# Patient Record
Sex: Female | Born: 1970 | Race: White | Hispanic: No | Smoking: Former smoker
Health system: Southern US, Community
[De-identification: ages and names within clinical notes are randomized; demographics above are authoritative.]

## PROBLEM LIST (undated history)

## (undated) DIAGNOSIS — Z789 Other specified health status: Secondary | ICD-10-CM

## (undated) HISTORY — PX: NO PAST SURGERIES: SHX2092

## (undated) HISTORY — PX: BREAST EXCISIONAL BIOPSY: SUR124

---

## 1999-07-17 ENCOUNTER — Other Ambulatory Visit: Admission: RE | Admit: 1999-07-17 | Discharge: 1999-07-17 | Payer: Self-pay | Admitting: Obstetrics and Gynecology

## 2000-07-18 ENCOUNTER — Other Ambulatory Visit: Admission: RE | Admit: 2000-07-18 | Discharge: 2000-07-18 | Payer: Self-pay | Admitting: Obstetrics and Gynecology

## 2001-07-24 ENCOUNTER — Other Ambulatory Visit: Admission: RE | Admit: 2001-07-24 | Discharge: 2001-07-24 | Payer: Self-pay | Admitting: Obstetrics and Gynecology

## 2002-06-17 ENCOUNTER — Inpatient Hospital Stay (HOSPITAL_COMMUNITY): Admission: AD | Admit: 2002-06-17 | Discharge: 2002-06-19 | Payer: Self-pay | Admitting: Obstetrics and Gynecology

## 2002-07-15 ENCOUNTER — Other Ambulatory Visit: Admission: RE | Admit: 2002-07-15 | Discharge: 2002-07-15 | Payer: Self-pay | Admitting: Obstetrics and Gynecology

## 2004-01-31 ENCOUNTER — Other Ambulatory Visit: Admission: RE | Admit: 2004-01-31 | Discharge: 2004-01-31 | Payer: Self-pay | Admitting: Obstetrics and Gynecology

## 2005-04-15 ENCOUNTER — Other Ambulatory Visit: Admission: RE | Admit: 2005-04-15 | Discharge: 2005-04-15 | Payer: Self-pay | Admitting: Obstetrics and Gynecology

## 2011-09-25 ENCOUNTER — Encounter (HOSPITAL_COMMUNITY): Payer: Self-pay | Admitting: Pharmacist

## 2011-10-03 ENCOUNTER — Encounter (HOSPITAL_COMMUNITY): Payer: Self-pay

## 2011-10-03 ENCOUNTER — Encounter (HOSPITAL_COMMUNITY)
Admission: RE | Admit: 2011-10-03 | Discharge: 2011-10-03 | Disposition: A | Discharge status: Home / Self Care | Payer: Managed Care, Other (non HMO) | Source: Ambulatory Visit | Attending: Obstetrics and Gynecology | Admitting: Obstetrics and Gynecology

## 2011-10-03 HISTORY — DX: Other specified health status: Z78.9

## 2011-10-03 LAB — CBC
HCT: 41.5 % (ref 36.0–46.0)
Hemoglobin: 13.8 g/dL (ref 12.0–15.0)
MCH: 29.4 pg (ref 26.0–34.0)
MCHC: 33.3 g/dL (ref 30.0–36.0)
MCV: 88.3 fL (ref 78.0–100.0)

## 2011-10-03 NOTE — Patient Instructions (Addendum)
20 Angelica Dunn  10/03/2011   Your procedure is scheduled on:  10/09/11  Enter through the Main Entrance of [facility] at 1000 AM.  Pick up the phone at the desk and dial [phone].   Call this number if you have problems the morning of surgery: [phone]   Remember:   Do not eat food:After Midnight.  Do not drink clear liquids: After Midnight.  Take these medicines the morning of surgery with A SIP OF WATER: NA   Do not wear jewelry, make-up or nail polish.  Do not wear lotions, powders, or perfumes. You may wear deodorant.  Do not shave 48 hours prior to surgery.  Do not bring valuables to the hospital.  Contacts, dentures or bridgework may not be worn into surgery.  Leave suitcase in the car. After surgery it may be brought to your room.  For patients admitted to the hospital, checkout time is 11:00 AM the day of discharge.   Patients discharged the day of surgery will not be allowed to drive home.  Name and phone number of your driver: NA  Special Instructions: CHG Shower Use Special Wash: 1/2 bottle night before surgery and 1/2 bottle morning of surgery.   Please read over the following fact sheets that you were given: MRSA Information

## 2011-10-07 ENCOUNTER — Other Ambulatory Visit: Payer: Self-pay | Admitting: Obstetrics and Gynecology

## 2011-10-07 NOTE — H&P (Signed)
41 y.o. yo complains of sui.  She has some signs of mixed incontinence and has been on vessicare but still leaks with cough, sneeze and exercise.  It affects her daily life.  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  She also has dysmenorrhea and cystocele.  Past Medical History  Diagnosis Date  . No pertinent past medical history    Past Surgical History  Procedure Date  . No past surgeries     History   Social History  . Marital Status: Married    Spouse Name: N/A    Number of Children: N/A  . Years of Education: N/A   Occupational History  . Not on file.   Social History Main Topics  . Smoking status: Former Games developer  . Smokeless tobacco: Not on file  . Alcohol Use: Yes     occasional wine  . Drug Use: No  . Sexually Active:    Other Topics Concern  . Not on file   Social History Narrative  . No narrative on file    No current facility-administered medications on file prior to encounter.   No current outpatient prescriptions on file prior to encounter.    No Known Allergies  @VITALS2 @  Lungs: clear to ascultation Cor:  RRR Abdomen:  soft, nontender, nondistended. Ex:  no cords, erythema Pelvic:  nml uterus, mobile and decensus, cystocele -1 and 46 degree urethra.  U/S 7x4x4, normal ovaries  Cystometrics:  LPP 106, normal compliance, detrusor stable.  A:  SUI, cystocele, dysmenorrhea.   P:  For TVH, TVT, cysto and A repair.   All risks, benefits and alternatives d/w patient and she desires to proceed.  Patient will receive preop antibiotics and SCDs during the operation.     Buzz Axel A  Jahara Dail A

## 2011-10-08 MED ORDER — CEFAZOLIN SODIUM-DEXTROSE 2-3 GM-% IV SOLR: 2.0000 g | INTRAVENOUS | Status: DC

## 2011-10-09 ENCOUNTER — Encounter (HOSPITAL_COMMUNITY): Payer: Self-pay | Admitting: *Deleted

## 2011-10-09 ENCOUNTER — Ambulatory Visit (HOSPITAL_COMMUNITY): Payer: Managed Care, Other (non HMO)

## 2011-10-09 ENCOUNTER — Encounter (HOSPITAL_COMMUNITY): Payer: Self-pay

## 2011-10-09 ENCOUNTER — Encounter (HOSPITAL_COMMUNITY)
Admission: RE | Disposition: A | Discharge status: Home / Self Care | Payer: Self-pay | Source: Ambulatory Visit | Attending: Obstetrics and Gynecology

## 2011-10-09 ENCOUNTER — Ambulatory Visit (HOSPITAL_COMMUNITY)
Admission: RE | Admit: 2011-10-09 | Discharge: 2011-10-10 | Disposition: A | Discharge status: Home / Self Care | Payer: Managed Care, Other (non HMO) | Source: Ambulatory Visit | Attending: Obstetrics and Gynecology | Admitting: Obstetrics and Gynecology

## 2011-10-09 DIAGNOSIS — N946 Dysmenorrhea, unspecified: Secondary | ICD-10-CM | POA: Insufficient documentation

## 2011-10-09 DIAGNOSIS — Z9889 Other specified postprocedural states: Secondary | ICD-10-CM

## 2011-10-09 DIAGNOSIS — N920 Excessive and frequent menstruation with regular cycle: Secondary | ICD-10-CM | POA: Insufficient documentation

## 2011-10-09 DIAGNOSIS — N8111 Cystocele, midline: Secondary | ICD-10-CM | POA: Insufficient documentation

## 2011-10-09 DIAGNOSIS — N393 Stress incontinence (female) (male): Secondary | ICD-10-CM | POA: Insufficient documentation

## 2011-10-09 HISTORY — PX: CYSTOCELE REPAIR: SHX163

## 2011-10-09 HISTORY — PX: BLADDER SUSPENSION: SHX72

## 2011-10-09 HISTORY — PX: CYSTOSCOPY: SHX5120

## 2011-10-09 HISTORY — PX: VAGINAL HYSTERECTOMY: SHX2639

## 2011-10-09 SURGERY — TRANSVAGINAL TAPE (TVT) PROCEDURE
Anesthesia: General | Site: Vagina | Wound class: Clean Contaminated

## 2011-10-09 MED ORDER — MEPERIDINE HCL 25 MG/ML IJ SOLN: 6.2500 mg | INTRAMUSCULAR | Status: DC | PRN

## 2011-10-09 MED ORDER — ZOLPIDEM TARTRATE 5 MG PO TABS: 5.0000 mg | ORAL_TABLET | Freq: Every evening | ORAL | Status: DC | PRN

## 2011-10-09 MED ORDER — KETOROLAC TROMETHAMINE 30 MG/ML IJ SOLN: 30.0000 mg | Freq: Four times a day (QID) | INTRAMUSCULAR | Status: DC

## 2011-10-09 MED ORDER — DEXAMETHASONE SODIUM PHOSPHATE 4 MG/ML IJ SOLN
INTRAMUSCULAR | Status: DC | PRN
  Administered 2011-10-09: 10 mg via INTRAVENOUS

## 2011-10-09 MED ORDER — HYDROMORPHONE HCL PF 1 MG/ML IJ SOLN
INTRAMUSCULAR | Status: AC
  Administered 2011-10-09: 0.5 mg via INTRAVENOUS
  Filled 2011-10-09: qty 1

## 2011-10-09 MED ORDER — FENTANYL CITRATE 0.05 MG/ML IJ SOLN
INTRAMUSCULAR | Status: DC | PRN
  Administered 2011-10-09: 50 ug via INTRAVENOUS
  Administered 2011-10-09: 25 ug via INTRAVENOUS
  Administered 2011-10-09: 75 ug via INTRAVENOUS
  Administered 2011-10-09 (×2): 50 ug via INTRAVENOUS

## 2011-10-09 MED ORDER — NEOSTIGMINE METHYLSULFATE 1 MG/ML IJ SOLN
INTRAMUSCULAR | Status: AC
  Filled 2011-10-09: qty 10

## 2011-10-09 MED ORDER — INDIGOTINDISULFONATE SODIUM 8 MG/ML IJ SOLN
INTRAMUSCULAR | Status: AC
  Filled 2011-10-09: qty 5

## 2011-10-09 MED ORDER — IBUPROFEN 800 MG PO TABS
800.0000 mg | ORAL_TABLET | Freq: Three times a day (TID) | ORAL | Status: DC | PRN
  Administered 2011-10-09 – 2011-10-10 (×2): 800 mg via ORAL
  Filled 2011-10-09 (×2): qty 1

## 2011-10-09 MED ORDER — PROPOFOL 10 MG/ML IV EMUL
INTRAVENOUS | Status: AC
  Filled 2011-10-09: qty 20

## 2011-10-09 MED ORDER — FENTANYL CITRATE 0.05 MG/ML IJ SOLN
INTRAMUSCULAR | Status: AC
  Filled 2011-10-09: qty 2

## 2011-10-09 MED ORDER — MENTHOL 3 MG MT LOZG
1.0000 | LOZENGE | OROMUCOSAL | Status: DC | PRN
  Administered 2011-10-10: 3 mg via ORAL
  Filled 2011-10-09: qty 9

## 2011-10-09 MED ORDER — ROCURONIUM BROMIDE 50 MG/5ML IV SOLN
INTRAVENOUS | Status: AC
  Filled 2011-10-09: qty 1

## 2011-10-09 MED ORDER — GLYCOPYRROLATE 0.2 MG/ML IJ SOLN
INTRAMUSCULAR | Status: DC | PRN
  Administered 2011-10-09: 1 mg via INTRAVENOUS

## 2011-10-09 MED ORDER — BUTORPHANOL TARTRATE 1 MG/ML IJ SOLN
2.0000 mg | INTRAMUSCULAR | Status: DC | PRN
  Administered 2011-10-09 (×2): 2 mg via INTRAVENOUS
  Filled 2011-10-09 (×2): qty 2

## 2011-10-09 MED ORDER — INDIGOTINDISULFONATE SODIUM 8 MG/ML IJ SOLN
INTRAMUSCULAR | Status: DC | PRN
  Administered 2011-10-09: 40 mg via INTRAVENOUS

## 2011-10-09 MED ORDER — SCOPOLAMINE 1 MG/3DAYS TD PT72
MEDICATED_PATCH | TRANSDERMAL | Status: AC
  Filled 2011-10-09: qty 1

## 2011-10-09 MED ORDER — HYDROMORPHONE HCL PF 1 MG/ML IJ SOLN
0.2500 mg | INTRAMUSCULAR | Status: DC | PRN
  Administered 2011-10-09 (×3): 0.5 mg via INTRAVENOUS

## 2011-10-09 MED ORDER — LIDOCAINE-EPINEPHRINE (PF) 1 %-1:200000 IJ SOLN
INTRAMUSCULAR | Status: AC
  Filled 2011-10-09: qty 10

## 2011-10-09 MED ORDER — DOCUSATE SODIUM 100 MG PO CAPS: 100.0000 mg | ORAL_CAPSULE | Freq: Every day | ORAL | Status: DC

## 2011-10-09 MED ORDER — GLYCOPYRROLATE 0.2 MG/ML IJ SOLN
INTRAMUSCULAR | Status: AC
  Filled 2011-10-09: qty 3

## 2011-10-09 MED ORDER — ONDANSETRON HCL 4 MG PO TABS: 4.0000 mg | ORAL_TABLET | Freq: Four times a day (QID) | ORAL | Status: DC | PRN

## 2011-10-09 MED ORDER — MIDAZOLAM HCL 2 MG/2ML IJ SOLN
INTRAMUSCULAR | Status: AC
  Filled 2011-10-09: qty 2

## 2011-10-09 MED ORDER — PROPOFOL 10 MG/ML IV EMUL
INTRAVENOUS | Status: DC | PRN
  Administered 2011-10-09: 200 mg via INTRAVENOUS

## 2011-10-09 MED ORDER — STERILE WATER FOR IRRIGATION IR SOLN
Status: DC | PRN
  Administered 2011-10-09: 1500 mL via INTRAVESICAL

## 2011-10-09 MED ORDER — METOCLOPRAMIDE HCL 5 MG/ML IJ SOLN
INTRAMUSCULAR | Status: AC
  Administered 2011-10-09: 10 mg via INTRAVENOUS
  Filled 2011-10-09: qty 2

## 2011-10-09 MED ORDER — ESTRADIOL 0.1 MG/GM VA CREA
TOPICAL_CREAM | VAGINAL | Status: DC | PRN
  Administered 2011-10-09: 1 via VAGINAL

## 2011-10-09 MED ORDER — METOCLOPRAMIDE HCL 5 MG/ML IJ SOLN
10.0000 mg | Freq: Once | INTRAMUSCULAR | Status: AC | PRN
  Administered 2011-10-09: 10 mg via INTRAVENOUS

## 2011-10-09 MED ORDER — OXYCODONE-ACETAMINOPHEN 5-325 MG PO TABS
1.0000 | ORAL_TABLET | ORAL | Status: DC | PRN
  Administered 2011-10-09: 1 via ORAL
  Administered 2011-10-10: 2 via ORAL
  Administered 2011-10-10: 1 via ORAL
  Administered 2011-10-10: 2 via ORAL
  Filled 2011-10-09 (×3): qty 1
  Filled 2011-10-09: qty 2
  Filled 2011-10-09: qty 1

## 2011-10-09 MED ORDER — LIDOCAINE-EPINEPHRINE 0.5 %-1:200000 IJ SOLN
INTRAMUSCULAR | Status: AC
  Filled 2011-10-09: qty 1

## 2011-10-09 MED ORDER — LIDOCAINE HCL (CARDIAC) 20 MG/ML IV SOLN
INTRAVENOUS | Status: AC
  Filled 2011-10-09: qty 5

## 2011-10-09 MED ORDER — NEOSTIGMINE METHYLSULFATE 1 MG/ML IJ SOLN
INTRAMUSCULAR | Status: DC | PRN
  Administered 2011-10-09: 5 mg via INTRAVENOUS

## 2011-10-09 MED ORDER — CEFAZOLIN SODIUM-DEXTROSE 2-3 GM-% IV SOLR
INTRAVENOUS | Status: AC
  Administered 2011-10-09: 2 g via INTRAVENOUS
  Filled 2011-10-09: qty 50

## 2011-10-09 MED ORDER — LIDOCAINE-EPINEPHRINE 0.5 %-1:200000 IJ SOLN
INTRAMUSCULAR | Status: DC | PRN
  Administered 2011-10-09: 6 mL
  Administered 2011-10-09: 4 mL

## 2011-10-09 MED ORDER — ESTRADIOL 0.1 MG/GM VA CREA
TOPICAL_CREAM | VAGINAL | Status: AC
  Filled 2011-10-09: qty 42.5

## 2011-10-09 MED ORDER — DEXAMETHASONE SODIUM PHOSPHATE 10 MG/ML IJ SOLN
INTRAMUSCULAR | Status: AC
  Filled 2011-10-09: qty 1

## 2011-10-09 MED ORDER — MIDAZOLAM HCL 5 MG/5ML IJ SOLN
INTRAMUSCULAR | Status: DC | PRN
  Administered 2011-10-09: 2 mg via INTRAVENOUS

## 2011-10-09 MED ORDER — LIDOCAINE HCL (CARDIAC) 20 MG/ML IV SOLN
INTRAVENOUS | Status: DC | PRN
  Administered 2011-10-09: 20 mg via INTRAVENOUS

## 2011-10-09 MED ORDER — SCOPOLAMINE 1 MG/3DAYS TD PT72
1.0000 | MEDICATED_PATCH | TRANSDERMAL | Status: DC
  Administered 2011-10-09: 1.5 mg via TRANSDERMAL

## 2011-10-09 MED ORDER — HYDROMORPHONE HCL PF 1 MG/ML IJ SOLN
INTRAMUSCULAR | Status: AC
  Administered 2011-10-09: 0.5 mg
  Filled 2011-10-09: qty 1

## 2011-10-09 MED ORDER — KETOROLAC TROMETHAMINE 30 MG/ML IJ SOLN
INTRAMUSCULAR | Status: AC
  Filled 2011-10-09: qty 1

## 2011-10-09 MED ORDER — ONDANSETRON HCL 4 MG/2ML IJ SOLN
INTRAMUSCULAR | Status: DC | PRN
  Administered 2011-10-09: 4 mg via INTRAVENOUS

## 2011-10-09 MED ORDER — LACTATED RINGERS IV SOLN
INTRAVENOUS | Status: DC
  Administered 2011-10-09 (×4): via INTRAVENOUS

## 2011-10-09 MED ORDER — ROCURONIUM BROMIDE 100 MG/10ML IV SOLN
INTRAVENOUS | Status: DC | PRN
  Administered 2011-10-09: 35 mg via INTRAVENOUS
  Administered 2011-10-09: 15 mg via INTRAVENOUS

## 2011-10-09 MED ORDER — ONDANSETRON HCL 4 MG/2ML IJ SOLN: 4.0000 mg | Freq: Four times a day (QID) | INTRAMUSCULAR | Status: DC | PRN

## 2011-10-09 MED ORDER — FENTANYL CITRATE 0.05 MG/ML IJ SOLN
INTRAMUSCULAR | Status: AC
  Filled 2011-10-09: qty 5

## 2011-10-09 MED ORDER — ONDANSETRON HCL 4 MG/2ML IJ SOLN
INTRAMUSCULAR | Status: AC
  Filled 2011-10-09: qty 2

## 2011-10-09 MED ORDER — KETOROLAC TROMETHAMINE 30 MG/ML IJ SOLN
INTRAMUSCULAR | Status: DC | PRN
  Administered 2011-10-09: 30 mg via INTRAVENOUS

## 2011-10-09 SURGICAL SUPPLY — 43 items
BLADE SURG 15 STRL LF C SS BP (BLADE) ×2 IMPLANT
BLADE SURG 15 STRL SS (BLADE) ×1
CANISTER SUCTION 2500CC (MISCELLANEOUS) ×3 IMPLANT
CATH ROBINSON RED A/P 16FR (CATHETERS) ×3 IMPLANT
CLOTH BEACON ORANGE TIMEOUT ST (SAFETY) ×3 IMPLANT
CONT PATH 16OZ SNAP LID 3702 (MISCELLANEOUS) ×3 IMPLANT
DECANTER SPIKE VIAL GLASS SM (MISCELLANEOUS) ×3 IMPLANT
DERMABOND ADVANCED (GAUZE/BANDAGES/DRESSINGS) ×1
DERMABOND ADVANCED .7 DNX12 (GAUZE/BANDAGES/DRESSINGS) ×2 IMPLANT
DRAPE HYSTEROSCOPY (DRAPE) ×3 IMPLANT
FORMULA 20CAL 3 OZ MEAD (FORMULA) ×6 IMPLANT
GAUZE PACKING 2X5 YD STERILE (GAUZE/BANDAGES/DRESSINGS) ×3 IMPLANT
GLOVE BIO SURGEON STRL SZ7 (GLOVE) ×6 IMPLANT
GLOVE BIO SURGEON STRL SZ7.5 (GLOVE) ×6 IMPLANT
GLOVE BIOGEL PI IND STRL 6.5 (GLOVE) ×2 IMPLANT
GLOVE BIOGEL PI IND STRL 7.5 (GLOVE) ×2 IMPLANT
GLOVE BIOGEL PI INDICATOR 6.5 (GLOVE) ×1
GLOVE BIOGEL PI INDICATOR 7.5 (GLOVE) ×1
GOWN STRL REIN 2XL LVL4 (GOWN DISPOSABLE) ×3 IMPLANT
GOWN STRL REIN XL XLG (GOWN DISPOSABLE) ×15 IMPLANT
NEEDLE HYPO 22GX1.5 SAFETY (NEEDLE) ×3 IMPLANT
NS IRRIG 1000ML POUR BTL (IV SOLUTION) ×3 IMPLANT
PACK VAGINAL WOMENS (CUSTOM PROCEDURE TRAY) ×3 IMPLANT
PENCIL BUTTON HOLSTER BLD 10FT (ELECTRODE) ×3 IMPLANT
PLUG CATH AND CAP STER (CATHETERS) ×3 IMPLANT
SET CYSTO W/LG BORE CLAMP LF (SET/KITS/TRAYS/PACK) ×3 IMPLANT
SLING TRANS VAGINAL TAPE (Sling) ×1 IMPLANT
SLING UTERINE/ABD GYNECARE TVT (Sling) ×2 IMPLANT
SURGIFLO W/THROMBIN 8M KIT (HEMOSTASIS) ×3 IMPLANT
SUT VIC AB 0 CT1 18XCR BRD8 (SUTURE) ×4 IMPLANT
SUT VIC AB 0 CT1 8-18 (SUTURE) ×2
SUT VIC AB 2-0 CT1 (SUTURE) ×6 IMPLANT
SUT VIC AB 2-0 CT1 27 (SUTURE) ×2
SUT VIC AB 2-0 CT1 TAPERPNT 27 (SUTURE) ×4 IMPLANT
SUT VIC AB 2-0 SH 27 (SUTURE) ×2
SUT VIC AB 2-0 SH 27XBRD (SUTURE) ×4 IMPLANT
SUT VIC AB 2-0 UR6 27 (SUTURE) IMPLANT
SUT VICRYL 0 TIES 12 18 (SUTURE) ×3 IMPLANT
SYR 50ML LL SCALE MARK (SYRINGE) ×3 IMPLANT
SYRINGE TOOMEY DISP (SYRINGE) ×3 IMPLANT
TOWEL OR 17X24 6PK STRL BLUE (TOWEL DISPOSABLE) ×6 IMPLANT
TRAY FOLEY CATH 14FR (SET/KITS/TRAYS/PACK) ×3 IMPLANT
WATER STERILE IRR 1000ML POUR (IV SOLUTION) ×3 IMPLANT

## 2011-10-09 NOTE — Anesthesia Procedure Notes (Signed)
Procedure Name: Intubation Date/Time: 10/09/2011 1:35 PM Performed by: Lincoln Brigham Pre-anesthesia Checklist: Patient identified, Timeout performed, Emergency Drugs available, Suction available and Patient being monitored Patient Re-evaluated:Patient Re-evaluated prior to inductionOxygen Delivery Method: Circle system utilized Preoxygenation: Pre-oxygenation with 100% oxygen Intubation Type: IV induction Ventilation: Mask ventilation without difficulty Grade View: Grade I Tube type: Oral Tube size: 7.0 mm Airway Equipment and Method: Stylet Placement Confirmation: ETT inserted through vocal cords under direct vision,  positive ETCO2 and breath sounds checked- equal and bilateral Secured at: 22 cm Tube secured with: Tape Dental Injury: Teeth and Oropharynx as per pre-operative assessment

## 2011-10-09 NOTE — Brief Op Note (Signed)
10/09/2011  2:59 PM  PATIENT:  Angelica Dunn  41 y.o. female  PRE-OPERATIVE DIAGNOSIS:  Stress Urinary Incontinence; Cystocele  POST-OPERATIVE DIAGNOSIS:  Stress Urinary Incontinence; Cystocele  PROCEDURE:  Procedure(s) (LRB): TRANSVAGINAL TAPE (TVT) PROCEDURE (N/A) ANTERIOR REPAIR (CYSTOCELE) (N/A) HYSTERECTOMY VAGINAL (N/A) CYSTOSCOPY (N/A)  SURGEON:  Surgeon(s) and Role:    * Miriam Kestler A Jah Alarid, MD - Primary    * Allan Ross, MD - Assisting  ANESTHESIA:   general  EBL:  Total I/O In: 2000 [I.V.:2000] Out: 250 [Blood:250]   SPECIMEN:  Source of Specimen:  uterus, cervix  DISPOSITION OF SPECIMEN:  PATHOLOGY  COUNTS:  YES  PLAN OF CARE: Admit for overnight observation  PATIENT DISPOSITION:  PACU - hemodynamically stable.   Delay start of Pharmacological VTE agent (>24hrs) due to surgical blood loss or risk of bleeding: na  Meds:  Similac in bladder, lidocaine with epi, indigo carmine. Surgiflo and estrace.  Findings: cystocele to -1, 7 week size uterus and normal ovaries.  Technique:  After general anesthesia was achieved, the patient was prepped and draped in a sterile fashion.  The bladder was emptied with a red rubber catheter and 60 cc of Similac was placed in the bladder. The cervix was grasped with a pair of Lahey clamps and injected circumferentially with 1% lidocaine with epi. A circumferential incision was made around the cervix with the scalpel at the level of the reflection of the vagina onto the cervix and the posterior cul-de-sac was entered into with Mayo scissors. The long billed duckbill retractor was then placed and the bladder was removed off the cervix carefully with sharp dissection with the Metzenbaums. The the uterosacrals were grasped with a pair heney clamps on either side and secured with a Heaney stitch of 0 Vicryl. Cardinal ligament was then divided with alternating successive bites of the Heaney clamp followed by incision with the Mayo scissors  and secured with stitches of 0 Vicryl at the level of the cornua Heaneys were placed bilaterally around the entire pedicle and the uterus was able to be amputated. The pedicles were secured with a free hand stitch of 0 Vicryl followed by a stitch of 0 Vicryl bilaterally. An additional stitch on the patient's left-hand side was placed to ensure hemostasis. Once hemostasis was achieved the peritoneum was closed in a pursestring fashion including the bilateral uterosacrals and posteriorly in and out of the vagina and a partial Halbans culdoplasty.  The stitch was pulled shot closing the peritoneum and pulling the uterosacrals together through the modified Hall bands and through the vagina. The Foley was placed at this point.  The anterior vaginal mucosa was then entered into in the midline with the help of Alice's after being injected with 1% lidocaine with epi with the scalpel. The vaginal mucosa was then reflected off of the vesicouterine fascia with careful sharp dissection with the Metzenbaums until the pelvic floor could feel be felt underneath the pubic bone. 2 small stab incisions are made on 2 cm either side of the midline just above the pubic bone. The abdominal needles were placed carefully through the pelvic floor and out the vagina, following close behind the pubic bone. Cystoscopy was performed and revealed bilateral spillage of indigo carmine and no needles in the bladder.  A Kelly was then used to ensure that the there was no tension placed on the tape and the abdominal needles pulled the tape in place. Once I was satisfied that the sling was in the correct place and at   no tension, the anterior repair was done with 2 mattress stitches of 0 Vicryl. Hemostasis was achieved with the Bovie cautery and some Surgiflo. The vaginal mucosa was trimmed and closed with a running locked stitch of 2-0 Vicryl. The cuff was then closed with interrupted figure-of-eight stitches of 0 Vicryl. A vaginal pack was placed  inside the vagina with Estrace cream and the Foley had been replaced patient tolerated the procedure was returned to recovery in stable condition.  Kellen Hover A       

## 2011-10-09 NOTE — Anesthesia Preprocedure Evaluation (Addendum)
Anesthesia Evaluation  Patient identified by MRN, date of birth, ID band Patient awake    Reviewed: Allergy & Precautions, H&P , NPO status , Patient's Chart, lab work & pertinent test results  Airway Mallampati: II TM Distance: >3 FB Neck ROM: full    Dental No notable dental hx. (+) Teeth Intact   Pulmonary neg pulmonary ROS,  breath sounds clear to auscultation  Pulmonary exam normal       Cardiovascular negative cardio ROS  Rhythm:regular Rate:Normal     Neuro/Psych negative neurological ROS  negative psych ROS   GI/Hepatic negative GI ROS, Neg liver ROS,   Endo/Other  negative endocrine ROS  Renal/GU   negative genitourinary   Musculoskeletal   Abdominal Normal abdominal exam  (+)   Peds  Hematology negative hematology ROS (+)   Anesthesia Other Findings   Reproductive/Obstetrics negative OB ROS                           Anesthesia Physical Anesthesia Plan  ASA: I  Anesthesia Plan: General ETT   Post-op Pain Management:    Induction: Intravenous  Airway Management Planned: Oral ETT  Additional Equipment:   Intra-op Plan:   Post-operative Plan: Extubation in OR  Informed Consent: I have reviewed the patients History and Physical, chart, labs and discussed the procedure including the risks, benefits and alternatives for the proposed anesthesia with the patient or authorized representative who has indicated his/her understanding and acceptance.   Dental advisory given  Plan Discussed with: Anesthesiologist, CRNA and Surgeon  Anesthesia Plan Comments:        Anesthesia Quick Evaluation

## 2011-10-09 NOTE — Transfer of Care (Signed)
Immediate Anesthesia Transfer of Care Note  Patient: Angelica Dunn  Procedure(s) Performed: Procedure(s) (LRB): TRANSVAGINAL TAPE (TVT) PROCEDURE (N/A) ANTERIOR REPAIR (CYSTOCELE) (N/A) HYSTERECTOMY VAGINAL (N/A) CYSTOSCOPY (N/A)  Patient Location: PACU  Anesthesia Type: General  Level of Consciousness: awake, alert  and oriented  Airway & Oxygen Therapy: Patient Spontanous Breathing and Patient connected to nasal cannula oxygen  Post-op Assessment: Report given to PACU RN and Post -op Vital signs reviewed and stable  Post vital signs: Reviewed and stable  Complications: No apparent anesthesia complications

## 2011-10-09 NOTE — Progress Notes (Signed)
There has been no change in the patients history, status or exam since the history and physical.  Filed Vitals:   10/09/11 1004  BP: 107/66  Pulse: 58  Temp: 99.1 F (37.3 C)  TempSrc: Oral  Resp: 16  SpO2: 100%    Lab Results  Component Value Date   WBC 7.7 10/03/2011   HGB 13.8 10/03/2011   HCT 41.5 10/03/2011   MCV 88.3 10/03/2011   PLT 274 10/03/2011    Ariane Ditullio A

## 2011-10-09 NOTE — Progress Notes (Signed)
Patient is eating, ambulating, not voiding-foley in.  Pain control is good.  BP 122/78  Pulse 75  Temp 98.2 F (36.8 C) (Oral)  Resp 20  SpO2 100%    Lab Results  Component Value Date   WBC 7.7 10/03/2011   HGB 13.8 10/03/2011   HCT 41.5 10/03/2011   MCV 88.3 10/03/2011   PLT 274 10/03/2011    A/P  Routine care.  Expect d/c per plan.

## 2011-10-09 NOTE — Op Note (Signed)
10/09/2011  2:59 PM  PATIENT:  Angelica Dunn  41 y.o. female  PRE-OPERATIVE DIAGNOSIS:  Stress Urinary Incontinence; Cystocele  POST-OPERATIVE DIAGNOSIS:  Stress Urinary Incontinence; Cystocele  PROCEDURE:  Procedure(s) (LRB): TRANSVAGINAL TAPE (TVT) PROCEDURE (N/A) ANTERIOR REPAIR (CYSTOCELE) (N/A) HYSTERECTOMY VAGINAL (N/A) CYSTOSCOPY (N/A)  SURGEON:  Surgeon(s) and Role:    * Loney Laurence, MD - Primary    * Miguel Aschoff, MD - Assisting  ANESTHESIA:   general  EBL:  Total I/O In: 2000 [I.V.:2000] Out: 250 [Blood:250]   SPECIMEN:  Source of Specimen:  uterus, cervix  DISPOSITION OF SPECIMEN:  PATHOLOGY  COUNTS:  YES  PLAN OF CARE: Admit for overnight observation  PATIENT DISPOSITION:  PACU - hemodynamically stable.   Delay start of Pharmacological VTE agent (>24hrs) due to surgical blood loss or risk of bleeding: na  Meds:  Similac in bladder, lidocaine with epi, indigo carmine. Surgiflo and estrace.  Findings: cystocele to -1, 7 week size uterus and normal ovaries.  Technique:  After general anesthesia was achieved, the patient was prepped and draped in a sterile fashion.  The bladder was emptied with a red rubber catheter and 60 cc of Similac was placed in the bladder. The cervix was grasped with a pair of Lahey clamps and injected circumferentially with 1% lidocaine with epi. A circumferential incision was made around the cervix with the scalpel at the level of the reflection of the vagina onto the cervix and the posterior cul-de-sac was entered into with Mayo scissors. The long billed duckbill retractor was then placed and the bladder was removed off the cervix carefully with sharp dissection with the Metzenbaums. The the uterosacrals were grasped with a pair heney clamps on either side and secured with a Heaney stitch of 0 Vicryl. Cardinal ligament was then divided with alternating successive bites of the Heaney clamp followed by incision with the Mayo scissors  and secured with stitches of 0 Vicryl at the level of the cornua Heaneys were placed bilaterally around the entire pedicle and the uterus was able to be amputated. The pedicles were secured with a free hand stitch of 0 Vicryl followed by a stitch of 0 Vicryl bilaterally. An additional stitch on the patient's left-hand side was placed to ensure hemostasis. Once hemostasis was achieved the peritoneum was closed in a pursestring fashion including the bilateral uterosacrals and posteriorly in and out of the vagina and a partial Halbans culdoplasty.  The stitch was pulled shot closing the peritoneum and pulling the uterosacrals together through the modified Hall bands and through the vagina. The Foley was placed at this point.  The anterior vaginal mucosa was then entered into in the midline with the help of Alice's after being injected with 1% lidocaine with epi with the scalpel. The vaginal mucosa was then reflected off of the vesicouterine fascia with careful sharp dissection with the Metzenbaums until the pelvic floor could feel be felt underneath the pubic bone. 2 small stab incisions are made on 2 cm either side of the midline just above the pubic bone. The abdominal needles were placed carefully through the pelvic floor and out the vagina, following close behind the pubic bone. Cystoscopy was performed and revealed bilateral spillage of indigo carmine and no needles in the bladder.  A Tresa Endo was then used to ensure that the there was no tension placed on the tape and the abdominal needles pulled the tape in place. Once I was satisfied that the sling was in the correct place and at  no tension, the anterior repair was done with 2 mattress stitches of 0 Vicryl. Hemostasis was achieved with the Bovie cautery and some Surgiflo. The vaginal mucosa was trimmed and closed with a running locked stitch of 2-0 Vicryl. The cuff was then closed with interrupted figure-of-eight stitches of 0 Vicryl. A vaginal pack was placed  inside the vagina with Estrace cream and the Foley had been replaced patient tolerated the procedure was returned to recovery in stable condition.  Koda Defrank A

## 2011-10-09 NOTE — Anesthesia Postprocedure Evaluation (Signed)
  Anesthesia Post-op Note  Patient: Angelica Dunn  Procedure(s) Performed: Procedure(s) (LRB): TRANSVAGINAL TAPE (TVT) PROCEDURE (N/A) ANTERIOR REPAIR (CYSTOCELE) (N/A) HYSTERECTOMY VAGINAL (N/A) CYSTOSCOPY (N/A)  Patient Location: PACU  Anesthesia Type: General  Level of Consciousness: awake, alert  and oriented  Airway and Oxygen Therapy: Patient Spontanous Breathing  Post-op Pain: mild  Post-op Assessment: Post-op Vital signs reviewed, Patient's Cardiovascular Status Stable, Respiratory Function Stable, Patent Airway, No signs of Nausea or vomiting and Pain level controlled  Post-op Vital Signs: Reviewed and stable  Complications: No apparent anesthesia complications

## 2011-10-10 ENCOUNTER — Encounter (HOSPITAL_COMMUNITY): Payer: Self-pay | Admitting: Obstetrics and Gynecology

## 2011-10-10 LAB — CBC
Hemoglobin: 11.5 g/dL — ABNORMAL LOW (ref 12.0–15.0)
MCH: 29.3 pg (ref 26.0–34.0)
MCV: 88.3 fL (ref 78.0–100.0)
Platelets: 225 10*3/uL (ref 150–400)
RBC: 3.92 MIL/uL (ref 3.87–5.11)
WBC: 18.5 10*3/uL — ABNORMAL HIGH (ref 4.0–10.5)

## 2011-10-10 MED ORDER — CEFUROXIME AXETIL 250 MG PO TABS: 250.0000 mg | ORAL_TABLET | Freq: Two times a day (BID) | ORAL | Status: AC

## 2011-10-10 MED ORDER — OXYCODONE-ACETAMINOPHEN 5-325 MG PO TABS: 1.0000 | ORAL_TABLET | ORAL | Status: AC | PRN

## 2011-10-10 NOTE — Progress Notes (Signed)
Patient is eating, ambulating, foley in place.  Pain control is good.  BP 114/68  Pulse 92  Temp 98.6 F (37 C) (Oral)  Resp 18  Ht 5\' 2"  (1.575 m)  Wt 55.792 kg (123 lb)  BMI 22.50 kg/m2  SpO2 100%  lungs:   clear to auscultation cor:    RRR Abdomen:  soft, appropriate tenderness, incisions intact and without erythema or exudate. ex:    no cords   Lab Results  Component Value Date   WBC 18.5* 10/10/2011   HGB 11.5* 10/10/2011   HCT 34.6* 10/10/2011   MCV 88.3 10/10/2011   PLT 225 10/10/2011    A/P  Routine care.  Expect d/c per plan.

## 2011-10-10 NOTE — Progress Notes (Signed)
Pt out in wheelchair  Husband   With pt     Teaching complete

## 2011-10-10 NOTE — Addendum Note (Signed)
Addendum  created 10/10/11 1048 by Algis Greenhouse, CRNA   Modules edited:Notes Section

## 2011-10-10 NOTE — Discharge Summary (Signed)
Physician Discharge Summary  Patient ID: Angelica Dunn MRN: [id number] DOB/AGE: 41-01-1971 41 y.o.  Admit date: 10/09/2011 Discharge date: 10/10/2011  Admission Diagnoses:SUI, cystocele, menorrhagia  Discharge Diagnoses: same Active Problems:  * No active hospital problems. *    Discharged Condition: good  Hospital Course: uncomplicated postoperative course after surgery.  Home with foley and antibiotics.    Consults: None  Significant Diagnostic Studies: labs:   Lab Results  Component Value Date   WBC 18.5* 10/10/2011   HGB 11.5* 10/10/2011   HCT 34.6* 10/10/2011   MCV 88.3 10/10/2011   PLT 225 10/10/2011     Treatments: surgery: TVH/TVT/A repair/cysto  Discharge Exam: Blood pressure 114/68, pulse 92, temperature 98.6 F (37 C), temperature source Oral, resp. rate 18, height 5\' 2"  (1.575 m), weight 55.792 kg (123 lb), SpO2 100.00%.   Disposition:   Discharge Orders    Future Orders Please Complete By Expires   Diet - low sodium heart healthy      Discharge instructions      Comments:   No driving on narcotics, no sexual activity for 2 weeks.   Increase activity slowly      May shower / Bathe      Comments:   Shower, no bath for 2 weeks.   Sexual Activity Restrictions      Comments:   No sexual activity for 2 weeks.   Remove dressing in 24 hours      Call MD for:  temperature >100.4        Medication List  As of 10/10/2011  7:56 AM   TAKE these medications         docusate sodium 100 MG capsule   Commonly known as: COLACE   Take 100 mg by mouth daily.      oxyCODONE-acetaminophen 5-325 MG per tablet   Commonly known as: PERCOCET/ROXICET   Take 1-2 tablets by mouth every 4 (four) hours as needed (moderate to severe pain (when tolerating fluids)).           Follow-up Information    Follow up with Curtez Brallier A, MD. Schedule an appointment as soon as possible for a visit in 3 days. (pt has appt mon for voiding trial)    Contact information:   [address] [place] [address] [phone]          Signed: Loney Laurence 10/10/2011, 7:56 AM

## 2011-10-10 NOTE — Progress Notes (Signed)
Vag pack removed.

## 2011-10-10 NOTE — Anesthesia Postprocedure Evaluation (Signed)
  Anesthesia Post-op Note  Patient: Angelica Dunn  Procedure(s) Performed: Procedure(s) (LRB): TRANSVAGINAL TAPE (TVT) PROCEDURE (N/A) ANTERIOR REPAIR (CYSTOCELE) (N/A) HYSTERECTOMY VAGINAL (N/A) CYSTOSCOPY (N/A)  Patient Location: [facility]  Anesthesia Type: General  Level of Consciousness: awake  Airway and Oxygen Therapy: Patient Spontanous Breathing  Post-op Pain: mild  Post-op Assessment: Post-op Vital signs reviewed  Post-op Vital Signs: stable  Complications: No apparent anesthesia complications

## 2012-12-15 ENCOUNTER — Other Ambulatory Visit: Payer: Self-pay | Admitting: Obstetrics and Gynecology

## 2014-04-19 ENCOUNTER — Other Ambulatory Visit: Payer: Self-pay | Admitting: Obstetrics and Gynecology

## 2014-04-19 DIAGNOSIS — N644 Mastodynia: Secondary | ICD-10-CM

## 2014-05-12 ENCOUNTER — Ambulatory Visit
Admission: RE | Admit: 2014-05-12 | Discharge: 2014-05-12 | Disposition: A | Discharge status: Home / Self Care | Payer: Managed Care, Other (non HMO) | Source: Ambulatory Visit | Attending: Obstetrics and Gynecology | Admitting: Obstetrics and Gynecology

## 2014-05-12 DIAGNOSIS — N644 Mastodynia: Secondary | ICD-10-CM

## 2014-12-30 ENCOUNTER — Other Ambulatory Visit: Payer: Self-pay | Admitting: Obstetrics and Gynecology

## 2014-12-30 DIAGNOSIS — R928 Other abnormal and inconclusive findings on diagnostic imaging of breast: Secondary | ICD-10-CM

## 2015-01-06 ENCOUNTER — Ambulatory Visit
Admission: RE | Admit: 2015-01-06 | Discharge: 2015-01-06 | Disposition: A | Discharge status: Home / Self Care | Payer: Managed Care, Other (non HMO) | Source: Ambulatory Visit | Attending: Obstetrics and Gynecology | Admitting: Obstetrics and Gynecology

## 2015-01-06 DIAGNOSIS — R928 Other abnormal and inconclusive findings on diagnostic imaging of breast: Secondary | ICD-10-CM

## 2016-01-05 ENCOUNTER — Other Ambulatory Visit: Payer: Self-pay | Admitting: Obstetrics and Gynecology

## 2016-01-08 LAB — CYTOLOGY - PAP

## 2016-01-10 ENCOUNTER — Other Ambulatory Visit: Payer: Self-pay | Admitting: Obstetrics and Gynecology

## 2016-01-10 DIAGNOSIS — R928 Other abnormal and inconclusive findings on diagnostic imaging of breast: Secondary | ICD-10-CM

## 2016-01-17 ENCOUNTER — Ambulatory Visit
Admission: RE | Admit: 2016-01-17 | Discharge: 2016-01-17 | Disposition: A | Discharge status: Home / Self Care | Payer: Managed Care, Other (non HMO) | Source: Ambulatory Visit | Attending: Obstetrics and Gynecology | Admitting: Obstetrics and Gynecology

## 2016-01-17 DIAGNOSIS — R928 Other abnormal and inconclusive findings on diagnostic imaging of breast: Secondary | ICD-10-CM

## 2016-12-11 ENCOUNTER — Other Ambulatory Visit: Payer: Self-pay | Admitting: Obstetrics and Gynecology

## 2016-12-11 DIAGNOSIS — Z1231 Encounter for screening mammogram for malignant neoplasm of breast: Secondary | ICD-10-CM

## 2017-01-06 ENCOUNTER — Ambulatory Visit
Admission: RE | Admit: 2017-01-06 | Discharge: 2017-01-06 | Disposition: A | Discharge status: Home / Self Care | Payer: Managed Care, Other (non HMO) | Source: Ambulatory Visit | Attending: Obstetrics and Gynecology | Admitting: Obstetrics and Gynecology

## 2017-01-06 DIAGNOSIS — Z1231 Encounter for screening mammogram for malignant neoplasm of breast: Secondary | ICD-10-CM

## 2017-11-28 ENCOUNTER — Other Ambulatory Visit: Payer: Self-pay | Admitting: Obstetrics and Gynecology

## 2017-11-28 DIAGNOSIS — Z1231 Encounter for screening mammogram for malignant neoplasm of breast: Secondary | ICD-10-CM

## 2018-01-19 ENCOUNTER — Ambulatory Visit
Admission: RE | Admit: 2018-01-19 | Discharge: 2018-01-19 | Disposition: A | Discharge status: Home / Self Care | Payer: Managed Care, Other (non HMO) | Source: Ambulatory Visit | Attending: Obstetrics and Gynecology | Admitting: Obstetrics and Gynecology

## 2018-01-19 DIAGNOSIS — Z1231 Encounter for screening mammogram for malignant neoplasm of breast: Secondary | ICD-10-CM

## 2018-11-25 ENCOUNTER — Other Ambulatory Visit: Payer: Self-pay | Admitting: Obstetrics and Gynecology

## 2018-11-25 DIAGNOSIS — Z1231 Encounter for screening mammogram for malignant neoplasm of breast: Secondary | ICD-10-CM

## 2019-02-03 ENCOUNTER — Ambulatory Visit
Admission: RE | Admit: 2019-02-03 | Discharge: 2019-02-03 | Disposition: A | Discharge status: Home / Self Care | Payer: Managed Care, Other (non HMO) | Source: Ambulatory Visit | Attending: Obstetrics and Gynecology | Admitting: Obstetrics and Gynecology

## 2019-02-03 ENCOUNTER — Other Ambulatory Visit: Payer: Self-pay

## 2019-02-03 DIAGNOSIS — Z1231 Encounter for screening mammogram for malignant neoplasm of breast: Secondary | ICD-10-CM

## 2019-02-04 ENCOUNTER — Other Ambulatory Visit: Payer: Self-pay | Admitting: Obstetrics and Gynecology

## 2019-02-04 DIAGNOSIS — R928 Other abnormal and inconclusive findings on diagnostic imaging of breast: Secondary | ICD-10-CM

## 2019-02-08 ENCOUNTER — Other Ambulatory Visit: Payer: Self-pay | Admitting: Obstetrics and Gynecology

## 2019-02-08 ENCOUNTER — Ambulatory Visit
Admission: RE | Admit: 2019-02-08 | Discharge: 2019-02-08 | Disposition: A | Discharge status: Home / Self Care | Payer: Managed Care, Other (non HMO) | Source: Ambulatory Visit | Attending: Obstetrics and Gynecology | Admitting: Obstetrics and Gynecology

## 2019-02-08 ENCOUNTER — Other Ambulatory Visit: Payer: Self-pay

## 2019-02-08 DIAGNOSIS — R928 Other abnormal and inconclusive findings on diagnostic imaging of breast: Secondary | ICD-10-CM

## 2019-02-08 DIAGNOSIS — N6489 Other specified disorders of breast: Secondary | ICD-10-CM

## 2019-02-08 DIAGNOSIS — R599 Enlarged lymph nodes, unspecified: Secondary | ICD-10-CM

## 2019-02-11 ENCOUNTER — Ambulatory Visit
Admission: RE | Admit: 2019-02-11 | Discharge: 2019-02-11 | Disposition: A | Discharge status: Home / Self Care | Payer: Managed Care, Other (non HMO) | Source: Ambulatory Visit | Attending: Obstetrics and Gynecology | Admitting: Obstetrics and Gynecology

## 2019-02-11 ENCOUNTER — Other Ambulatory Visit: Payer: Self-pay

## 2019-02-11 ENCOUNTER — Other Ambulatory Visit: Payer: Self-pay | Admitting: Diagnostic Radiology

## 2019-02-11 DIAGNOSIS — N6489 Other specified disorders of breast: Secondary | ICD-10-CM

## 2019-02-11 DIAGNOSIS — R599 Enlarged lymph nodes, unspecified: Secondary | ICD-10-CM

## 2019-03-03 DIAGNOSIS — C773 Secondary and unspecified malignant neoplasm of axilla and upper limb lymph nodes: Secondary | ICD-10-CM | POA: Diagnosis not present

## 2019-03-03 DIAGNOSIS — Z85820 Personal history of malignant melanoma of skin: Secondary | ICD-10-CM

## 2019-03-24 DIAGNOSIS — C439 Malignant melanoma of skin, unspecified: Secondary | ICD-10-CM

## 2019-04-28 DIAGNOSIS — C439 Malignant melanoma of skin, unspecified: Secondary | ICD-10-CM

## 2019-04-29 DIAGNOSIS — C439 Malignant melanoma of skin, unspecified: Secondary | ICD-10-CM

## 2019-05-06 ENCOUNTER — Encounter: Payer: Self-pay | Admitting: Physician Assistant

## 2019-05-06 ENCOUNTER — Emergency Department (HOSPITAL_COMMUNITY): Payer: Managed Care, Other (non HMO)

## 2019-05-06 ENCOUNTER — Inpatient Hospital Stay (HOSPITAL_COMMUNITY)
Admission: EM | Admit: 2019-05-06 | Discharge: 2019-05-14 | DRG: 815 | Disposition: A | Discharge status: Home Health | Payer: Managed Care, Other (non HMO) | Attending: Internal Medicine | Admitting: Internal Medicine

## 2019-05-06 ENCOUNTER — Other Ambulatory Visit (INDEPENDENT_AMBULATORY_CARE_PROVIDER_SITE_OTHER): Payer: Managed Care, Other (non HMO)

## 2019-05-06 ENCOUNTER — Telehealth: Payer: Self-pay

## 2019-05-06 ENCOUNTER — Encounter (HOSPITAL_COMMUNITY): Payer: Self-pay | Admitting: Emergency Medicine

## 2019-05-06 ENCOUNTER — Other Ambulatory Visit: Payer: Self-pay

## 2019-05-06 ENCOUNTER — Ambulatory Visit (INDEPENDENT_AMBULATORY_CARE_PROVIDER_SITE_OTHER): Payer: Managed Care, Other (non HMO) | Admitting: Physician Assistant

## 2019-05-06 VITALS — BP 118/60 | HR 109 | Temp 98.5°F | Ht 63.0 in | Wt 126.0 lb

## 2019-05-06 DIAGNOSIS — K754 Autoimmune hepatitis: Secondary | ICD-10-CM | POA: Diagnosis present

## 2019-05-06 DIAGNOSIS — R5381 Other malaise: Secondary | ICD-10-CM | POA: Diagnosis present

## 2019-05-06 DIAGNOSIS — R000 Tachycardia, unspecified: Secondary | ICD-10-CM | POA: Diagnosis present

## 2019-05-06 DIAGNOSIS — E876 Hypokalemia: Secondary | ICD-10-CM | POA: Diagnosis not present

## 2019-05-06 DIAGNOSIS — E785 Hyperlipidemia, unspecified: Secondary | ICD-10-CM | POA: Diagnosis present

## 2019-05-06 DIAGNOSIS — A419 Sepsis, unspecified organism: Secondary | ICD-10-CM

## 2019-05-06 DIAGNOSIS — Z20822 Contact with and (suspected) exposure to covid-19: Secondary | ICD-10-CM | POA: Diagnosis present

## 2019-05-06 DIAGNOSIS — K5900 Constipation, unspecified: Secondary | ICD-10-CM | POA: Diagnosis not present

## 2019-05-06 DIAGNOSIS — R0602 Shortness of breath: Secondary | ICD-10-CM | POA: Diagnosis not present

## 2019-05-06 DIAGNOSIS — E8809 Other disorders of plasma-protein metabolism, not elsewhere classified: Secondary | ICD-10-CM | POA: Diagnosis present

## 2019-05-06 DIAGNOSIS — M7989 Other specified soft tissue disorders: Secondary | ICD-10-CM | POA: Diagnosis present

## 2019-05-06 DIAGNOSIS — C4361 Malignant melanoma of right upper limb, including shoulder: Secondary | ICD-10-CM | POA: Diagnosis not present

## 2019-05-06 DIAGNOSIS — R630 Anorexia: Secondary | ICD-10-CM | POA: Diagnosis present

## 2019-05-06 DIAGNOSIS — I890 Lymphedema, not elsewhere classified: Secondary | ICD-10-CM | POA: Diagnosis present

## 2019-05-06 DIAGNOSIS — Z79899 Other long term (current) drug therapy: Secondary | ICD-10-CM | POA: Diagnosis not present

## 2019-05-06 DIAGNOSIS — E875 Hyperkalemia: Secondary | ICD-10-CM | POA: Diagnosis not present

## 2019-05-06 DIAGNOSIS — C439 Malignant melanoma of skin, unspecified: Secondary | ICD-10-CM | POA: Diagnosis present

## 2019-05-06 DIAGNOSIS — R1011 Right upper quadrant pain: Secondary | ICD-10-CM

## 2019-05-06 DIAGNOSIS — Z87891 Personal history of nicotine dependence: Secondary | ICD-10-CM

## 2019-05-06 DIAGNOSIS — K719 Toxic liver disease, unspecified: Secondary | ICD-10-CM | POA: Diagnosis not present

## 2019-05-06 DIAGNOSIS — R2231 Localized swelling, mass and lump, right upper limb: Secondary | ICD-10-CM | POA: Diagnosis not present

## 2019-05-06 DIAGNOSIS — I10 Essential (primary) hypertension: Secondary | ICD-10-CM | POA: Diagnosis present

## 2019-05-06 DIAGNOSIS — E872 Acidosis: Secondary | ICD-10-CM | POA: Diagnosis present

## 2019-05-06 DIAGNOSIS — J9811 Atelectasis: Secondary | ICD-10-CM | POA: Diagnosis not present

## 2019-05-06 DIAGNOSIS — C779 Secondary and unspecified malignant neoplasm of lymph node, unspecified: Secondary | ICD-10-CM | POA: Diagnosis present

## 2019-05-06 DIAGNOSIS — D72829 Elevated white blood cell count, unspecified: Secondary | ICD-10-CM | POA: Diagnosis present

## 2019-05-06 DIAGNOSIS — Z803 Family history of malignant neoplasm of breast: Secondary | ICD-10-CM

## 2019-05-06 DIAGNOSIS — R7401 Elevation of levels of liver transaminase levels: Secondary | ICD-10-CM | POA: Diagnosis not present

## 2019-05-06 DIAGNOSIS — D649 Anemia, unspecified: Secondary | ICD-10-CM

## 2019-05-06 DIAGNOSIS — Z95828 Presence of other vascular implants and grafts: Secondary | ICD-10-CM

## 2019-05-06 DIAGNOSIS — R7989 Other specified abnormal findings of blood chemistry: Secondary | ICD-10-CM | POA: Diagnosis not present

## 2019-05-06 DIAGNOSIS — M79621 Pain in right upper arm: Secondary | ICD-10-CM | POA: Diagnosis present

## 2019-05-06 DIAGNOSIS — K838 Other specified diseases of biliary tract: Secondary | ICD-10-CM

## 2019-05-06 DIAGNOSIS — I82A11 Acute embolism and thrombosis of right axillary vein: Secondary | ICD-10-CM | POA: Diagnosis present

## 2019-05-06 DIAGNOSIS — I82621 Acute embolism and thrombosis of deep veins of right upper extremity: Secondary | ICD-10-CM | POA: Diagnosis present

## 2019-05-06 DIAGNOSIS — R945 Abnormal results of liver function studies: Secondary | ICD-10-CM | POA: Diagnosis not present

## 2019-05-06 DIAGNOSIS — R6510 Systemic inflammatory response syndrome (SIRS) of non-infectious origin without acute organ dysfunction: Secondary | ICD-10-CM | POA: Diagnosis not present

## 2019-05-06 DIAGNOSIS — Z888 Allergy status to other drugs, medicaments and biological substances status: Secondary | ICD-10-CM

## 2019-05-06 DIAGNOSIS — Z90710 Acquired absence of both cervix and uterus: Secondary | ICD-10-CM

## 2019-05-06 DIAGNOSIS — G893 Neoplasm related pain (acute) (chronic): Secondary | ICD-10-CM | POA: Diagnosis present

## 2019-05-06 LAB — CBC WITH DIFFERENTIAL/PLATELET
Basophils Absolute: 0.1 10*3/uL (ref 0.0–0.1)
Basophils Relative: 0.3 % (ref 0.0–3.0)
Eosinophils Absolute: 0.1 10*3/uL (ref 0.0–0.7)
Eosinophils Relative: 0.2 % (ref 0.0–5.0)
HCT: 44.3 % (ref 36.0–46.0)
Hemoglobin: 14.7 g/dL (ref 12.0–15.0)
Lymphocytes Relative: 2.8 % — ABNORMAL LOW (ref 12.0–46.0)
Lymphs Abs: 1.1 10*3/uL (ref 0.7–4.0)
MCHC: 33.1 g/dL (ref 30.0–36.0)
MCV: 88.8 fl (ref 78.0–100.0)
Monocytes Absolute: 2.7 10*3/uL — ABNORMAL HIGH (ref 0.1–1.0)
Monocytes Relative: 7 % (ref 3.0–12.0)
Neutro Abs: 35 10*3/uL — ABNORMAL HIGH (ref 1.4–7.7)
Neutrophils Relative %: 89.7 % — ABNORMAL HIGH (ref 43.0–77.0)
Platelets: 508 10*3/uL — ABNORMAL HIGH (ref 150.0–400.0)
RBC: 4.99 Mil/uL (ref 3.87–5.11)
RDW: 13.4 % (ref 11.5–15.5)
WBC: 39 10*3/uL (ref 4.0–10.5)

## 2019-05-06 LAB — TROPONIN I (HIGH SENSITIVITY): Troponin I (High Sensitivity): 18 ng/L — ABNORMAL HIGH (ref <18)

## 2019-05-06 LAB — HEPATIC FUNCTION PANEL
ALT: 741 U/L — ABNORMAL HIGH (ref 0–35)
AST: 486 U/L — ABNORMAL HIGH (ref 0–37)
Albumin: 3.2 g/dL — ABNORMAL LOW (ref 3.5–5.2)
Alkaline Phosphatase: 225 U/L — ABNORMAL HIGH (ref 39–117)
Bilirubin, Direct: 1.5 mg/dL — ABNORMAL HIGH (ref 0.0–0.3)
Total Bilirubin: 2 mg/dL — ABNORMAL HIGH (ref 0.2–1.2)
Total Protein: 7.2 g/dL (ref 6.0–8.3)

## 2019-05-06 LAB — MONONUCLEOSIS SCREEN: Mono Screen: NEGATIVE

## 2019-05-06 LAB — PROTIME-INR
INR: 1.3 ratio — ABNORMAL HIGH (ref 0.8–1.0)
Prothrombin Time: 14.1 s — ABNORMAL HIGH (ref 9.6–13.1)

## 2019-05-06 LAB — TSH: TSH: 3.45 u[IU]/mL (ref 0.350–4.500)

## 2019-05-06 LAB — T4, FREE: Free T4: 1.79 ng/dL — ABNORMAL HIGH (ref 0.61–1.12)

## 2019-05-06 MED ORDER — SODIUM CHLORIDE 0.9 % IV BOLUS
1000.0000 mL | Freq: Once | INTRAVENOUS | Status: AC
  Administered 2019-05-06: 1000 mL via INTRAVENOUS

## 2019-05-06 MED ORDER — PIPERACILLIN-TAZOBACTAM 3.375 G IVPB 30 MIN
3.3750 g | Freq: Once | INTRAVENOUS | Status: AC
  Administered 2019-05-06: 3.375 g via INTRAVENOUS
  Filled 2019-05-06: qty 50

## 2019-05-06 MED ORDER — ONDANSETRON HCL 4 MG/2ML IJ SOLN
4.0000 mg | Freq: Once | INTRAMUSCULAR | Status: AC
  Administered 2019-05-06: 4 mg via INTRAVENOUS
  Filled 2019-05-06: qty 2

## 2019-05-06 MED ORDER — MORPHINE SULFATE (PF) 4 MG/ML IV SOLN
4.0000 mg | Freq: Once | INTRAVENOUS | Status: AC
  Administered 2019-05-06: 4 mg via INTRAVENOUS
  Filled 2019-05-06: qty 1

## 2019-05-06 MED ORDER — VANCOMYCIN HCL 1250 MG/250ML IV SOLN
1250.0000 mg | Freq: Once | INTRAVENOUS | Status: AC
  Administered 2019-05-06: 1250 mg via INTRAVENOUS
  Filled 2019-05-06: qty 250

## 2019-05-06 NOTE — ED Notes (Signed)
MD Tyrone Nine at bedside to attempt [place] IV and get cultures

## 2019-05-06 NOTE — Telephone Encounter (Signed)
Received call from our lab with a critical lab value on this patient. WBC = 39. Called and left message on Ellouise Newer PA phone and sent Epic message to her.

## 2019-05-06 NOTE — ED Notes (Signed)
Transported to MRI via wheelchair.

## 2019-05-06 NOTE — ED Notes (Signed)
Pt transported to [place] via stretcher. Tori RN to access port when pt returns

## 2019-05-06 NOTE — ED Provider Notes (Signed)
[facility] EMERGENCY DEPARTMENT Provider Note   CSN: [id number] Arrival date & time: 05/06/19  1817     History Chief Complaint  Patient presents with  . Abnormal Lab    Angelica Dunn is a 49 y.o. female.  49 yo F with a chief complaints of fatigue.  Has been going on for the past week or so.  Patient states that she has been having some soreness under the right arm after she had a biopsy done about 5 weeks ago.  Started to feel little bit worse over the past week.  Was unfortunately diagnosed with melanoma last year.  She is found some reactive lymph nodes and had a lymph node dissection.  Has had some imaging done recently that showed some concern for recurrence.  Has had multiple biopsies.  She has had a port placed and plans to start immunotherapy soon.  She had seen the gastroenterologist today in the office and they had sent off lab work.  Unfortunately her white count was significantly elevated at 40,000.  She was called back and suggested come to the ED.  The history is provided by the patient.  Abnormal Lab Illness Severity:  Moderate Onset quality:  Gradual Duration:  5 weeks Timing:  Constant Progression:  Worsening Chronicity:  New Associated symptoms: no chest pain, no congestion, no fever, no headaches, no myalgias, no nausea, no rhinorrhea, no shortness of breath, no vomiting and no wheezing        Past Medical History:  Diagnosis Date  . No pertinent past medical history     There are no problems to display for this patient.   Past Surgical History:  Procedure Laterality Date  . BLADDER SUSPENSION  10/09/2011   Procedure: TRANSVAGINAL TAPE (TVT) PROCEDURE;  Surgeon: Daria Pastures, MD;  Location: [facility];  Service: Gynecology;  Laterality: N/A;  . BREAST EXCISIONAL BIOPSY Right   . CYSTOCELE REPAIR  10/09/2011   Procedure: ANTERIOR REPAIR (CYSTOCELE);  Surgeon: Daria Pastures, MD;  Location: [facility];  Service:  Gynecology;  Laterality: N/A;  . CYSTOSCOPY  10/09/2011   Procedure: CYSTOSCOPY;  Surgeon: Daria Pastures, MD;  Location: [facility];  Service: Gynecology;  Laterality: N/A;  . NO PAST SURGERIES    . VAGINAL HYSTERECTOMY  10/09/2011   Procedure: HYSTERECTOMY VAGINAL;  Surgeon: Daria Pastures, MD;  Location: [facility];  Service: Gynecology;  Laterality: N/A;     OB History   No obstetric history on file.     Family History  Problem Relation Age of Onset  . Breast cancer Maternal Aunt   . Breast cancer Cousin     Social History   Tobacco Use  . Smoking status: Former Research scientist (life sciences)  . Smokeless tobacco: Never Used  Substance Use Topics  . Alcohol use: Not Currently  . Drug use: No    Home Medications Prior to Admission medications   Medication Sig Start Date End Date Taking? Authorizing Provider  acetaminophen (TYLENOL) 325 MG tablet Take 325-650 mg by mouth every 6 (six) hours as needed for mild pain or headache.   Yes [provider]  amitriptyline (ELAVIL) 10 MG tablet Take 10-20 mg by mouth at bedtime as needed for sleep.    Yes [provider]  fexofenadine (ALLEGRA) 180 MG tablet Take 180 mg by mouth daily as needed for rhinitis (or seasonal allergies).   Yes [provider]  lisinopril (ZESTRIL) 20 MG tablet Take 20 mg by mouth at bedtime.  Yes [provider]  magnesium hydroxide (MILK OF MAGNESIA) 400 MG/5ML suspension Take 15-30 mLs by mouth 2 (two) times daily as needed for mild constipation or moderate constipation.    Yes [provider]  ondansetron (ZOFRAN-ODT) 4 MG disintegrating tablet Take 4 mg by mouth every 8 (eight) hours as needed for nausea or vomiting (DISSOLVE ORALLY).   Yes [provider]  oxyCODONE (OXY IR/ROXICODONE) 5 MG immediate release tablet Take 5 mg by mouth 3 (three) times daily as needed for moderate pain or severe pain.   Yes [provider]  traMADol (ULTRAM) 50 MG tablet Take 50 mg by  mouth every 6 (six) hours as needed (for pain).    Yes [provider]  triamcinolone (NASACORT ALLERGY 24HR) 55 MCG/ACT AERO nasal inhaler Place 2 sprays into the nose daily as needed (for seasonal allergies).   Yes [provider]  ezetimibe (ZETIA) 10 MG tablet Take 10 mg by mouth daily.    [provider]    Allergies    Gabapentin  Review of Systems   Review of Systems  Constitutional: Negative for chills and fever.  HENT: Negative for congestion and rhinorrhea.   Eyes: Negative for redness and visual disturbance.  Respiratory: Negative for shortness of breath and wheezing.   Cardiovascular: Negative for chest pain and palpitations.  Gastrointestinal: Negative for nausea and vomiting.  Genitourinary: Negative for dysuria and urgency.  Musculoskeletal: Negative for arthralgias and myalgias.  Skin: Negative for pallor and wound.  Neurological: Negative for dizziness and headaches.    Physical Exam Updated Vital Signs BP 105/68   Pulse (!) 104   Temp 98 F (36.7 C) (Oral)   Resp 16   LMP 09/29/2011   SpO2 99%   Physical Exam Vitals and nursing note reviewed.  Constitutional:      General: She is not in acute distress.    Appearance: She is well-developed. She is not diaphoretic.  HENT:     Head: Normocephalic and atraumatic.  Eyes:     Pupils: Pupils are equal, round, and reactive to light.  Cardiovascular:     Rate and Rhythm: Regular rhythm. Tachycardia present.     Heart sounds: No murmur. No friction rub. No gallop.   Pulmonary:     Effort: Pulmonary effort is normal.     Breath sounds: No wheezing or rales.  Abdominal:     General: There is no distension.     Palpations: Abdomen is soft.     Tenderness: There is no abdominal tenderness.  Musculoskeletal:        General: No tenderness.     Cervical back: Normal range of motion and neck supple.  Skin:    General: Skin is warm and dry.  Neurological:     Mental Status: She is  alert and oriented to person, place, and time.  Psychiatric:        Behavior: Behavior normal.     ED Results / Procedures / Treatments   Labs (all labs ordered are listed, but only abnormal results are displayed) Labs Reviewed  T4, FREE - Abnormal; Notable for the following components:      Result Value   Free T4 1.79 (*)    All other components within normal limits  TROPONIN I (HIGH SENSITIVITY) - Abnormal; Notable for the following components:   Troponin I (High Sensitivity) 18 (*)    All other components within normal limits  CULTURE, BLOOD (ROUTINE X 2)  CULTURE, BLOOD (ROUTINE  X 2)  RESPIRATORY PANEL BY RT PCR (FLU A&B, COVID)  TSH  LIPASE, BLOOD    EKG None  Radiology US Abdomen Limited RUQ  Result Date: 05/06/2019 CLINICAL DATA:  Right upper quadrant abdominal pain. EXAM: ULTRASOUND ABDOMEN LIMITED RIGHT UPPER QUADRANT COMPARISON:  CT 04/28/2019 at [facility]: Gallbladder: Distended. No gallstones or wall thickening visualized. No sonographic Murphy sign noted by sonographer. Common bile duct: Diameter: Dilated measuring 10 mm at the porta hepatis, 8 mm distally. Liver: No focal lesion identified. Within normal limits in parenchymal echogenicity. Portal vein is patent on color Doppler imaging with normal direction of blood flow towards the liver. Other: Right pleural effusion, not seen on recent CT. IMPRESSION: 1. Biliary dilatation with common bile duct measuring 10 mm. This is new from CT 1 week ago. Recommend further evaluation with MRCP or ERCP. 2. Gallbladder is mildly distended, but otherwise normal. No gallstones. 3. Right pleural effusion incidentally noted, new from prior CT. Electronically Signed   By: Keith Rake M.D.   On: 05/06/2019 20:50    Procedures Procedures (including critical care time) Procedure note: Ultrasound Guided Peripheral IV Ultrasound guided peripheral 1.88 inch angiocath IV placement performed by me. Indications: Nursing  unable to place IV. Details: The antecubital fossa and upper arm were evaluated with a multifrequency linear probe. Patent brachial veins were noted. 1 attempt was made to cannulate a vein under realtime US guidance with successful cannulation of the vein and catheter placement. There is return of non-pulsatile dark red blood. The patient tolerated the procedure well without complications. Images archived electronically.  CPT codes: [id number] and [phone]  Medications Ordered in ED Medications  vancomycin (VANCOREADY) IVPB 1250 mg/250 mL (1,250 mg Intravenous New Bag/Given 05/06/19 2234)  sodium chloride 0.9 % bolus 1,000 mL (1,000 mLs Intravenous New Bag/Given 05/06/19 2105)  morphine 4 MG/ML injection 4 mg (4 mg Intravenous Given 05/06/19 2105)  ondansetron (ZOFRAN) injection 4 mg (4 mg Intravenous Given 05/06/19 2105)  piperacillin-tazobactam (ZOSYN) IVPB 3.375 g (0 g Intravenous Stopped 05/06/19 2232)    ED Course  I have reviewed the triage vital signs and the nursing notes.  Pertinent labs & imaging results that were available during my care of the patient were reviewed by me and considered in my medical decision making (see chart for details).    MDM Rules/Calculators/A&P                      49 yo F with a cc of fatigue.  She went to her GI doctor's office today and had an evaluation.  Lab work returned with a significant leukocytosis.  She was then encouraged to come to the ED.  Further notes they are requesting an ultrasound of the right upper quadrant.  She has no significant abdominal tenderness for me.  She is complaining mostly of pain to the area of biopsy site was under her right arm.  Will obtain CT chest.  Fluids, pain meds, reassess.   Ultrasound of the gallbladder shows significant dilatation.  On prior CT imaging from less than a week ago.  I discussed this with the gastroenterologist on-call, Dr. Lyndel Safe.  He recommended an MRCP and broad-spectrum antibiotics.  Will discuss with the  hospitalist for admission.  The patients results and plan were reviewed and discussed.   Any x-rays performed were independently reviewed by myself.   Differential diagnosis were considered with the presenting HPI.  Medications  vancomycin (VANCOREADY) IVPB 1250 mg/250 mL (1,250  mg Intravenous New Bag/Given 05/06/19 2234)  sodium chloride 0.9 % bolus 1,000 mL (1,000 mLs Intravenous New Bag/Given 05/06/19 2105)  morphine 4 MG/ML injection 4 mg (4 mg Intravenous Given 05/06/19 2105)  ondansetron (ZOFRAN) injection 4 mg (4 mg Intravenous Given 05/06/19 2105)  piperacillin-tazobactam (ZOSYN) IVPB 3.375 g (0 g Intravenous Stopped 05/06/19 2232)    Vitals:   05/06/19 2200 05/06/19 2215 05/06/19 2230 05/06/19 2245  BP: 108/66 107/66 108/69 105/68  Pulse: (!) 110 (!) 108 (!) 108 (!) 104  Resp: 16 17 16 16   Temp:      TempSrc:      SpO2: 99% 99% 99% 99%    Final diagnoses:  Abdominal pain, RUQ  Common bile duct dilatation    Admission/ observation were discussed with the admitting physician, patient and/or family and they are comfortable with the plan.     Final Clinical Impression(s) / ED Diagnoses Final diagnoses:  Abdominal pain, RUQ  Common bile duct dilatation    Rx / DC Orders ED Discharge Orders    None       Deno Etienne, DO 05/06/19 2300

## 2019-05-06 NOTE — Progress Notes (Signed)
Agree with assessment and plan as outlined.  50 year old female with a history of melanoma status post surgical excision (only topical anesthetic used per report), who is referred after having a significant increase in her ALT and AST as outlined.  No obvious new medications to be related to this.  CT scan done within hours of this lab draw did not show any concerning pathology of the liver or clot.  It is unfortunately been about a week since her last blood draw we will need to check LFTs today to see where they are trending as well as an INR.  Serologic work-up including test for acute viral hepatitis, which includes EBV, HSV, hep C RNA, will also work-up for autoimmune and ensure ceruloplasmin negative.  Sounds like she feels pretty well, but if enzymes are rising for some reason she will need more urgent evaluation.  Hopefully they have down trended and will await the results of her blood work.  If nothing significant noted on labs and she has a persistent elevation in her liver enzymes she may need a liver biopsy.  She should avoid all NSAIDs and any hepatotoxic medications otherwise.

## 2019-05-06 NOTE — Patient Instructions (Addendum)
If you are age 49 or older, your body mass index should be between 23-30. Your Body mass index is 22.32 kg/m. If this is out of the aforementioned range listed, please consider follow up with your Primary Care Provider.  If you are age 75 or younger, your body mass index should be between 19-25. Your Body mass index is 22.32 kg/m. If this is out of the aformentioned range listed, please consider follow up with your Primary Care Provider.   Your provider has requested that you go to the basement level for lab work before leaving today. Press "B" on the elevator. The lab is located at the first door on the left as you exit the elevator.   Hold NSAIDs

## 2019-05-06 NOTE — Telephone Encounter (Signed)
Called patient and let her know Dr. Havery Moros would like her to stop her Zetia, until further notice since it can cause elevated liver enzymes

## 2019-05-06 NOTE — ED Notes (Signed)
Tori RN at bedside to access port

## 2019-05-06 NOTE — ED Triage Notes (Signed)
Pt sent by her doctor for evaluation of WBC 39 from lab work done this am. Pt denies any complaints at this time.

## 2019-05-06 NOTE — Progress Notes (Signed)
Chief Complaint: Elevated LFTs  HPI:    Angelica Dunn is a 49 year old female with a past medical history as listed below including melanoma, who was referred to me by Bobbye Charleston, MD for a complaint of elevated LFTs.      04/28/2019 CT chest abdomen and pelvis with contrast showed no evidence of liver metastasis, no metastatic disease in the abdomen pelvis, normal liver.    04/29/2019 labs with a normal PT/INR.  Creatinine elevated at 1.2.  Sodium low at 135.  AST elevated at 1298(03/03/2019-28), ALT elevated at 1894 (03/03/19-18), alk phos elevated 203 (03/03/2019-59), total bilirubin 1.2 normal, lactate dehydrogenase high at 1521 (03/03/2019-358).  Per cancer center physicians notes liver enzymes had risen over the past 24 hours and labs show no evidence of hepatitis ABC.  Apparently her medication list has been reviewed for any cause of possible hepatotoxicity and there was none.  AndASMA was checked that day (we do not have results).    Today, the patient presents to clinic accompanied by her mother and tells me that on February 3 she had a surgery to remove her melanoma.  She was doing well for the 2 weeks after but then started feeling somewhat nauseous with a decrease in appetite and increased fatigue.  Her arm also started hurting.  Tells me that she went back on March 3 to see her cancer doctors to start immune therapy and they ran labs which showed extremely elevated LFTs so they did not start anything.  She had a CT around that time as well which showed a normal liver.  Continues with fatigue decrease in appetite and nausea.  Denies abdominal pain.    Denies fever, chills, weight loss, change in bowel habits, jaundice, history of liver disease, use of IV drugs, alcohol abuse, new relationships or previous hepatitis or family history of liver disease.     Past Medical/ Past Surgical History:  Procedure Laterality Date   BLADDER SUSPENSION  10/09/2011   Procedure: TRANSVAGINAL TAPE (TVT)  PROCEDURE;  Surgeon: Daria Pastures, MD;  Location: [facility];  Service: Gynecology;  Laterality: N/A;   BREAST EXCISIONAL BIOPSY Right    CYSTOCELE REPAIR  10/09/2011   Procedure: ANTERIOR REPAIR (CYSTOCELE);  Surgeon: Daria Pastures, MD;  Location: [facility];  Service: Gynecology;  Laterality: N/A;   CYSTOSCOPY  10/09/2011   Procedure: CYSTOSCOPY;  Surgeon: Daria Pastures, MD;  Location: [facility];  Service: Gynecology;  Laterality: N/A;   NO PAST SURGERIES     VAGINAL HYSTERECTOMY  10/09/2011   Procedure: HYSTERECTOMY VAGINAL;  Surgeon: Daria Pastures, MD;  Location: [facility];  Service: Gynecology;  Laterality: N/A;    Current Outpatient Medications  Medication Sig Dispense Refill   amitriptyline (ELAVIL) 10 MG tablet Take 10 mg by mouth at bedtime as needed for sleep.     docusate sodium (COLACE) 100 MG capsule Take 100 mg by mouth daily.     ezetimibe (ZETIA) 10 MG tablet Take 10 mg by mouth daily.     lisinopril (ZESTRIL) 20 MG tablet Take 20 mg by mouth daily.     Naproxen Sodium (ALEVE) 220 MG CAPS Take 220 mg by mouth 3 (three) times daily as needed.     traMADol (ULTRAM) 50 MG tablet Take by mouth every 6 (six) hours as needed.     No current facility-administered medications for this visit.    Allergies as of 05/06/2019   (No Known Allergies)    Family History  Problem Relation Age of Onset   Breast cancer Maternal Aunt    Breast cancer Cousin     Social History   Socioeconomic History   Marital status: Divorced    Spouse name: Not on file   Number of children: Not on file   Years of education: Not on file   Highest education level: Not on file  Occupational History   Not on file  Tobacco Use   Smoking status: Former Smoker   Smokeless tobacco: Never Used  Substance and Sexual Activity   Alcohol use: Not Currently   Drug use: No   Sexual activity: Not on file  Other Topics Concern   Not on file  Social History Narrative   Not  on file   Social Determinants of Health   Financial Resource Strain:    Difficulty of Paying Living Expenses:   Food Insecurity:    Worried About Charity fundraiser in the Last Year:    Arboriculturist in the Last Year:   Transportation Needs:    Film/video editor (Medical):    Lack of Transportation (Non-Medical):   Physical Activity:    Days of Exercise per Week:    Minutes of Exercise per Session:   Stress:    Feeling of Stress :   Social Connections:    Frequency of Communication with Friends and Family:    Frequency of Social Gatherings with Friends and Family:    Attends Religious Services:    Active Member of Clubs or Organizations:    Attends Music therapist:    Marital Status:   Intimate Partner Violence:    Fear of Current or Ex-Partner:    Emotionally Abused:    Physically Abused:    Sexually Abused:     Review of Systems:    Constitutional: No weight loss, fever or chills Skin: No rash  Cardiovascular: No chest pain Respiratory: No SOB  Gastrointestinal: See HPI and otherwise negative Genitourinary: No dysuria  Neurological: No headache, dizziness or syncope Musculoskeletal: No new muscle or joint pain Hematologic: No bleeding  Psychiatric: No history of depression or anxiety   Physical Exam:  Vital signs: BP 118/60 (BP Location: Left Arm, Patient Position: Sitting, Cuff Size: Normal)    Pulse (!) 109    Temp 98.5 F (36.9 C) (Oral)    Ht 5' 3" (1.6 m)    Wt 126 lb (57.2 kg)    LMP 09/29/2011    BMI 22.32 kg/m   Constitutional:   Pleasant Caucasian female appears to be in NAD, Well developed, Well nourished, alert and cooperative Head:  Normocephalic and atraumatic. Eyes:   PEERL, EOMI. No icterus. Conjunctiva pink. Ears:  Normal auditory acuity. Neck:  Supple Throat: Oral cavity and pharynx without inflammation, swelling or lesion.  Respiratory: Respirations even and unlabored. Lungs clear to auscultation  bilaterally.   No wheezes, crackles, or rhonchi.  Cardiovascular: Normal S1, S2. No MRG. Regular rate and rhythm. No peripheral edema, cyanosis or pallor.  Gastrointestinal:  Soft, nondistended, nontender. No rebound or guarding. Normal bowel sounds. No appreciable masses or hepatomegaly. Rectal:  Not performed.  Msk:  Symmetrical without gross deformities. Without edema, no deformity or joint abnormality.  Neurologic:  Alert and  oriented x4;  grossly normal neurologically.  Skin:   Dry and intact without significant lesions or rashes. Psychiatric:  Demonstrates good judgement and reason without abnormal affect or behaviors.  See HPI for recent labs and imaging.  Assessment: 1.  Elevated LFTs: As in HPI, extreme elevation of transaminases over 2-week time period,  CT abdomen with normal liver, patient with fatigue, decrease in appetite and nausea, hepatitis testing negative; consider viral versus infectious versus autoimmune cause 2.  Melanoma: right are, recently diagnosed and underwent excision/biopsy with local anesthesia on 03/31/2019  Plan: 1.  Discussed with patient that we will run further labs today to decipher if she has a viral, infectious or autoimmune cause of elevated liver enzymes. 2.  Ordered repeat hepatic panel, iron studies, PT, INR, hep C RNA, EBV testing, HSV, alpha-1 antitrypsin, ANA, ASMA, AMA, total IgG, ceruloplasmin and CBC 3.  If results return normal but liver enzymes continue to be elevated would recommend an ultrasound of the liver with Doppler.  If this returns normal would recommend a liver biopsy. 4.  Recommend the patient avoid NSAIDs. 5.  Patient to follow in clinic per recommendations after labs above.  She was assigned to Dr. Havery Moros today.  Case was discussed with him at time of appointment.  Ellouise Newer, PA-C [facility] 05/06/2019, 10:45 AM  Cc: Bobbye Charleston, MD

## 2019-05-06 NOTE — Progress Notes (Signed)
Anderson Malta can you please also have her stop the Zetia until this is sorted out. Thanks

## 2019-05-06 NOTE — ED Notes (Signed)
Per Dr. Tyrone Nine, no triage orders at this time.

## 2019-05-06 NOTE — H&P (Signed)
4  History and Physical  Angelica Dunn [id number] DOB: 19-Oct-1970 DOA: 05/06/2019  Referring physician: ER provider PCP: Renaldo Reel, PA  Outpatient Specialists: GI team Patient coming from: Home  Chief Complaint: Elevated WBC  HPI:  Patient is a 49 year old lady with history of recurrent melanoma with lymph node involvement.  Patient underwent right axillary lymph node dissection on March 31, 2019, as well as, placement of port for possible immunotherapy.  Postop.  Has been complicated by significantly elevated liver enzymes.  Patient was referred to the GI team by the PCP, patient was seen earlier today.  Liver enzymes remain significantly elevated.  Additionally, patient has significant leukocytosis, with WBC of 39,000.  Patient denies fever, chills, headache, neck pain, URI symptoms, chest pain, shortness of breath, GI symptoms or urinary symptoms.  Abdominal ultrasound revealed biliary dilatation with common bile duct measuring 10 mm.  Gallbladder was said to be mildly distended, but otherwise normal.  No gallstones.  Right-sided pleural effusion was incidentally noted, which is said to be new.  Patient will be admitted for further assessment and management.  ED Course: On presentation to the hospital, vitals revealed temperature of 98.5, heart rate of 140 140 bpm, respiratory rate of 15, blood pressure of 110/74 mmHg and O2 sat of 99%.  Patient has been pancultured by the ER providers.  Patient will be started IV vancomycin and Zosyn.  Pertinent labs: CBC reveals WBC of 39, hemoglobin of 14.7, hematocrit of 44.3 with platelet count of 500.  Test revealed ALT of 741, AST of 486, alkaline phosphatase of 225, albumin of 3.2, direct bilirubin of 1.5 with total bilirubin of 2 and total protein of 7.2.  Imaging: independently reviewed.   Review of Systems:  Negative for fever, visual changes, sore throat, rash, new muscle aches, chest pain, SOB, dysuria, bleeding,  n/v/abdominal pain.  Patient has right axillary discomfort/pain following lymph node dissection on March 30, 2019.  Past Medical History:  Diagnosis Date  . No pertinent past medical history     Past Surgical History:  Procedure Laterality Date  . BLADDER SUSPENSION  10/09/2011   Procedure: TRANSVAGINAL TAPE (TVT) PROCEDURE;  Surgeon: Daria Pastures, MD;  Location: [facility];  Service: Gynecology;  Laterality: N/A;  . BREAST EXCISIONAL BIOPSY Right   . CYSTOCELE REPAIR  10/09/2011   Procedure: ANTERIOR REPAIR (CYSTOCELE);  Surgeon: Daria Pastures, MD;  Location: [facility];  Service: Gynecology;  Laterality: N/A;  . CYSTOSCOPY  10/09/2011   Procedure: CYSTOSCOPY;  Surgeon: Daria Pastures, MD;  Location: [facility];  Service: Gynecology;  Laterality: N/A;  . NO PAST SURGERIES    . VAGINAL HYSTERECTOMY  10/09/2011   Procedure: HYSTERECTOMY VAGINAL;  Surgeon: Daria Pastures, MD;  Location: [facility];  Service: Gynecology;  Laterality: N/A;     reports that she has quit smoking. She has never used smokeless tobacco. She reports previous alcohol use. She reports that she does not use drugs.  Allergies  Allergen Reactions  . Gabapentin Other (See Comments)    "Made me feel odd, so I stopped taking it"    Family History  Problem Relation Age of Onset  . Breast cancer Maternal Aunt   . Breast cancer Cousin      Prior to Admission medications   Medication Sig Start Date End Date Taking? Authorizing Provider  acetaminophen (TYLENOL) 325 MG tablet Take 325-650 mg by mouth every 6 (six) hours as needed for mild pain or headache.   Yes  [provider]  amitriptyline (ELAVIL) 10 MG tablet Take 10-20 mg by mouth at bedtime as needed for sleep.    Yes [provider]  fexofenadine (ALLEGRA) 180 MG tablet Take 180 mg by mouth daily as needed for rhinitis (or seasonal allergies).   Yes [provider]  lisinopril (ZESTRIL) 20 MG tablet Take 20 mg by mouth at bedtime.     Yes [provider]  magnesium hydroxide (MILK OF MAGNESIA) 400 MG/5ML suspension Take 15-30 mLs by mouth 2 (two) times daily as needed for mild constipation or moderate constipation.    Yes [provider]  ondansetron (ZOFRAN-ODT) 4 MG disintegrating tablet Take 4 mg by mouth every 8 (eight) hours as needed for nausea or vomiting (DISSOLVE ORALLY).   Yes [provider]  oxyCODONE (OXY IR/ROXICODONE) 5 MG immediate release tablet Take 5 mg by mouth 3 (three) times daily as needed for moderate pain or severe pain.   Yes [provider]  traMADol (ULTRAM) 50 MG tablet Take 50 mg by mouth every 6 (six) hours as needed (for pain).    Yes [provider]  triamcinolone (NASACORT ALLERGY 24HR) 55 MCG/ACT AERO nasal inhaler Place 2 sprays into the nose daily as needed (for seasonal allergies).   Yes [provider]  ezetimibe (ZETIA) 10 MG tablet Take 10 mg by mouth daily.    [provider]    Physical Exam: Vitals:   05/06/19 2230 05/06/19 2245 05/06/19 2300 05/06/19 2315  BP: 108/69 105/68 117/76 110/74  Pulse: (!) 108 (!) 104 (!) 107 (!) 105  Resp: 16 16 16 15   Temp:      TempSrc:      SpO2: 99% 99% 99% 99%    Constitutional:  . Appears calm and comfortable Eyes:  . No pallor. No jaundice.  ENMT:  . external ears, nose appear normal Neck:  . Neck is supple. No JVD Respiratory:  . CTA bilaterally, no w/r/r.  . Respiratory effort normal. No retractions or accessory muscle use Cardiovascular:  . S1S2 . No LE extremity edema   Abdomen:  . Abdomen is soft and non tender. Organs are difficult to assess. Neurologic:  . Awake and alert. . Moves all limbs.  Wt Readings from Last 3 Encounters:  05/06/19 57.2 kg  10/09/11 55.8 kg  10/03/11 55.8 kg    I have personally reviewed following labs and imaging studies  Labs on Admission:  CBC: Recent Labs  Lab 05/06/19 1056  WBC 39.0 Repeated and verified X2.*    NEUTROABS 35.0*  HGB 14.7  HCT 44.3  MCV 88.8  PLT 508.0 Repeated and verified X2.*   Basic Metabolic Panel: No results for input(s): NA, K, CL, CO2, GLUCOSE, BUN, CREATININE, CALCIUM, MG, PHOS in the last 168 hours. Liver Function Tests: Recent Labs  Lab 05/06/19 1056  AST 486*  ALT 741*  ALKPHOS 225*  BILITOT 2.0*  PROT 7.2  ALBUMIN 3.2*   No results for input(s): LIPASE, AMYLASE in the last 168 hours. No results for input(s): AMMONIA in the last 168 hours. Coagulation Profile: Recent Labs  Lab 05/06/19 1056  INR 1.3*   Cardiac Enzymes: No results for input(s): CKTOTAL, CKMB, CKMBINDEX, TROPONINI in the last 168 hours. BNP (last 3 results) No results for input(s): PROBNP in the last 8760 hours. HbA1C: No results for input(s): HGBA1C in the last 72 hours. CBG: No results for input(s): GLUCAP in the last 168 hours. Lipid Profile: No results for input(s): CHOL, HDL,  LDLCALC, TRIG, CHOLHDL, LDLDIRECT in the last 72 hours. Thyroid Function Tests: Recent Labs    05/06/19 1929  TSH 3.450  FREET4 1.79*   Anemia Panel: No results for input(s): VITAMINB12, FOLATE, FERRITIN, TIBC, IRON, RETICCTPCT in the last 72 hours. Urine analysis: No results found for: COLORURINE, APPEARANCEUR, LABSPEC, PHURINE, GLUCOSEU, HGBUR, BILIRUBINUR, KETONESUR, PROTEINUR, UROBILINOGEN, NITRITE, LEUKOCYTESUR Sepsis Labs: @LABRCNTIP (procalcitonin:4,lacticidven:4) )No results found for this or any previous visit (from the past 240 hour(s)).    Radiological Exams on Admission: US Abdomen Limited RUQ  Result Date: 05/06/2019 CLINICAL DATA:  Right upper quadrant abdominal pain. EXAM: ULTRASOUND ABDOMEN LIMITED RIGHT UPPER QUADRANT COMPARISON:  CT 04/28/2019 at [place]: Gallbladder: Distended. No gallstones or wall thickening visualized. No sonographic Murphy sign noted by sonographer. Common bile duct: Diameter: Dilated measuring 10 mm at the porta hepatis, 8 mm distally.  Liver: No focal lesion identified. Within normal limits in parenchymal echogenicity. Portal vein is patent on color Doppler imaging with normal direction of blood flow towards the liver. Other: Right pleural effusion, not seen on recent CT. IMPRESSION: 1. Biliary dilatation with common bile duct measuring 10 mm. This is new from CT 1 week ago. Recommend further evaluation with MRCP or ERCP. 2. Gallbladder is mildly distended, but otherwise normal. No gallstones. 3. Right pleural effusion incidentally noted, new from prior CT. Electronically Signed   By: Keith Rake M.D.   On: 05/06/2019 20:50    Active Problems:   Leukocytosis   Assessment/Plan: Leukocytosis//possible sepsis syndrome: -Admit patient -Panculture patient -MRI of the abdomen -Broad-spectrum antibiotics -Follow cultures -Repeat CBC -Obtain documentation from patient's oncologist, Dr. Bobby Rumpf, based in [place], [facility]). -Further management depend on hospital course.  Recurrent melanoma with lymph node involvement: -Patient is status post lymph node dissection. -Immunotherapy is currently on hold due to abnormal liver function tests -GI team is managing abnormal LFTs. -Patient is known to an oncologist based [place], [place].  Abnormal LFTs: -MRI of the abdomen -Continue to monitor LFTs -Further management will depend on above.  DVT prophylaxis: SCD Code Status: Full code Family Communication:  Disposition Plan: This will depend on hospital course Consults called: None Admission status: Inpatient  Time spent: 65 minutes  Dana Allan, MD  [facility] Pager #: [phone] 7PM-7AM contact night coverage as above  05/06/2019, 11:44 PM

## 2019-05-06 NOTE — Progress Notes (Signed)
Pharmacy Antibiotic Note  Angelica Dunn is a 49 y.o. female admitted on 05/06/2019 with fatigue and leukocytosis, possible cholecystitis.  Pharmacy has been consulted for Vancomycin and Zosyn  Dosing.  Vancomycin 1250 mg IV given in ED at 2230    Plan: Vancomycin 750 mg IV q24h Est AUC 494 Zosyn 3.375 g IV q8h  Height: 5\' 3"  (160 cm) Weight: 125 lb 14.1 oz (57.1 kg) IBW/kg (Calculated) : 52.4  Temp (24hrs), Avg:98 F (36.7 C), Min:97.7 F (36.5 C), Max:98.5 F (36.9 C)  Recent Labs  Lab 05/06/19 1056 05/07/19 0148  WBC 39.0 Repeated and verified X2.* 34.9*  CREATININE  --  1.45*    Estimated Creatinine Clearance: 39.2 mL/min (A) (by C-G formula based on SCr of 1.45 mg/dL (H)).    Allergies  Allergen Reactions  . Gabapentin Other (See Comments)    "Made me feel odd, so I stopped taking it"     Caryl Pina 05/07/2019 4:08 AM

## 2019-05-07 ENCOUNTER — Encounter (HOSPITAL_COMMUNITY): Payer: Self-pay | Admitting: Internal Medicine

## 2019-05-07 ENCOUNTER — Encounter (HOSPITAL_COMMUNITY)
Admission: EM | Disposition: A | Discharge status: Home Health | Payer: Self-pay | Source: Home / Self Care | Attending: Internal Medicine

## 2019-05-07 ENCOUNTER — Inpatient Hospital Stay (HOSPITAL_COMMUNITY): Payer: Managed Care, Other (non HMO)

## 2019-05-07 DIAGNOSIS — C439 Malignant melanoma of skin, unspecified: Secondary | ICD-10-CM

## 2019-05-07 DIAGNOSIS — C4361 Malignant melanoma of right upper limb, including shoulder: Secondary | ICD-10-CM

## 2019-05-07 LAB — PROCALCITONIN: Procalcitonin: 1.5 ng/mL

## 2019-05-07 LAB — RENAL FUNCTION PANEL
Albumin: 2.4 g/dL — ABNORMAL LOW (ref 3.5–5.0)
Anion gap: 13 (ref 5–15)
BUN: 24 mg/dL — ABNORMAL HIGH (ref 6–20)
CO2: 20 mmol/L — ABNORMAL LOW (ref 22–32)
Calcium: 12.5 mg/dL — ABNORMAL HIGH (ref 8.9–10.3)
Chloride: 108 mmol/L (ref 98–111)
Creatinine, Ser: 1.45 mg/dL — ABNORMAL HIGH (ref 0.44–1.00)
GFR calc Af Amer: 49 mL/min — ABNORMAL LOW (ref >60)
GFR calc non Af Amer: 42 mL/min — ABNORMAL LOW (ref >60)
Glucose, Bld: 79 mg/dL (ref 70–99)
Phosphorus: 4.8 mg/dL — ABNORMAL HIGH (ref 2.5–4.6)
Potassium: 3.7 mmol/L (ref 3.5–5.1)
Sodium: 141 mmol/L (ref 135–145)

## 2019-05-07 LAB — URINALYSIS, COMPLETE (UACMP) WITH MICROSCOPIC
Bacteria, UA: NONE SEEN
Bilirubin Urine: NEGATIVE
Glucose, UA: NEGATIVE mg/dL
Hgb urine dipstick: NEGATIVE
Ketones, ur: NEGATIVE mg/dL
Leukocytes,Ua: NEGATIVE
Nitrite: NEGATIVE
Protein, ur: NEGATIVE mg/dL
Specific Gravity, Urine: 1.009 (ref 1.005–1.030)
pH: 6 (ref 5.0–8.0)

## 2019-05-07 LAB — CBC WITH DIFFERENTIAL/PLATELET
Abs Immature Granulocytes: 0.31 10*3/uL — ABNORMAL HIGH (ref 0.00–0.07)
Basophils Absolute: 0.1 10*3/uL (ref 0.0–0.1)
Basophils Relative: 0 %
Eosinophils Absolute: 0 10*3/uL (ref 0.0–0.5)
Eosinophils Relative: 0 %
HCT: 41.7 % (ref 36.0–46.0)
Hemoglobin: 13.2 g/dL (ref 12.0–15.0)
Immature Granulocytes: 1 %
Lymphocytes Relative: 5 %
Lymphs Abs: 1.7 10*3/uL (ref 0.7–4.0)
MCH: 29.3 pg (ref 26.0–34.0)
MCHC: 31.7 g/dL (ref 30.0–36.0)
MCV: 92.5 fL (ref 80.0–100.0)
Monocytes Absolute: 3 10*3/uL — ABNORMAL HIGH (ref 0.1–1.0)
Monocytes Relative: 9 %
Neutro Abs: 29.8 10*3/uL — ABNORMAL HIGH (ref 1.7–7.7)
Neutrophils Relative %: 85 %
Platelets: 460 10*3/uL — ABNORMAL HIGH (ref 150–400)
RBC: 4.51 MIL/uL (ref 3.87–5.11)
RDW: 13.2 % (ref 11.5–15.5)
WBC: 34.9 10*3/uL — ABNORMAL HIGH (ref 4.0–10.5)
nRBC: 0 % (ref 0.0–0.2)

## 2019-05-07 LAB — CBC
HCT: 41.6 % (ref 36.0–46.0)
Hemoglobin: 13.5 g/dL (ref 12.0–15.0)
MCH: 29.4 pg (ref 26.0–34.0)
MCHC: 32.5 g/dL (ref 30.0–36.0)
MCV: 90.6 fL (ref 80.0–100.0)
Platelets: 431 10*3/uL — ABNORMAL HIGH (ref 150–400)
RBC: 4.59 MIL/uL (ref 3.87–5.11)
RDW: 13.4 % (ref 11.5–15.5)
WBC: 31.7 10*3/uL — ABNORMAL HIGH (ref 4.0–10.5)
nRBC: 0 % (ref 0.0–0.2)

## 2019-05-07 LAB — COMPREHENSIVE METABOLIC PANEL
ALT: 489 U/L — ABNORMAL HIGH (ref 0–44)
ALT: 357 U/L — ABNORMAL HIGH (ref 0–44)
AST: 219 U/L — ABNORMAL HIGH (ref 15–41)
AST: 176 U/L — ABNORMAL HIGH (ref 15–41)
Albumin: 2.5 g/dL — ABNORMAL LOW (ref 3.5–5.0)
Albumin: 2.3 g/dL — ABNORMAL LOW (ref 3.5–5.0)
Alkaline Phosphatase: 176 U/L — ABNORMAL HIGH (ref 38–126)
Alkaline Phosphatase: 169 U/L — ABNORMAL HIGH (ref 38–126)
Anion gap: 12 (ref 5–15)
Anion gap: 9 (ref 5–15)
BUN: 24 mg/dL — ABNORMAL HIGH (ref 6–20)
BUN: 24 mg/dL — ABNORMAL HIGH (ref 6–20)
CO2: 21 mmol/L — ABNORMAL LOW (ref 22–32)
CO2: 21 mmol/L — ABNORMAL LOW (ref 22–32)
Calcium: 13 mg/dL — ABNORMAL HIGH (ref 8.9–10.3)
Calcium: 11.8 mg/dL — ABNORMAL HIGH (ref 8.9–10.3)
Chloride: 109 mmol/L (ref 98–111)
Chloride: 106 mmol/L (ref 98–111)
Creatinine, Ser: 1.5 mg/dL — ABNORMAL HIGH (ref 0.44–1.00)
Creatinine, Ser: 1.6 mg/dL — ABNORMAL HIGH (ref 0.44–1.00)
GFR calc Af Amer: 47 mL/min — ABNORMAL LOW (ref >60)
GFR calc Af Amer: 44 mL/min — ABNORMAL LOW (ref >60)
GFR calc non Af Amer: 41 mL/min — ABNORMAL LOW (ref >60)
GFR calc non Af Amer: 38 mL/min — ABNORMAL LOW (ref >60)
Glucose, Bld: 78 mg/dL (ref 70–99)
Glucose, Bld: 107 mg/dL — ABNORMAL HIGH (ref 70–99)
Potassium: 4.1 mmol/L (ref 3.5–5.1)
Potassium: 5.5 mmol/L — ABNORMAL HIGH (ref 3.5–5.1)
Sodium: 142 mmol/L (ref 135–145)
Sodium: 136 mmol/L (ref 135–145)
Total Bilirubin: 2.5 mg/dL — ABNORMAL HIGH (ref 0.3–1.2)
Total Bilirubin: 2.4 mg/dL — ABNORMAL HIGH (ref 0.3–1.2)
Total Protein: 6.2 g/dL — ABNORMAL LOW (ref 6.5–8.1)
Total Protein: 5.3 g/dL — ABNORMAL LOW (ref 6.5–8.1)

## 2019-05-07 LAB — RESPIRATORY PANEL BY RT PCR (FLU A&B, COVID)
Influenza A by PCR: NEGATIVE
Influenza B by PCR: NEGATIVE
SARS Coronavirus 2 by RT PCR: NEGATIVE

## 2019-05-07 LAB — HIV ANTIBODY (ROUTINE TESTING W REFLEX): HIV Screen 4th Generation wRfx: NONREACTIVE

## 2019-05-07 LAB — LIPASE, BLOOD: Lipase: 16 U/L (ref 11–51)

## 2019-05-07 LAB — MAGNESIUM: Magnesium: 2.1 mg/dL (ref 1.7–2.4)

## 2019-05-07 LAB — TSH: TSH: 3.746 u[IU]/mL (ref 0.350–4.500)

## 2019-05-07 SURGERY — ERCP, WITH INTERVENTION IF INDICATED
Anesthesia: General

## 2019-05-07 MED ORDER — GADOBUTROL 1 MMOL/ML IV SOLN
5.5000 mL | Freq: Once | INTRAVENOUS | Status: AC | PRN
  Administered 2019-05-07: 5.5 mL via INTRAVENOUS

## 2019-05-07 MED ORDER — PIPERACILLIN-TAZOBACTAM 3.375 G IVPB
3.3750 g | Freq: Three times a day (TID) | INTRAVENOUS | Status: DC
  Administered 2019-05-07 (×2): 3.375 g via INTRAVENOUS
  Filled 2019-05-07 (×2): qty 50

## 2019-05-07 MED ORDER — IOHEXOL 350 MG/ML SOLN
100.0000 mL | Freq: Once | INTRAVENOUS | Status: AC
  Administered 2019-05-07: 55 mL via INTRAVENOUS
  Administered 2019-05-07: 100 mL via INTRAVENOUS

## 2019-05-07 MED ORDER — SODIUM CHLORIDE 0.9 % IV SOLN: INTRAVENOUS | Status: DC

## 2019-05-07 MED ORDER — SODIUM CHLORIDE 0.9% FLUSH
10.0000 mL | Freq: Two times a day (BID) | INTRAVENOUS | Status: DC
  Administered 2019-05-07 – 2019-05-14 (×7): 10 mL

## 2019-05-07 MED ORDER — TRIAMCINOLONE ACETONIDE 55 MCG/ACT NA AERO: 2.0000 | INHALATION_SPRAY | Freq: Every day | NASAL | Status: DC | PRN

## 2019-05-07 MED ORDER — SODIUM CHLORIDE 0.9 % IV SOLN
2.0000 g | Freq: Two times a day (BID) | INTRAVENOUS | Status: DC
  Administered 2019-05-07 – 2019-05-11 (×9): 2 g via INTRAVENOUS
  Filled 2019-05-07 (×10): qty 2

## 2019-05-07 MED ORDER — SODIUM CHLORIDE 0.9 % IV BOLUS
1000.0000 mL | Freq: Once | INTRAVENOUS | Status: AC
  Administered 2019-05-07: 1000 mL via INTRAVENOUS

## 2019-05-07 MED ORDER — ZOLEDRONIC ACID 4 MG/5ML IV CONC
4.0000 mg | Freq: Once | INTRAVENOUS | Status: AC
  Administered 2019-05-07: 4 mg via INTRAVENOUS
  Filled 2019-05-07: qty 5

## 2019-05-07 MED ORDER — VANCOMYCIN HCL 750 MG/150ML IV SOLN
750.0000 mg | INTRAVENOUS | Status: DC
  Administered 2019-05-07 – 2019-05-08 (×2): 750 mg via INTRAVENOUS
  Filled 2019-05-07 (×2): qty 150

## 2019-05-07 MED ORDER — FENTANYL CITRATE (PF) 100 MCG/2ML IJ SOLN
12.5000 ug | INTRAMUSCULAR | Status: DC | PRN
  Administered 2019-05-07 – 2019-05-08 (×5): 12.5 ug via INTRAVENOUS
  Filled 2019-05-07 (×5): qty 2

## 2019-05-07 MED ORDER — LORATADINE 10 MG PO TABS
10.0000 mg | ORAL_TABLET | Freq: Every day | ORAL | Status: DC
  Administered 2019-05-07 – 2019-05-14 (×8): 10 mg via ORAL
  Filled 2019-05-07 (×8): qty 1

## 2019-05-07 MED ORDER — ALTEPLASE 2 MG IJ SOLR
2.0000 mg | Freq: Once | INTRAMUSCULAR | Status: AC
  Administered 2019-05-07: 2 mg
  Filled 2019-05-07: qty 2

## 2019-05-07 MED ORDER — AMITRIPTYLINE HCL 10 MG PO TABS
10.0000 mg | ORAL_TABLET | Freq: Every evening | ORAL | Status: DC | PRN
  Administered 2019-05-11: 10 mg via ORAL
  Filled 2019-05-07: qty 1

## 2019-05-07 MED ORDER — CHLORHEXIDINE GLUCONATE CLOTH 2 % EX PADS
6.0000 | MEDICATED_PAD | Freq: Every day | CUTANEOUS | Status: DC
  Administered 2019-05-07 – 2019-05-14 (×8): 6 via TOPICAL

## 2019-05-07 MED ORDER — LISINOPRIL 20 MG PO TABS
20.0000 mg | ORAL_TABLET | Freq: Every day | ORAL | Status: DC
Start: 2019-05-07 — End: 2019-05-07
  Administered 2019-05-07: 20 mg via ORAL
  Filled 2019-05-07: qty 1

## 2019-05-07 MED ORDER — SODIUM CHLORIDE 0.9% FLUSH
10.0000 mL | INTRAVENOUS | Status: DC | PRN
  Administered 2019-05-13: 10 mL

## 2019-05-07 MED ORDER — OXYCODONE HCL 5 MG PO TABS
5.0000 mg | ORAL_TABLET | Freq: Three times a day (TID) | ORAL | Status: DC | PRN
  Administered 2019-05-07 – 2019-05-13 (×15): 5 mg via ORAL
  Filled 2019-05-07 (×15): qty 1

## 2019-05-07 NOTE — Progress Notes (Addendum)
Daily Rounding Note  05/07/2019, 8:57 AM  LOS: 1 day   SUBJECTIVE:   Chief complaint: abnormal LFTs, leukocytosis, bile duct dilated.  Malignant melanoma   Pain in right axillary, arm continue.  No diarrhea.  Nausea but no vomiting.  Anorexia persists. WBCs 39 >> 34.9. T bili 2 (1.2 on 03/03/19).  Alk phos 225 (203 in 02/2019).  AST/ALT 46/741 (1298/1894 in 02/2019).  Albumin 2.4.  Lipase 16. INR 1.3 BUN/creatinine 24/1.4. IgG normal.  Monospot negative.   Pndg: Alpha 1 antitrypsin, ceruloplasmin, Epstein-Barr, HSV IgM,  ANA, smooth muscle Ab  OBJECTIVE:         Vital signs in last 24 hours:    Temp:  [97.7 F (36.5 C)-98.5 F (36.9 C)] 97.8 F (36.6 C) (03/12 0517) Pulse Rate:  [82-140] 82 (03/12 0517) Resp:  [15-18] 18 (03/12 0316) BP: (102-118)/(60-86) 111/73 (03/12 0517) SpO2:  [94 %-100 %] 100 % (03/12 0517) Weight:  [57.1 kg-57.2 kg] 57.1 kg (03/12 0107) Last BM Date: 05/04/19 Filed Weights   05/07/19 0107  Weight: 57.1 kg   General: Pleasant.  Non-ill-appearing.  Comfortable at rest. Heart: RRR. Chest: Clear bilaterally.  There is a soft tissue swelling about 6 cm in diameter on the posterior right thoracic chest, superior aspect begins at lower scapula.  This is tender. Breast: Surgical incision in RUQ is intact, some discoloration bruising there and into axilla.   Abdomen: Soft.  No masses.  No tenderness.  Active bowel sounds.  Not distended. Extremities: Nonpitting swelling in the upper right arm and axilla.  Axilla is tender. Neuro/Psych: Fully alert and oriented.  Good historian.  No tremors, no gross deficits.   Lab Results: Recent Labs    05/06/19 1056 05/07/19 0148  WBC 39.0 Repeated and verified X2.* 34.9*  HGB 14.7 13.2  HCT 44.3 41.7  PLT 508.0 Repeated and verified X2.* 460*   BMET Recent Labs    05/07/19 0148  NA 141  K 3.7  CL 108  CO2 20*  GLUCOSE 79  BUN 24*  CREATININE 1.45*   CALCIUM 12.5*   LFT Recent Labs    05/06/19 1056 05/07/19 0148  PROT 7.2  --   ALBUMIN 3.2* 2.4*  AST 486*  --   ALT 741*  --   ALKPHOS 225*  --   BILITOT 2.0*  --   BILIDIR 1.5*  --    PT/INR Recent Labs    05/06/19 1056  LABPROT 14.1*  INR 1.3*    CT Angio Chest PE W and/or Wo Contrast  Result Date: 05/07/2019 CLINICAL DATA:  History of melanoma. Axillary biopsy 5 weeks ago. Pain, swelling and inability to raise the right arm. No chest pain or shortness of breath. EXAM: CT ANGIOGRAPHY CHEST WITH CONTRAST TECHNIQUE: Multidetector CT imaging of the chest was performed using the standard protocol during bolus administration of intravenous contrast. Multiplanar CT image reconstructions and MIPs were obtained to evaluate the vascular anatomy. CONTRAST:  60 cc OMNIPAQUE IOHEXOL 350 MG/ML SOLN COMPARISON:  Chest CT 04/28/2019 FINDINGS: Cardiovascular: The heart is normal in size. No pericardial effusion. The aorta is normal in caliber. No dissection. The branch vessels are patent. No coronary artery calcifications. The pulmonary arterial tree is well opacified. No filling defects to suggest pulmonary embolism. Mediastinum/Nodes: Small scattered mediastinal and hilar lymph nodes are stable. No mass or overt adenopathy. The esophagus is grossly normal. The thyroid gland is normal. Lungs/Pleura: New small right pleural effusion  with overlying atelectasis. No focal infiltrates or worrisome pulmonary nodules. Upper Abdomen: No significant upper abdominal findings. Chest wall/musculoskeletal: Extensive and progressive infiltrating tumor in the right axilla and invading the right chest wall. This measures a maximum of 10.2 x 6.2 cm on image 47/6. This previously measured 8.1 x 3.4 cm. Tumor is now bulging the pleura. Do not see any definite direct bony invasion or destruction. Suspect direct invasion of the latissimus dorsi, serratus anterior and subscapularis muscles. Review of the MIP images  confirms the above findings. IMPRESSION: 1. No CT findings for pulmonary embolism. 2. Extensive, aggressive and progressive infiltrating tumor in the right axilla. This is invading the right chest wall and involving the right chest wall musculature as above. 3. No mediastinal or hilar mass or adenopathy. 4. New small right pleural effusion with overlying atelectasis. No definite metastatic pulmonary nodules. Electronically Signed   By: Marijo Sanes M.D.   On: 05/07/2019 07:55   MR ABDOMEN MRCP W WO CONTAST  Result Date: 05/07/2019 CLINICAL DATA:  Acute cholangitis. Mild biliary ductal dilatation. Leukocytosis. Melanoma and right breast carcinoma. EXAM: MRI ABDOMEN WITHOUT AND WITH CONTRAST (INCLUDING MRCP) TECHNIQUE: Multiplanar multisequence MR imaging of the abdomen was performed both before and after the administration of intravenous contrast. Heavily T2-weighted images of the biliary and pancreatic ducts were obtained, and three-dimensional MRCP images were rendered by post processing. CONTRAST:  5.1m GADAVIST GADOBUTROL 1 MMOL/ML IV SOLN COMPARISON:  Ultrasound on 05/06/2019 and CT on 04/28/2019 FINDINGS: Lower chest: Small right pleural effusion. Subcutaneous edema in right chest and abdominal wall. Small fluid collection in right breast, likely representing a postop seroma. Hepatobiliary: No evidence of hepatic mass or abscess. Unremarkable appearance of gallbladder. Common bile duct measures 8 mm in diameter. No evidence of common bile duct wall thickening or enhancement. No evidence of choledocholithiasis or biliary stricture. Pancreas: No mass or inflammatory changes. No evidence of pancreatic ductal dilatation or pancreas divisum. Spleen:  Within normal limits in size and appearance. Adrenals/Urinary Tract: No masses identified. No evidence of hydronephrosis. Stomach/Bowel: Visualized portion unremarkable. Vascular/Lymphatic: No pathologically enlarged lymph nodes identified. No abdominal aortic  aneurysm. Other:  None. Musculoskeletal:  No suspicious bone lesions identified. IMPRESSION: 1. No evidence of biliary obstruction or choledocholithiasis. 2. No evidence of hepatic abscess or mass. 3. Small right pleural effusion, and right chest and abdominal wall edema. Probable postop seroma in the right breast. Electronically Signed   By: JMarlaine HindM.D.   On: 05/07/2019 08:05   UKoreaAbdomen Limited RUQ  Result Date: 05/06/2019 CLINICAL DATA:  Right upper quadrant abdominal pain. EXAM: ULTRASOUND ABDOMEN LIMITED RIGHT UPPER QUADRANT COMPARISON:  CT 04/28/2019 [facility] Gallbladder: Distended. No gallstones or wall thickening visualized. No sonographic Murphy sign noted by sonographer. Common bile duct: Diameter: Dilated measuring 10 mm at the porta hepatis, 8 mm distally. Liver: No focal lesion identified. Within normal limits in parenchymal echogenicity. Portal vein is patent on color Doppler imaging with normal direction of blood flow towards the liver. Other: Right pleural effusion, not seen on recent CT. IMPRESSION: 1. Biliary dilatation with common bile duct measuring 10 mm. This is new from CT 1 week ago. Recommend further evaluation with MRCP or ERCP. 2. Gallbladder is mildly distended, but otherwise normal. No gallstones. 3. Right pleural effusion incidentally noted, new from prior CT. Electronically Signed   By: MKeith RakeM.D.   On: 05/06/2019 20:50   Scheduled Meds: . Chlorhexidine Gluconate Cloth  6 each Topical Daily  .  lisinopril  20 mg Oral QHS  . loratadine  10 mg Oral Daily  . sodium chloride flush  10-40 mL Intracatheter Q12H   Continuous Infusions: . piperacillin-tazobactam (ZOSYN)  IV 12.5 mL/hr at 05/07/19 0600  . vancomycin     PRN Meds:.amitriptyline, oxyCODONE, sodium chloride flush, triamcinolone  ASSESMENT:   *    Elevated LFTs w anorexia, nausea, malaise, fatigue. IgG normal.  Monospot negative.   Pndg: Alpha 1 antitrypsin,  ceruloplasmin, Epstein-Barr, HSV IgM,  ANA, smooth muscle Ab 04/28/2019 CTAP/chest unrevealing with normal liver, no signs of metastasis. 05/06/2019 ultrasound: CBD 10 mm, new compared with CT of 1 week prior.  Mild gallbladder distention, otherwise normal GB.  New right pleural effusion C/W recent CT. 05/06/2019 MRCP: CBD 8 mm, gallbladder unremarkable.  Pancreas, liver normal. Small right pleural effusion with SQ right chest and abdominal wall edema.  Small right breast fluid collection, likely postop seroma. 05/07/2019 CT angio chest: Progressive infiltrating tumor in the right axilla invading right chest wall and musculature.  New, small, right pleural effusion with ATX.  No definitive pulmonary nodules/mets.   *   Elevated WBCs. ? due to process in right axilla vs cholangitis?  Zosyn, vancomycin in place.  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   02/16/19 bx right breast, axillary node biopsy: sclerosing breast lesion with ductal hyperplasia, nodes positive for melanoma.   03/31/2019 surgical resection melanoma from arm, 7/19 nodes positive for melanoma Oncology has yet to initiate immunotherapy due to unexplained LFT elevation.  CT chest this AM: tumor invading axilla and chest wall.  New Right pleural effusion.    *    Mild coagulopathy.   PLAN   *  Proceed w ERCP 12:30 today.  Risks of pancreatitis, bleeding, respiratory distress discussed with patient, she is agreeable to proceed.  *   gen surgery will evaluate.  Consult discussed with surgical PA.  *     Continue antibiotics, Zosyn/vancomycin.   Azucena Freed  05/07/2019, 8:57 AM Phone [phone]   ________________________________________________________________________  Velora Heckler GI MD note:  I personally examined the patient, reviewed the data and agree with the assessment and plan described above.  I am concerned about her right arm, right shoulder pains, aggressively enlarging right axillary mass and have asked  surgery team here to evaluate her.  I am not certain why her LFTs have been so elevated but overall it seems very unlikely that she has biliary infection or cause of her elevated liver tests. She did take 3 doses of diflucan 4-5 weeks ago and that is known to cause hepatotoxicity. She has has no abdominal pains at all and her LFT pattern is much more consistent with an acute hepatitis than biliary obstruction, infection.   Labs 3/4  AST 1298, ALT 1894, Alk Phos 203, TBili 1.2, LDH 1521; (I spoke with [place] about viral testing and they read her labs to me): Hepatitis A IgM negative, Hepatitis B Surface Antigen negative, Hepatitis B Core IgM negative, Hepatitis C Ab negative. Labs 3/11 AST 486, ALT 741, Alk phos 225, T Bili 2 (direct 1.5) LFTs this morning still pending at 10AM  For now, no plans for ERCP. Would continue empiric IV antibiotics and await results from other viral hepatitis etiologies, ANA. I added CMV IgM. Will allow regular diet.  We will follow along.  Owens Loffler, MD [facility] Pager [phone]

## 2019-05-07 NOTE — Progress Notes (Signed)
PROGRESS NOTE    Angelica Dunn  [id number] DOB: Jul 10, 1970 DOA: 05/06/2019 PCP: Renaldo Reel, PA    Brief Narrative:  HPI per Dr. Dana Allan on 05/06/19 Patient is a 49 year old lady with history of recurrent melanoma with lymph node involvement.  Patient underwent right axillary lymph node dissection on March 31, 2019, as well as, placement of port for possible immunotherapy.  Postop.  Has been complicated by significantly elevated liver enzymes.  Patient was referred to the GI team by the PCP, patient was seen earlier today.  Liver enzymes remain significantly elevated.  Additionally, patient has significant leukocytosis, with WBC of 39,000.  Patient denies fever, chills, headache, neck pain, URI symptoms, chest pain, shortness of breath, GI symptoms or urinary symptoms.  Abdominal ultrasound revealed biliary dilatation with common bile duct measuring 10 mm.  Gallbladder was said to be mildly distended, but otherwise normal.  No gallstones.  Right-sided pleural effusion was incidentally noted, which is said to be new.  Patient will be admitted for further assessment and management.  ED Course: On presentation to the hospital, vitals revealed temperature of 98.5, heart rate of 140 140 bpm, respiratory rate of 15, blood pressure of 110/74 mmHg and O2 sat of 99%.  Patient has been pancultured by the ER providers.  Patient will be started IV vancomycin and Zosyn.  Pertinent labs: CBC reveals WBC of 39, hemoglobin of 14.7, hematocrit of 44.3 with platelet count of 500.  Test revealed ALT of 741, AST of 486, alkaline phosphatase of 225, albumin of 3.2, direct bilirubin of 1.5 with total bilirubin of 2 and total protein of 7.2.  Imaging: independently reviewed.   Review of Systems:  Negative for fever, visual changes, sore throat, rash, new muscle aches, chest pain, SOB, dysuria, bleeding, n/v/abdominal pain.  Patient has right axillary discomfort/pain following lymph node  dissection on March 30, 2019.  **Interim History Patient was still complaining of some right axillary pain.  Happy to learn that her LFTs are trending down.  Will start on IV fluids and diet given that no ERCP is to be done currently.  General Surgery recommends continuing IV antibiotics at this time empirically.   Assessment & Plan:   Active Problems:   Leukocytosis  Leukocytosis with SIRS-like picture, present on admission -Admitted patient to inpatient MedSurg -Patient was tachycardic and has a leukocytosis but currently has no source of infection -WBC was close to 40,000 on admission is now trending downwards and WBC is now 31,700 -Panculture patient and obtain blood cultures, urine culture, urinalysis as well as a respiratory virus panel and mono screen -MRI of the abdomen as below -Broad-spectrum antibiotics changed to IV Vancomycin/IV Cefepime now -General surgery recommending just IV antibiotics -Started the patient on normal saline at 75 mils per hour; 1 L of normal saline in the ED -Follow cultures -Procalcitonin level is 1.50 -Repeat CBC -Obtain documentation from patient's oncologist, Dr. Bobby Rumpf, based in [place], [facility]). -Further management depend on hospital course.  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 -She has had 7 out of 19 lymph nodes positive for metastatic melanoma and breast lesion was complex sclerosing lesion -He has a port in place but has not started immunotherapy due to her abnormalities -Immunotherapy is currently on hold due to abnormal liver function tests and will be starting outpatient -GI team is managing abnormal LFTs and recommends continue to monitor for now and no ERCP. -  Patient is known to an oncologist based [place], [place] have consulted medical oncology for further evaluation recommendations and they feel that  no inpatient treatment is recommended at this time -CT of the chest PE was done and showed "No CT findings for pulmonary embolism. Extensive, aggressive and progressive infiltrating tumor in the right axilla. This is invading the right chest wall and involving the right chest wall musculature as above. No mediastinal or hilar mass or adenopathy.  New small right pleural effusion with overlying atelectasis. No definite metastatic pulmonary nodules." -We will continue to monitor carefully and appreciate further oncology recommendations  Right axillary pain secondary to above -We will oxycodone and have added IV fentanyl -Received IV morphine in the ED yesterday  Renal insufficiency and question if this is chronic kidney disease Metabolic acidosis, mild -BUN/creatinine went from 24/1.45 is now 24/1.5 -Started IV fluid hydration as above -Patient's CO2 was 21, chloride level was 109, anion gap was 12 -Avoid nephrotoxic medications, contrast dyes, hypotension and I have stopped her lisinopril and will change her IV Zosyn to IV cefepime -Renally adjust medications -Continue monitor trend liver function panel -Repeat CMP in a.m.  Hypercalcemia -Likely malignancy in the setting of melanoma -Started on normal saline at 75 MLS per hour -Hematology/Oncology consulted for further evaluation and will check PTHrP and PTH -We will give Zometa 4 mg IV x1  -Continue monitor and trend calcium level  Thrombocytosis  -Trending downwards -Patient's platelet count went from 508,000 -> 431,000 -Continue to Monitor and Trend -Repeat CBC in AM   Abnormal LFTs Hyperbilirubinemia, worsening -Trending down now and went from an AST of 486 down to 219, and an ALT of 741 is now 489; Was higher on 3/4 -Her EBV IgG was 409 and IgM was less than 36.0 and an EBV NA IgG was 51.8 -MRI/MRCP of the abdomen showed "No evidence of biliary obstruction or choledocholithiasis. No evidence of hepatic abscess or mass. Small  right pleural effusion, and right chest and abdominal wall edema. Probable postop seroma in the right breast." -Mono screen is negative -Alpha-1 antitrypsin serum level was greater than 300 -Ceruloplasmin was 44 -Continue to monitor LFTs and trend -GI recommending no plans for ERCP and recommends continue empiric IV antibiotics and is waiting other viral hepatology's and have added an ANA as well as a CMV IgM -Continue to follow final Hepatology's and GI recommendations  DVT prophylaxis: SCDs Code Status: FULL CODE  Family Communication: No family present at bedside  Disposition Plan: Patient is from home and now has significant leukocytosis and malaise and will need pending further work-up of her leukocytosis and improvement of LFTs and clearance by GI, general surgery as well as hematology oncology   Consultants:   Gastroenterology  General Surgery  Medical Oncology    Procedures: MRCP   Antimicrobials:  Anti-infectives (From admission, onward)   Start     Dose/Rate Route Frequency Ordered Stop   05/07/19 2200  vancomycin (VANCOREADY) IVPB 750 mg/150 mL     750 mg 150 mL/hr over 60 Minutes Intravenous Every 24 hours 05/07/19 0410     05/07/19 1330  ceFEPIme (MAXIPIME) 2 g in sodium chloride 0.9 % 100 mL IVPB     2 g 200 mL/hr over 30 Minutes Intravenous Every 12 hours 05/07/19 1239     05/07/19 0600  piperacillin-tazobactam (ZOSYN) IVPB 3.375 g  Status:  Discontinued     3.375 g 12.5 mL/hr over 240 Minutes Intravenous Every 8 hours 05/07/19 0410 05/07/19  1238   05/06/19 2145  piperacillin-tazobactam (ZOSYN) IVPB 3.375 g     3.375 g 100 mL/hr over 30 Minutes Intravenous  Once 05/06/19 2144 05/06/19 2232   05/06/19 2145  vancomycin (VANCOREADY) IVPB 1250 mg/250 mL     1,250 mg 166.7 mL/hr over 90 Minutes Intravenous  Once 05/06/19 2144 05/07/19 0004     Subjective: Patient examined at bedside and was complaining of some right axillary pain.  No nausea or vomiting.   Denies any abdominal pain.  No other concerns or complaints at this time.  Objective: Vitals:   05/07/19 0316 05/07/19 0517 05/07/19 1013 05/07/19 1453  BP: 102/69 111/73 116/79 107/67  Pulse: 98 82 84 (!) 101  Resp: 18     Temp: 97.9 F (36.6 C) 97.8 F (36.6 C) 98.5 F (36.9 C) 98.4 F (36.9 C)  TempSrc: Oral Oral Oral Oral  SpO2: 100% 100% 99% 99%  Weight:      Height:        Intake/Output Summary (Last 24 hours) at 05/07/2019 1714 Last data filed at 05/07/2019 1239 Gross per 24 hour  Intake 1778.1 ml  Output 700 ml  Net 1078.1 ml   Filed Weights   05/07/19 0107  Weight: 57.1 kg   Examination: Physical Exam:  Constitutional: Thin Caucasian female currently in NAD and appears calm slightly uncomfortable secondary to the pain from her axilla  Eyes: Lids and conjunctivae normal, sclerae anicteric  ENMT: External Ears, Nose appear normal. Grossly normal hearing.  Neck: Appears normal, supple, no cervical masses, normal ROM, no appreciable thyromegaly; no JVD Respiratory: Slightly diminished to auscultation bilaterally with some mild crackles on the right side compared to left, no wheezing, rales, rhonchi. Normal respiratory effort and patient is not tachypenic. No accessory muscle use.  Unlabored breathing Cardiovascular: RRR, no murmurs / rubs / gallops. S1 and S2 auscultated.  Abdomen: Soft, non-tender, non-distended. Bowel sounds positive.  GU: Deferred. Musculoskeletal: No clubbing / cyanosis of digits/nails. No joint deformity upper and lower extremities.  Skin: Has an indurated right axilla that is painful to palpate and appears indurated  Neurologic: CN 2-12 grossly intact with no focal deficits. Romberg sign and cerebellar reflexes not assessed.  Psychiatric: Normal judgment and insight. Alert and oriented x 3. Normal mood and appropriate affect.   Data Reviewed: I have personally reviewed following labs and imaging studies  CBC: Recent Labs  Lab 05/06/19 1056  05/07/19 0148 05/07/19 1043  WBC 39.0 Repeated and verified X2.* 34.9* 31.7*  NEUTROABS 35.0* 29.8*  --   HGB 14.7 13.2 13.5  HCT 44.3 41.7 41.6  MCV 88.8 92.5 90.6  PLT 508.0 Repeated and verified X2.* 460* [id number]*   Basic Metabolic Panel: Recent Labs  Lab 05/07/19 0148 05/07/19 1043  NA 141 142  K 3.7 4.1  CL 108 109  CO2 20* 21*  GLUCOSE 79 78  BUN 24* 24*  CREATININE 1.45* 1.50*  CALCIUM 12.5* 13.0*  MG 2.1  --   PHOS 4.8*  --    GFR: Estimated Creatinine Clearance: 37.9 mL/min (A) (by C-G formula based on SCr of 1.5 mg/dL (H)). Liver Function Tests: Recent Labs  Lab 05/06/19 1056 05/07/19 0148 05/07/19 1043  AST 486*  --  219*  ALT 741*  --  489*  ALKPHOS 225*  --  176*  BILITOT 2.0*  --  2.5*  PROT 7.2  --  6.2*  ALBUMIN 3.2* 2.4* 2.5*   Recent Labs  Lab 05/07/19 0148  LIPASE 16  No results for input(s): AMMONIA in the last 168 hours. Coagulation Profile: Recent Labs  Lab 05/06/19 1056  INR 1.3*   Cardiac Enzymes: No results for input(s): CKTOTAL, CKMB, CKMBINDEX, TROPONINI in the last 168 hours. BNP (last 3 results) No results for input(s): PROBNP in the last 8760 hours. HbA1C: No results for input(s): HGBA1C in the last 72 hours. CBG: No results for input(s): GLUCAP in the last 168 hours. Lipid Profile: No results for input(s): CHOL, HDL, LDLCALC, TRIG, CHOLHDL, LDLDIRECT in the last 72 hours. Thyroid Function Tests: Recent Labs    05/06/19 1929 05/06/19 1929 05/07/19 0148  TSH 3.450   < > 3.746  FREET4 1.79*  --   --    < > = values in this interval not displayed.   Anemia Panel: No results for input(s): VITAMINB12, FOLATE, FERRITIN, TIBC, IRON, RETICCTPCT in the last 72 hours. Sepsis Labs: Recent Labs  Lab 05/07/19 0148  PROCALCITON 1.50    Recent Results (from the past 240 hour(s))  Blood culture (routine x 2)     Status: None (Preliminary result)   Collection Time: 05/06/19  9:42 PM   Specimen: BLOOD  Result Value Ref  Range Status   Specimen Description BLOOD LEFT ARM  Final   Special Requests   Final    BOTTLES DRAWN AEROBIC AND ANAEROBIC Blood Culture results may not be optimal due to an excessive volume of blood received in culture bottles   Culture   Final    NO GROWTH < 12 HOURS Performed at [facility], [place] [address], [place], [place] [address]    Report Status PENDING  Incomplete  Respiratory Panel by RT PCR (Flu A&B, Covid) - Nasopharyngeal Swab     Status: None   Collection Time: 05/06/19 10:35 PM   Specimen: Nasopharyngeal Swab  Result Value Ref Range Status   SARS Coronavirus 2 by RT PCR NEGATIVE NEGATIVE Final    Comment: (NOTE) SARS-CoV-2 target nucleic acids are NOT DETECTED. The SARS-CoV-2 RNA is generally detectable in upper respiratoy specimens during the acute phase of infection. The lowest concentration of SARS-CoV-2 viral copies this assay can detect is 131 copies/mL. A negative result does not preclude SARS-Cov-2 infection and should not be used as the sole basis for treatment or other patient management decisions. A negative result may occur with  improper specimen collection/handling, submission of specimen other than nasopharyngeal swab, presence of viral mutation(s) within the areas targeted by this assay, and inadequate number of viral copies (<131 copies/mL). A negative result must be combined with clinical observations, patient history, and epidemiological information. The expected result is Negative. Fact Sheet for Patients:  PinkCheek.be Fact Sheet for Healthcare Providers:  GravelBags.it This test is not yet ap proved or cleared by the [place] FDA and  has been authorized for detection and/or diagnosis of SARS-CoV-2 by FDA under an Emergency Use Authorization (EUA). This EUA will remain  in effect (meaning this test can be used) for the duration of the COVID-19 declaration under Section  564(b)(1) of the Act, 21 U.S.C. section 360bbb-3(b)(1), unless the authorization is terminated or revoked sooner.    Influenza A by PCR NEGATIVE NEGATIVE Final   Influenza B by PCR NEGATIVE NEGATIVE Final    Comment: (NOTE) The Xpert Xpress SARS-CoV-2/FLU/RSV assay is intended as an aid in  the diagnosis of influenza from Nasopharyngeal swab specimens and  should not be used as a sole basis for treatment. Nasal washings and  aspirates are unacceptable for  Xpert Xpress SARS-CoV-2/FLU/RSV  testing. Fact Sheet for Patients: PinkCheek.be Fact Sheet for Healthcare Providers: GravelBags.it This test is not yet approved or cleared by the [place] FDA and  has been authorized for detection and/or diagnosis of SARS-CoV-2 by  FDA under an Emergency Use Authorization (EUA). This EUA will remain  in effect (meaning this test can be used) for the duration of the  Covid-19 declaration under Section 564(b)(1) of the Act, 21  U.S.C. section 360bbb-3(b)(1), unless the authorization is  terminated or revoked. Performed at [facility], [place] [address] [place], [place] [address]      RN Pressure Injury Documentation:     Estimated body mass index is 22.3 kg/m as calculated from the following:   Height as of this encounter: 5\' 3"  (1.6 m).   Weight as of this encounter: 57.1 kg.  Malnutrition Type:      Malnutrition Characteristics:      Nutrition Interventions:     Radiology Studies: CT Angio Chest PE W and/or Wo Contrast  Result Date: 05/07/2019 CLINICAL DATA:  History of melanoma. Axillary biopsy 5 weeks ago. Pain, swelling and inability to raise the right arm. No chest pain or shortness of breath. EXAM: CT ANGIOGRAPHY CHEST WITH CONTRAST TECHNIQUE: Multidetector CT imaging of the chest was performed using the standard protocol during bolus administration of intravenous contrast. Multiplanar CT image  reconstructions and MIPs were obtained to evaluate the vascular anatomy. CONTRAST:  60 cc OMNIPAQUE IOHEXOL 350 MG/ML SOLN COMPARISON:  Chest CT 04/28/2019 FINDINGS: Cardiovascular: The heart is normal in size. No pericardial effusion. The aorta is normal in caliber. No dissection. The branch vessels are patent. No coronary artery calcifications. The pulmonary arterial tree is well opacified. No filling defects to suggest pulmonary embolism. Mediastinum/Nodes: Small scattered mediastinal and hilar lymph nodes are stable. No mass or overt adenopathy. The esophagus is grossly normal. The thyroid gland is normal. Lungs/Pleura: New small right pleural effusion with overlying atelectasis. No focal infiltrates or worrisome pulmonary nodules. Upper Abdomen: No significant upper abdominal findings. Chest wall/musculoskeletal: Extensive and progressive infiltrating tumor in the right axilla and invading the right chest wall. This measures a maximum of 10.2 x 6.2 cm on image 47/6. This previously measured 8.1 x 3.4 cm. Tumor is now bulging the pleura. Do not see any definite direct bony invasion or destruction. Suspect direct invasion of the latissimus dorsi, serratus anterior and subscapularis muscles. Review of the MIP images confirms the above findings. IMPRESSION: 1. No CT findings for pulmonary embolism. 2. Extensive, aggressive and progressive infiltrating tumor in the right axilla. This is invading the right chest wall and involving the right chest wall musculature as above. 3. No mediastinal or hilar mass or adenopathy. 4. New small right pleural effusion with overlying atelectasis. No definite metastatic pulmonary nodules. Electronically Signed   By: Marijo Sanes M.D.   On: 05/07/2019 07:55   MR ABDOMEN MRCP W WO CONTAST  Result Date: 05/07/2019 CLINICAL DATA:  Acute cholangitis. Mild biliary ductal dilatation. Leukocytosis. Melanoma and right breast carcinoma. EXAM: MRI ABDOMEN WITHOUT AND WITH CONTRAST  (INCLUDING MRCP) TECHNIQUE: Multiplanar multisequence MR imaging of the abdomen was performed both before and after the administration of intravenous contrast. Heavily T2-weighted images of the biliary and pancreatic ducts were obtained, and three-dimensional MRCP images were rendered by post processing. CONTRAST:  5.53mL GADAVIST GADOBUTROL 1 MMOL/ML IV SOLN COMPARISON:  Ultrasound on 05/06/2019 and CT on 04/28/2019 FINDINGS: Lower chest: Small right pleural effusion. Subcutaneous edema in  right chest and abdominal wall. Small fluid collection in right breast, likely representing a postop seroma. Hepatobiliary: No evidence of hepatic mass or abscess. Unremarkable appearance of gallbladder. Common bile duct measures 8 mm in diameter. No evidence of common bile duct wall thickening or enhancement. No evidence of choledocholithiasis or biliary stricture. Pancreas: No mass or inflammatory changes. No evidence of pancreatic ductal dilatation or pancreas divisum. Spleen:  Within normal limits in size and appearance. Adrenals/Urinary Tract: No masses identified. No evidence of hydronephrosis. Stomach/Bowel: Visualized portion unremarkable. Vascular/Lymphatic: No pathologically enlarged lymph nodes identified. No abdominal aortic aneurysm. Other:  None. Musculoskeletal:  No suspicious bone lesions identified. IMPRESSION: 1. No evidence of biliary obstruction or choledocholithiasis. 2. No evidence of hepatic abscess or mass. 3. Small right pleural effusion, and right chest and abdominal wall edema. Probable postop seroma in the right breast. Electronically Signed   By: Marlaine Hind M.D.   On: 05/07/2019 08:05   US Abdomen Limited RUQ  Result Date: 05/06/2019 CLINICAL DATA:  Right upper quadrant abdominal pain. EXAM: ULTRASOUND ABDOMEN LIMITED RIGHT UPPER QUADRANT COMPARISON:  CT 04/28/2019 at [place]: Gallbladder: Distended. No gallstones or wall thickening visualized. No sonographic Murphy sign noted  by sonographer. Common bile duct: Diameter: Dilated measuring 10 mm at the porta hepatis, 8 mm distally. Liver: No focal lesion identified. Within normal limits in parenchymal echogenicity. Portal vein is patent on color Doppler imaging with normal direction of blood flow towards the liver. Other: Right pleural effusion, not seen on recent CT. IMPRESSION: 1. Biliary dilatation with common bile duct measuring 10 mm. This is new from CT 1 week ago. Recommend further evaluation with MRCP or ERCP. 2. Gallbladder is mildly distended, but otherwise normal. No gallstones. 3. Right pleural effusion incidentally noted, new from prior CT. Electronically Signed   By: Keith Rake M.D.   On: 05/06/2019 20:50   Scheduled Meds:  Chlorhexidine Gluconate Cloth  6 each Topical Daily   lisinopril  20 mg Oral QHS   loratadine  10 mg Oral Daily   sodium chloride flush  10-40 mL Intracatheter Q12H   Continuous Infusions:  sodium chloride 75 mL/hr at 05/07/19 1024   ceFEPime (MAXIPIME) IV 2 g (05/07/19 1407)   vancomycin      LOS: 1 day   Kerney Elbe, DO [facility] PAGER is on AMION  If 7PM-7AM, please contact night-coverage [email or web address]

## 2019-05-07 NOTE — Progress Notes (Signed)
Pharmacy Antibiotic Note  Angelica Dunn is a 49 y.o. female admitted on 05/06/2019 with fatigue and leukocytosis, possible cholecystitis.  Pharmacy was consulted 05/06/19 for Vancomycin and Zosyn  Dosing. 3/12-- Zosyn discontinued. Pharmacy consulted to  change to Cefepime for sepsis.  WBC 39>34.9>31.7, afeb SCr 1.45>1.50, CrCl 37 ml/min    Plan: Start Cefepime 2 gm IV q12h Continue Vancomycin 750 mg IV q24h Est AUC 494, SCr used 1.45 Monitor clinical progress, renal function. Check steady state vancomycin trough per protocol if needed.     Height: 5\' 3"  (160 cm) Weight: 125 lb 14.1 oz (57.1 kg) IBW/kg (Calculated) : 52.4  Temp (24hrs), Avg:98 F (36.7 C), Min:97.7 F (36.5 C), Max:98.5 F (36.9 C)  Recent Labs  Lab 05/06/19 1056 05/07/19 0148 05/07/19 1043  WBC 39.0 Repeated and verified X2.* 34.9* 31.7*  CREATININE  --  1.45* 1.50*    Estimated Creatinine Clearance: 37.9 mL/min (A) (by C-G formula based on SCr of 1.5 mg/dL (H)).    Allergies  Allergen Reactions  . Gabapentin Other (See Comments)    "Made me feel odd, so I stopped taking it"    Antimicrobials: Cefepime 3/12>> Zosyn 3/11>>3/12 Vanc 3/11>>  Dose adjustments:  N/a  Microbiology: 3/11 BCx: ngtd <12 hr 3/12 BCx: sent 3/12 Ucx: sent   3/11 Covid: neg  Influenza A/B: neg 3/12 HIV: non reactive    Thank you for allowing pharmacy to be part of this patients care team. Nicole Cella, [place] Pharmacist [phone] Please check AMION for all [place] phone numbers After 10:00 PM, call [place] 05/07/2019 12:50 PM

## 2019-05-07 NOTE — Consult Note (Addendum)
Angelica Dunn  [phone] [phone]   MEDICAL ONCOLOGY - INITIAL CONSULTATION  Referral MD: Dr. Kerney Elbe  Reason for Referral: Leukocytosis and melanoma  HPI: Angelica Dunn is a 49 year old female with a history of recurrent melanoma with lymph node involvement, hypertension, hyperlipidemia.  Her melanoma was first diagnosed in 2019 the skin biopsy from her right flank and it recurred on/in her right breast and she underwent a lumpectomy and axillary lymph node dissection about 3 months ago.  7 of 19 lymph nodes were positive for metastatic melanoma.  Her breast lesion was a complex sclerosing lesion.  Her PET scan preoperatively did not show any distant metastatic disease and her recurrent melanoma was localized to the right axilla.  She was supposed to begin immunotherapy recently but this was postponed due to significant transaminitis. She has been followed at the [facility] by Dr. Bobby Rumpf.  Outside records from their office have been reviewed including their most recent note from 04/29/2019.  The patient has right axillary nodal recurrence of previous stage Ia (T1 a N0 M0) melanoma.  At her last visit, they reviewed the CT scans with her indicating that her liver looked normal and that the only abnormal finding was the extensive amount of soft tissue disease within the right axilla which was concerning for extension of her melanoma into the soft tissue/muscle of her right axilla/arm.  It was recommended for her to undergo a biopsy by interventional radiology of this soft tissue mass and this was performed at [facility] on 04/29/2019.  This showed a minute amount of high-grade malignant neoplasm consistent with melanoma in a background of scar tissue.  Due to her abnormal liver function, the patient was seen by GI.  The patient was sent to the hospital for abnormal lab work performed at her GI office. In their office, her LFTs and total bilirubin  were elevated and her WBC was also elevated.  On admission, her WBC was 39.0 and platelet count was elevated at 508,000.  Labs from earlier today showed a BUN of 24, creatinine 1.5, calcium 13.0, albumin 2.5, AST 219, ALT 49, alkaline phosphatase 176, and total bilirubin 2.5.  Ultrasound of the abdomen showed biliary dilatation with common bile duct measuring 10 mm which was new from the prior CT in 1 week prior.  MRCP showed no evidence of biliary obstruction, hepatic abscess or mass.  CT angiogram of the chest showed no PE but there was an extensive, aggressive and progressing infiltrating tumor in the right axilla which invades the right chest wall involving the right chest wall musculature.  General surgery and GI have consulted.  When seen today, the patient reports fatigue and generalized weakness.  She has also had a significant pain to the right axilla.  She denies recent fevers and chills.  Denies headaches and dizziness.  Reports that her appetite has been decreased.  She denies chest discomfort and shortness of breath.  Denies abdominal pain, nausea, vomiting, constipation, diarrhea.  No bleeding has been noted.  The patient lives in [place], [place].  She is divorced and has 1 daughter who is 17 years old.  Denies history of alcohol use.  She previously smoked a pack of cigarettes per day but quit 20 years ago.  Medical oncology was asked see the patient to make recommendations regarding her leukocytosis and melanoma.   Past Medical History:  Diagnosis Date  . No pertinent past medical history   :  Past Surgical  History:  Procedure Laterality Date  . BLADDER SUSPENSION  10/09/2011   Procedure: TRANSVAGINAL TAPE (TVT) PROCEDURE;  Surgeon: Daria Pastures, MD;  Location: [facility];  Service: Gynecology;  Laterality: N/A;  . BREAST EXCISIONAL BIOPSY Right   . CYSTOCELE REPAIR  10/09/2011   Procedure: ANTERIOR REPAIR (CYSTOCELE);  Surgeon: Daria Pastures, MD;  Location: [facility];   Service: Gynecology;  Laterality: N/A;  . CYSTOSCOPY  10/09/2011   Procedure: CYSTOSCOPY;  Surgeon: Daria Pastures, MD;  Location: [facility];  Service: Gynecology;  Laterality: N/A;  . VAGINAL HYSTERECTOMY  10/09/2011   Procedure: HYSTERECTOMY VAGINAL;  Surgeon: Daria Pastures, MD;  Location: [facility];  Service: Gynecology;  Laterality: N/A;  :  Current Facility-Administered Medications  Medication Dose Route Frequency Provider Last Rate Last Admin  . 0.9 %  sodium chloride infusion   Intravenous Continuous Raiford Noble [place], [place] 75 mL/hr at 05/07/19 1024 New Bag at 05/07/19 1024  . amitriptyline (ELAVIL) tablet 10-20 mg  10-20 mg Oral QHS PRN Dana Allan I, MD      . ceFEPIme (MAXIPIME) 2 g in sodium chloride 0.9 % 100 mL IVPB  2 g Intravenous Q12H Sheikh, Omair Latif, DO 200 mL/hr at 05/07/19 1407 2 g at 05/07/19 1407  . Chlorhexidine Gluconate Cloth 2 % PADS 6 each  6 each Topical Daily Bonnell Public, MD   6 each at 05/07/19 1034  . fentaNYL (SUBLIMAZE) injection 12.5 mcg  12.5 mcg Intravenous Q2H PRN Raiford Noble Latif, DO   12.5 mcg at 05/07/19 1227  . lisinopril (ZESTRIL) tablet 20 mg  20 mg Oral QHS Dana Allan I, MD   20 mg at 05/07/19 0146  . loratadine (CLARITIN) tablet 10 mg  10 mg Oral Daily Dana Allan I, MD   10 mg at 05/07/19 0800  . oxyCODONE (Oxy IR/ROXICODONE) immediate release tablet 5 mg  5 mg Oral TID PRN Dana Allan I, MD   5 mg at 05/07/19 0800  . sodium chloride flush (NS) 0.9 % injection 10-40 mL  10-40 mL Intracatheter Q12H Dana Allan I, MD   10 mL at 05/07/19 1034  . sodium chloride flush (NS) 0.9 % injection 10-40 mL  10-40 mL Intracatheter PRN Dana Allan I, MD      . triamcinolone (NASACORT) nasal inhaler 2 spray  2 spray Nasal Daily PRN Dana Allan I, MD      . vancomycin (VANCOREADY) IVPB 750 mg/150 mL  750 mg Intravenous Q24H Bonnell Public, MD         Allergies  Allergen Reactions  . Gabapentin Other  (See Comments)    "Made me feel odd, so I stopped taking it"  :  Family History  Problem Relation Age of Onset  . Breast cancer Maternal Aunt   . Breast cancer Cousin   :  Social History   Socioeconomic History  . Marital status: Divorced    Spouse name: Not on file  . Number of children: Not on file  . Years of education: Not on file  . Highest education level: Not on file  Occupational History  . Not on file  Tobacco Use  . Smoking status: Former Research scientist (life sciences)  . Smokeless tobacco: Never Used  Substance and Sexual Activity  . Alcohol use: Not Currently  . Drug use: No  . Sexual activity: Not on file  Other Topics Concern  . Not on file  Social History Narrative  . Not on file   Social Determinants  of Health   Financial Resource Strain:   . Difficulty of Paying Living Expenses:   Food Insecurity:   . Worried About Charity fundraiser in the Last Year:   . Arboriculturist in the Last Year:   Transportation Needs:   . Film/video editor (Medical):   Marland Kitchen Lack of Transportation (Non-Medical):   Physical Activity:   . Days of Exercise per Week:   . Minutes of Exercise per Session:   Stress:   . Feeling of Stress :   Social Connections:   . Frequency of Communication with Friends and Family:   . Frequency of Social Gatherings with Friends and Family:   . Attends Religious Services:   . Active Member of Clubs or Organizations:   . Attends Archivist Meetings:   Marland Kitchen Marital Status:   Intimate Partner Violence:   . Fear of Current or Ex-Partner:   . Emotionally Abused:   Marland Kitchen Physically Abused:   . Sexually Abused:   :  Review of Systems: A comprehensive 14 point review of systems was negative except as noted in the HPI.  Exam: Patient Vitals for the past 24 hrs:  BP Temp Temp src Pulse Resp SpO2 Height Weight  05/07/19 1453 107/67 98.4 F (36.9 C) Oral (!) 101 -- 99 % -- --  05/07/19 1013 116/79 98.5 F (36.9 C) Oral 84 -- 99 % -- --  05/07/19 0517  111/73 97.8 F (36.6 C) Oral 82 -- 100 % -- --  05/07/19 0316 102/69 97.9 F (36.6 C) Oral 98 18 100 % -- --  05/07/19 0107 108/74 97.7 F (36.5 C) Oral (!) 121 16 100 % 5\' 3"  (1.6 m) 57.1 kg  05/06/19 2315 110/74 -- -- (!) 105 15 99 % -- --  05/06/19 2300 117/76 -- -- (!) 107 16 99 % -- --  05/06/19 2245 105/68 -- -- (!) 104 16 99 % -- --  05/06/19 2230 108/69 -- -- (!) 108 16 99 % -- --  05/06/19 2215 107/66 -- -- (!) 108 17 99 % -- --  05/06/19 2200 108/66 -- -- (!) 110 16 99 % -- --  05/06/19 2115 108/70 -- -- (!) 111 17 98 % -- --  05/06/19 2111 112/74 -- -- (!) 117 17 100 % -- --  05/06/19 1828 103/86 98 F (36.7 C) Oral (!) 140 16 94 % -- --    General:  well-nourished in no acute distress.   Eyes:  no scleral icterus.   ENT:  There were no oropharyngeal lesions.   Neck was without thyromegaly.   Lymphatics: No lymphadenopathy noted in cervical or supraclavicular area.  Right axillary area incision with some ecchymoses but no erythema or drainage. Respiratory: lungs were clear bilaterally without wheezing or crackles.   Cardiovascular:  Regular rate and rhythm, S1/S2, without murmur, rub or gallop. There was no pedal edema.   GI:  abdomen was soft, flat, nontender, nondistended, without organomegaly.   Musculoskeletal:  no spinal tenderness of palpation of vertebral spine.   Skin: Some ecchymosis over the right axillary incision, no petechiae Neuro exam was nonfocal. Patient was alert and oriented.  Attention was good.   Language was appropriate.  Mood was normal without depression.  Speech was not pressured.  Thought content was not tangential.    Left chest Port-A-Cath without erythema  Lab Results  Component Value Date   WBC 31.7 (H) 05/07/2019   HGB 13.5 05/07/2019   HCT 41.6  05/07/2019   PLT 431 (H) 05/07/2019   GLUCOSE 78 05/07/2019   ALT 489 (H) 05/07/2019   AST 219 (H) 05/07/2019   NA 142 05/07/2019   K 4.1 05/07/2019   CL 109 05/07/2019   CREATININE 1.50  (H) 05/07/2019   BUN 24 (H) 05/07/2019   CO2 21 (L) 05/07/2019    CT Angio Chest PE W and/or Wo Contrast  Result Date: 05/07/2019 CLINICAL DATA:  History of melanoma. Axillary biopsy 5 weeks ago. Pain, swelling and inability to raise the right arm. No chest pain or shortness of breath. EXAM: CT ANGIOGRAPHY CHEST WITH CONTRAST TECHNIQUE: Multidetector CT imaging of the chest was performed using the standard protocol during bolus administration of intravenous contrast. Multiplanar CT image reconstructions and MIPs were obtained to evaluate the vascular anatomy. CONTRAST:  60 cc OMNIPAQUE IOHEXOL 350 MG/ML SOLN COMPARISON:  Chest CT 04/28/2019 FINDINGS: Cardiovascular: The heart is normal in size. No pericardial effusion. The aorta is normal in caliber. No dissection. The branch vessels are patent. No coronary artery calcifications. The pulmonary arterial tree is well opacified. No filling defects to suggest pulmonary embolism. Mediastinum/Nodes: Small scattered mediastinal and hilar lymph nodes are stable. No mass or overt adenopathy. The esophagus is grossly normal. The thyroid gland is normal. Lungs/Pleura: New small right pleural effusion with overlying atelectasis. No focal infiltrates or worrisome pulmonary nodules. Upper Abdomen: No significant upper abdominal findings. Chest wall/musculoskeletal: Extensive and progressive infiltrating tumor in the right axilla and invading the right chest wall. This measures a maximum of 10.2 x 6.2 cm on image 47/6. This previously measured 8.1 x 3.4 cm. Tumor is now bulging the pleura. Do not see any definite direct bony invasion or destruction. Suspect direct invasion of the latissimus dorsi, serratus anterior and subscapularis muscles. Review of the MIP images confirms the above findings. IMPRESSION: 1. No CT findings for pulmonary embolism. 2. Extensive, aggressive and progressive infiltrating tumor in the right axilla. This is invading the right chest wall and  involving the right chest wall musculature as above. 3. No mediastinal or hilar mass or adenopathy. 4. New small right pleural effusion with overlying atelectasis. No definite metastatic pulmonary nodules. Electronically Signed   By: Marijo Sanes M.D.   On: 05/07/2019 07:55   MR ABDOMEN MRCP W WO CONTAST  Result Date: 05/07/2019 CLINICAL DATA:  Acute cholangitis. Mild biliary ductal dilatation. Leukocytosis. Melanoma and right breast carcinoma. EXAM: MRI ABDOMEN WITHOUT AND WITH CONTRAST (INCLUDING MRCP) TECHNIQUE: Multiplanar multisequence MR imaging of the abdomen was performed both before and after the administration of intravenous contrast. Heavily T2-weighted images of the biliary and pancreatic ducts were obtained, and three-dimensional MRCP images were rendered by post processing. CONTRAST:  5.22mL GADAVIST GADOBUTROL 1 MMOL/ML IV SOLN COMPARISON:  Ultrasound on 05/06/2019 and CT on 04/28/2019 FINDINGS: Lower chest: Small right pleural effusion. Subcutaneous edema in right chest and abdominal wall. Small fluid collection in right breast, likely representing a postop seroma. Hepatobiliary: No evidence of hepatic mass or abscess. Unremarkable appearance of gallbladder. Common bile duct measures 8 mm in diameter. No evidence of common bile duct wall thickening or enhancement. No evidence of choledocholithiasis or biliary stricture. Pancreas: No mass or inflammatory changes. No evidence of pancreatic ductal dilatation or pancreas divisum. Spleen:  Within normal limits in size and appearance. Adrenals/Urinary Tract: No masses identified. No evidence of hydronephrosis. Stomach/Bowel: Visualized portion unremarkable. Vascular/Lymphatic: No pathologically enlarged lymph nodes identified. No abdominal aortic aneurysm. Other:  None. Musculoskeletal:  No suspicious bone lesions  identified. IMPRESSION: 1. No evidence of biliary obstruction or choledocholithiasis. 2. No evidence of hepatic abscess or mass. 3. Small  right pleural effusion, and right chest and abdominal wall edema. Probable postop seroma in the right breast. Electronically Signed   By: Marlaine Hind M.D.   On: 05/07/2019 08:05   US Abdomen Limited RUQ  Result Date: 05/06/2019 CLINICAL DATA:  Right upper quadrant abdominal pain. EXAM: ULTRASOUND ABDOMEN LIMITED RIGHT UPPER QUADRANT COMPARISON:  CT 04/28/2019 at [place]: Gallbladder: Distended. No gallstones or wall thickening visualized. No sonographic Murphy sign noted by sonographer. Common bile duct: Diameter: Dilated measuring 10 mm at the porta hepatis, 8 mm distally. Liver: No focal lesion identified. Within normal limits in parenchymal echogenicity. Portal vein is patent on color Doppler imaging with normal direction of blood flow towards the liver. Other: Right pleural effusion, not seen on recent CT. IMPRESSION: 1. Biliary dilatation with common bile duct measuring 10 mm. This is new from CT 1 week ago. Recommend further evaluation with MRCP or ERCP. 2. Gallbladder is mildly distended, but otherwise normal. No gallstones. 3. Right pleural effusion incidentally noted, new from prior CT. Electronically Signed   By: Keith Rake M.D.   On: 05/06/2019 20:50     CT Angio Chest PE W and/or Wo Contrast  Result Date: 05/07/2019 CLINICAL DATA:  History of melanoma. Axillary biopsy 5 weeks ago. Pain, swelling and inability to raise the right arm. No chest pain or shortness of breath. EXAM: CT ANGIOGRAPHY CHEST WITH CONTRAST TECHNIQUE: Multidetector CT imaging of the chest was performed using the standard protocol during bolus administration of intravenous contrast. Multiplanar CT image reconstructions and MIPs were obtained to evaluate the vascular anatomy. CONTRAST:  60 cc OMNIPAQUE IOHEXOL 350 MG/ML SOLN COMPARISON:  Chest CT 04/28/2019 FINDINGS: Cardiovascular: The heart is normal in size. No pericardial effusion. The aorta is normal in caliber. No dissection. The branch vessels  are patent. No coronary artery calcifications. The pulmonary arterial tree is well opacified. No filling defects to suggest pulmonary embolism. Mediastinum/Nodes: Small scattered mediastinal and hilar lymph nodes are stable. No mass or overt adenopathy. The esophagus is grossly normal. The thyroid gland is normal. Lungs/Pleura: New small right pleural effusion with overlying atelectasis. No focal infiltrates or worrisome pulmonary nodules. Upper Abdomen: No significant upper abdominal findings. Chest wall/musculoskeletal: Extensive and progressive infiltrating tumor in the right axilla and invading the right chest wall. This measures a maximum of 10.2 x 6.2 cm on image 47/6. This previously measured 8.1 x 3.4 cm. Tumor is now bulging the pleura. Do not see any definite direct bony invasion or destruction. Suspect direct invasion of the latissimus dorsi, serratus anterior and subscapularis muscles. Review of the MIP images confirms the above findings. IMPRESSION: 1. No CT findings for pulmonary embolism. 2. Extensive, aggressive and progressive infiltrating tumor in the right axilla. This is invading the right chest wall and involving the right chest wall musculature as above. 3. No mediastinal or hilar mass or adenopathy. 4. New small right pleural effusion with overlying atelectasis. No definite metastatic pulmonary nodules. Electronically Signed   By: Marijo Sanes M.D.   On: 05/07/2019 07:55   MR ABDOMEN MRCP W WO CONTAST  Result Date: 05/07/2019 CLINICAL DATA:  Acute cholangitis. Mild biliary ductal dilatation. Leukocytosis. Melanoma and right breast carcinoma. EXAM: MRI ABDOMEN WITHOUT AND WITH CONTRAST (INCLUDING MRCP) TECHNIQUE: Multiplanar multisequence MR imaging of the abdomen was performed both before and after the administration of intravenous contrast. Heavily T2-weighted images  of the biliary and pancreatic ducts were obtained, and three-dimensional MRCP images were rendered by post processing.  CONTRAST:  5.74mL GADAVIST GADOBUTROL 1 MMOL/ML IV SOLN COMPARISON:  Ultrasound on 05/06/2019 and CT on 04/28/2019 FINDINGS: Lower chest: Small right pleural effusion. Subcutaneous edema in right chest and abdominal wall. Small fluid collection in right breast, likely representing a postop seroma. Hepatobiliary: No evidence of hepatic mass or abscess. Unremarkable appearance of gallbladder. Common bile duct measures 8 mm in diameter. No evidence of common bile duct wall thickening or enhancement. No evidence of choledocholithiasis or biliary stricture. Pancreas: No mass or inflammatory changes. No evidence of pancreatic ductal dilatation or pancreas divisum. Spleen:  Within normal limits in size and appearance. Adrenals/Urinary Tract: No masses identified. No evidence of hydronephrosis. Stomach/Bowel: Visualized portion unremarkable. Vascular/Lymphatic: No pathologically enlarged lymph nodes identified. No abdominal aortic aneurysm. Other:  None. Musculoskeletal:  No suspicious bone lesions identified. IMPRESSION: 1. No evidence of biliary obstruction or choledocholithiasis. 2. No evidence of hepatic abscess or mass. 3. Small right pleural effusion, and right chest and abdominal wall edema. Probable postop seroma in the right breast. Electronically Signed   By: Marlaine Hind M.D.   On: 05/07/2019 08:05   US Abdomen Limited RUQ  Result Date: 05/06/2019 CLINICAL DATA:  Right upper quadrant abdominal pain. EXAM: ULTRASOUND ABDOMEN LIMITED RIGHT UPPER QUADRANT COMPARISON:  CT 04/28/2019 at [facility]: Gallbladder: Distended. No gallstones or wall thickening visualized. No sonographic Murphy sign noted by sonographer. Common bile duct: Diameter: Dilated measuring 10 mm at the porta hepatis, 8 mm distally. Liver: No focal lesion identified. Within normal limits in parenchymal echogenicity. Portal vein is patent on color Doppler imaging with normal direction of blood flow towards the liver. Other: Right  pleural effusion, not seen on recent CT. IMPRESSION: 1. Biliary dilatation with common bile duct measuring 10 mm. This is new from CT 1 week ago. Recommend further evaluation with MRCP or ERCP. 2. Gallbladder is mildly distended, but otherwise normal. No gallstones. 3. Right pleural effusion incidentally noted, new from prior CT. Electronically Signed   By: Keith Rake M.D.   On: 05/06/2019 20:50   Assessment and Plan:  1.  Leukocytosis 2.  Right axillary nodal recurrence of previous stage Ia (T1 a N0 M0) melanoma 3.  Elevated LFTs and total bilirubin 4.  Hypercalcemia 5.  Hypertension 6.  Hyperlipidemia  -WBC is elevated but is trending downward.  Etiology not entirely clear ?infection.  The patient is afebrile.  Previous surgical incision without obvious signs of infection.  Cultures obtained and are pending.  Agree with IV antibiotics antibiotics.  -Discussed case with her primary oncologist in [place], [place].  His plan is still to begin immunotherapy as an outpatient.  No inpatient treatment is recommended. -LFTs are slowly trending downward although total bili remains elevated.  Recommend continued monitoring. -The patient has hypercalcemia due to underlying malignancy.  Continue IV fluids.  Recommend Zometa 4 mg IV x1 dose.  Will defer to hospitalist for further work-up including PTH and PTH-RP.  Thank you for this referral.   Mikey Bussing, DNP, AGPCNP-BC, AOCNP  Addendum  I have seen the patient, examined her. I agree with the assessment and and plan and have edited the notes.   I reviewed her outside medical records, her CT images in person. I spoke with her primary oncologist Dr. Bobby Rumpf. She unfortunately has rapid melanoma recurrence at right axilla 1 month after x-ray lymph node dissection.  The tumor invades right-sided  chest wall, not resectable.  She needs to start systemic therapy as soon as possible.  I agree with Dr. Jaclyn Shaggy recommendation with immunotherapy  Keytruda.  However immunotherapy will not shrink her tumor in a few days, more likely in a few to several weeks. So urgent inpt treatment is not indicated. I am not sure what the causes her leukocytosis and abnormal LFTs, no imaging evidence of liver metastasis.  I agree with empiric antibiotics, cultures are pending.  Her hypercalcemia is likely related to her underlying malignancy, I recommend biphosphonate Zometa 4 mg once, in addition to IV fluids.  We will follow up on Monday, please call us if needed.   Truitt Merle  05/07/2019

## 2019-05-07 NOTE — Consult Note (Signed)
[facility] Surgery Consult Note  Angelica Dunn 15-Jan-1971  [id number].    Requesting MD: Owens Loffler Chief Complaint/Reason for Consult: elevated LFTs  HPI:  Angelica Dunn is a 49yo female PMH melanoma diagnosed in 2019 after a skin biopsy on her right flank in a dermatologist's office. She was found to have recurrence of melanoma in 2020. She most recently underwent right axillary lymph node dissection, right needle localized lumpectomy, and left port placement 03/31/2019 by Dr. Noberto Retort. Pathology showed that she had 7 our of 19 lymph nodes positive for metastatic melanoma; breast lesion was a complex sclerosing lesion. Her oncologist is Dr. Bobby Rumpf at [facility]. She was seen in their office 04/28/2019 with plans to start immunotherapy, but her LFTs were found to be elevated so chemotherapy was never started. She was referred to gastroenterology for further work up whom she saw in the office yesterday. WBC found to be 40K therefore she was sent to the ED. She was admitted and started on broad spectrum antibiotics. Patient reports general malaise and right axillary/arm pain since her procedure last month. The incisions are not erythematous or draining. She reports anorexia and intermittent nausea, but denies abdominal pain (specifically no RUQ/epigastric pain), emesis, diarrhea, fever, chills, weight loss.   MRCP shows no evidence of biliary obstruction or choledocholithiasis, no evidence of hepatic abscess or mass.  CTA chest shows no PE, extensive/ aggressive and progressive infiltrating tumor in the right axilla invading the right chest wall and involving the right chest wall musculature, new small right pleural effusion with overlying atelectasis, no definite metastatic pulmonary nodules.  Yesterday AST 486, ALT 741, Alk phos 225, Tbili 2.0. INR 1.3.  Blood cultures NGTD. Several labs pending to evaluate for possible viral hepatitis etiology of elevated  LFTs.   Patient was scheduled for ERCP today with Dr. Ardis Hughs who requested surgical evaluation prior to this procedure.  Review of Systems  Constitutional: Positive for malaise/fatigue. Negative for chills and fever.  HENT: Negative.   Eyes: Negative.   Respiratory: Negative.   Cardiovascular: Negative.   Gastrointestinal: Positive for nausea. Negative for abdominal pain and vomiting.  Genitourinary: Negative.   Skin:       Right axillary/arm pain   All systems reviewed and otherwise negative except for as above  Family History  Problem Relation Age of Onset  . Breast cancer Maternal Aunt   . Breast cancer Cousin     Past Medical History:  Diagnosis Date  . No pertinent past medical history     Past Surgical History:  Procedure Laterality Date  . BLADDER SUSPENSION  10/09/2011   Procedure: TRANSVAGINAL TAPE (TVT) PROCEDURE;  Surgeon: Daria Pastures, MD;  Location: [facility];  Service: Gynecology;  Laterality: N/A;  . BREAST EXCISIONAL BIOPSY Right   . CYSTOCELE REPAIR  10/09/2011   Procedure: ANTERIOR REPAIR (CYSTOCELE);  Surgeon: Daria Pastures, MD;  Location: [facility];  Service: Gynecology;  Laterality: N/A;  . CYSTOSCOPY  10/09/2011   Procedure: CYSTOSCOPY;  Surgeon: Daria Pastures, MD;  Location: [facility];  Service: Gynecology;  Laterality: N/A;  . VAGINAL HYSTERECTOMY  10/09/2011   Procedure: HYSTERECTOMY VAGINAL;  Surgeon: Daria Pastures, MD;  Location: [facility];  Service: Gynecology;  Laterality: N/A;    Social History:  reports that she has quit smoking. She has never used smokeless tobacco. She reports previous alcohol use. She reports that she does not use drugs.  Allergies:  Allergies  Allergen Reactions  . Gabapentin Other (  See Comments)    "Made me feel odd, so I stopped taking it"    Medications Prior to Admission  Medication Sig Dispense Refill  . acetaminophen (TYLENOL) 325 MG tablet Take 325-650 mg by mouth every 6 (six) hours as needed for mild  pain or headache.    Marland Kitchen amitriptyline (ELAVIL) 10 MG tablet Take 10-20 mg by mouth at bedtime as needed for sleep.     . fexofenadine (ALLEGRA) 180 MG tablet Take 180 mg by mouth daily as needed for rhinitis (or seasonal allergies).    Marland Kitchen lisinopril (ZESTRIL) 20 MG tablet Take 20 mg by mouth at bedtime.     . magnesium hydroxide (MILK OF MAGNESIA) 400 MG/5ML suspension Take 15-30 mLs by mouth 2 (two) times daily as needed for mild constipation or moderate constipation.     . ondansetron (ZOFRAN-ODT) 4 MG disintegrating tablet Take 4 mg by mouth every 8 (eight) hours as needed for nausea or vomiting (DISSOLVE ORALLY).    Marland Kitchen oxyCODONE (OXY IR/ROXICODONE) 5 MG immediate release tablet Take 5 mg by mouth 3 (three) times daily as needed for moderate pain or severe pain.    . traMADol (ULTRAM) 50 MG tablet Take 50 mg by mouth every 6 (six) hours as needed (for pain).     . triamcinolone (NASACORT ALLERGY 24HR) 55 MCG/ACT AERO nasal inhaler Place 2 sprays into the nose daily as needed (for seasonal allergies).    . ezetimibe (ZETIA) 10 MG tablet Take 10 mg by mouth daily.      Prior to Admission medications   Medication Sig Start Date End Date Taking? Authorizing Provider  acetaminophen (TYLENOL) 325 MG tablet Take 325-650 mg by mouth every 6 (six) hours as needed for mild pain or headache.   Yes [provider]  amitriptyline (ELAVIL) 10 MG tablet Take 10-20 mg by mouth at bedtime as needed for sleep.    Yes [provider]  fexofenadine (ALLEGRA) 180 MG tablet Take 180 mg by mouth daily as needed for rhinitis (or seasonal allergies).   Yes [provider]  lisinopril (ZESTRIL) 20 MG tablet Take 20 mg by mouth at bedtime.    Yes [provider]  magnesium hydroxide (MILK OF MAGNESIA) 400 MG/5ML suspension Take 15-30 mLs by mouth 2 (two) times daily as needed for mild constipation or moderate constipation.    Yes [provider]  ondansetron (ZOFRAN-ODT) 4 MG  disintegrating tablet Take 4 mg by mouth every 8 (eight) hours as needed for nausea or vomiting (DISSOLVE ORALLY).   Yes [provider]  oxyCODONE (OXY IR/ROXICODONE) 5 MG immediate release tablet Take 5 mg by mouth 3 (three) times daily as needed for moderate pain or severe pain.   Yes [provider]  traMADol (ULTRAM) 50 MG tablet Take 50 mg by mouth every 6 (six) hours as needed (for pain).    Yes [provider]  triamcinolone (NASACORT ALLERGY 24HR) 55 MCG/ACT AERO nasal inhaler Place 2 sprays into the nose daily as needed (for seasonal allergies).   Yes [provider]  ezetimibe (ZETIA) 10 MG tablet Take 10 mg by mouth daily.    [provider]    Blood pressure 116/79, pulse 84, temperature 98.5 F (36.9 C), temperature source Oral, resp. rate 18, height 5' 3"  (1.6 m), weight 57.1 kg, last menstrual period 09/29/2011, SpO2 99 %. Physical Exam: General: pleasant, WD/WN white female who is laying in bed in NAD HEENT: head is normocephalic, atraumatic.  Sclera are  noninjected.  PERRL.  Ears and nose without any masses or lesions.  Mouth is pink and moist. Dentition fair Heart: regular, rate, and rhythm.  Normal s1,s2. No obvious murmurs, gallops, or rubs noted.  Palpable pedal pulses bilaterally  Lungs: CTAB, no wheezes, rhonchi, or rales noted.  Respiratory effort nonlabored.  Chest: Port in place left chest without erythema or drainage. Right axillary incision cdi with some ecchymosis but no erythema or drainage. Large fluctuant area noted distal to right scapula without overlying skin changes, no erythema or induration, nontender to palpation Abd: soft, ND, +BS, no masses, hernias, or organomegaly. nontender (specifically no RUQ/epigastric TTP) Skin: warm and dry with no masses, lesions, or rashes Psych: A&Ox4 with an appropriate affect Neuro: cranial nerves grossly intact, equal strength in BUE/BLE bilaterally, normal speech, thought process  intact MS: no BLE edema, calves soft and nontender.   Results for orders placed or performed during the hospital encounter of 05/06/19 (from the past 48 hour(s))  TSH     Status: None   Collection Time: 05/06/19  7:29 PM  Result Value Ref Range   TSH 3.450 0.350 - 4.500 uIU/mL    Comment: Performed by a 3rd Generation assay with a functional sensitivity of <=0.01 uIU/mL. Performed at [facility], [place] [address], [place], [place] [address]   T4, free     Status: Abnormal   Collection Time: 05/06/19  7:29 PM  Result Value Ref Range   Free T4 1.79 (H) 0.61 - 1.12 ng/dL    Comment: (NOTE) Biotin ingestion may interfere with free T4 tests. If the results are inconsistent with the TSH level, previous test results, or the clinical presentation, then consider biotin interference. If needed, order repeat testing after stopping biotin. Performed at [facility], [place] [address] [place], [place] [address]   Troponin I (High Sensitivity)     Status: Abnormal   Collection Time: 05/06/19  7:29 PM  Result Value Ref Range   Troponin I (High Sensitivity) 18 (H) <18 ng/L    Comment: (NOTE) Elevated high sensitivity troponin I (hsTnI) values and significant  changes across serial measurements may suggest ACS but many other  chronic and acute conditions are known to elevate hsTnI results.  Refer to the "Links" section for chest pain algorithms and additional  guidance. Performed at [facility], [place] [address] [place], [place] [address]   Blood culture (routine x 2)     Status: None (Preliminary result)   Collection Time: 05/06/19  9:42 PM   Specimen: BLOOD  Result Value Ref Range   Specimen Description BLOOD LEFT ARM    Special Requests      BOTTLES DRAWN AEROBIC AND ANAEROBIC Blood Culture results may not be optimal due to an excessive volume of blood received in culture bottles   Culture      NO GROWTH < 12 HOURS Performed at [facility] [address]  [place], [place] [address]    Report Status PENDING   Respiratory Panel by RT PCR (Flu A&B, Covid) - Nasopharyngeal Swab     Status: None   Collection Time: 05/06/19 10:35 PM   Specimen: Nasopharyngeal Swab  Result Value Ref Range   SARS Coronavirus 2 by RT PCR NEGATIVE NEGATIVE    Comment: (NOTE) SARS-CoV-2 target nucleic acids are NOT DETECTED. The SARS-CoV-2 RNA is generally detectable in upper respiratoy specimens during the acute phase of infection. The lowest concentration of SARS-CoV-2 viral copies this assay  can detect is 131 copies/mL. A negative result does not preclude SARS-Cov-2 infection and should not be used as the sole basis for treatment or other patient management decisions. A negative result may occur with  improper specimen collection/handling, submission of specimen other than nasopharyngeal swab, presence of viral mutation(s) within the areas targeted by this assay, and inadequate number of viral copies (<131 copies/mL). A negative result must be combined with clinical observations, patient history, and epidemiological information. The expected result is Negative. Fact Sheet for Patients:  PinkCheek.be Fact Sheet for Healthcare Providers:  GravelBags.it This test is not yet ap proved or cleared by the [place] FDA and  has been authorized for detection and/or diagnosis of SARS-CoV-2 by FDA under an Emergency Use Authorization (EUA). This EUA will remain  in effect (meaning this test can be used) for the duration of the COVID-19 declaration under Section 564(b)(1) of the Act, 21 U.S.C. section 360bbb-3(b)(1), unless the authorization is terminated or revoked sooner.    Influenza A by PCR NEGATIVE NEGATIVE   Influenza B by PCR NEGATIVE NEGATIVE    Comment: (NOTE) The Xpert Xpress SARS-CoV-2/FLU/RSV assay is intended as an aid in  the diagnosis of influenza from Nasopharyngeal swab specimens and  should  not be used as a sole basis for treatment. Nasal washings and  aspirates are unacceptable for Xpert Xpress SARS-CoV-2/FLU/RSV  testing. Fact Sheet for Patients: PinkCheek.be Fact Sheet for Healthcare Providers: GravelBags.it This test is not yet approved or cleared by the [place] FDA and  has been authorized for detection and/or diagnosis of SARS-CoV-2 by  FDA under an Emergency Use Authorization (EUA). This EUA will remain  in effect (meaning this test can be used) for the duration of the  Covid-19 declaration under Section 564(b)(1) of the Act, 21  U.S.C. section 360bbb-3(b)(1), unless the authorization is  terminated or revoked. Performed at [facility], [place] [address], [place], [place] [address]   Urinalysis, Complete w Microscopic     Status: Abnormal   Collection Time: 05/07/19  1:22 AM  Result Value Ref Range   Color, Urine STRAW (A) YELLOW   APPearance CLEAR CLEAR   Specific Gravity, Urine 1.009 1.005 - 1.030   pH 6.0 5.0 - 8.0   Glucose, UA NEGATIVE NEGATIVE mg/dL   Hgb urine dipstick NEGATIVE NEGATIVE   Bilirubin Urine NEGATIVE NEGATIVE   Ketones, ur NEGATIVE NEGATIVE mg/dL   Protein, ur NEGATIVE NEGATIVE mg/dL   Nitrite NEGATIVE NEGATIVE   Leukocytes,Ua NEGATIVE NEGATIVE   WBC, UA 0-5 0 - 5 WBC/hpf   Bacteria, UA NONE SEEN NONE SEEN   Squamous Epithelial / LPF 0-5 0 - 5    Comment: Performed at [facility] [address] [place], [place] [address]  Magnesium     Status: None   Collection Time: 05/07/19  1:48 AM  Result Value Ref Range   Magnesium 2.1 1.7 - 2.4 mg/dL    Comment: Performed at [facility] [address], [place], [place] [address]  CBC with Differential/Platelet     Status: Abnormal   Collection Time: 05/07/19  1:48 AM  Result Value Ref Range   WBC 34.9 (H) 4.0 - 10.5 K/uL   RBC 4.51 3.87 - 5.11 MIL/uL   Hemoglobin 13.2 12.0 - 15.0 g/dL   HCT 41.7 36.0 - 46.0 %    MCV 92.5 80.0 - 100.0 fL   MCH 29.3 26.0 - 34.0 pg   MCHC 31.7 30.0 - 36.0  g/dL   RDW 13.2 11.5 - 15.5 %   Platelets 460 (H) 150 - 400 K/uL   nRBC 0.0 0.0 - 0.2 %   Neutrophils Relative % 85 %   Neutro Abs 29.8 (H) 1.7 - 7.7 K/uL   Lymphocytes Relative 5 %   Lymphs Abs 1.7 0.7 - 4.0 K/uL   Monocytes Relative 9 %   Monocytes Absolute 3.0 (H) 0.1 - 1.0 K/uL   Eosinophils Relative 0 %   Eosinophils Absolute 0.0 0.0 - 0.5 K/uL   Basophils Relative 0 %   Basophils Absolute 0.1 0.0 - 0.1 K/uL   Immature Granulocytes 1 %   Abs Immature Granulocytes 0.31 (H) 0.00 - 0.07 K/uL    Comment: Performed at [facility] [address] [place], [place] [address]  HIV Antibody (routine testing w rflx)     Status: None   Collection Time: 05/07/19  1:48 AM  Result Value Ref Range   HIV Screen 4th Generation wRfx NON REACTIVE NON REACTIVE    Comment: Performed at [facility] [address], [place], [place] [address]  TSH     Status: None   Collection Time: 05/07/19  1:48 AM  Result Value Ref Range   TSH 3.746 0.350 - 4.500 uIU/mL    Comment: Performed by a 3rd Generation assay with a functional sensitivity of <=0.01 uIU/mL. Performed at [facility], [place] [address], [place], [place] [address]   Procalcitonin - Baseline     Status: None   Collection Time: 05/07/19  1:48 AM  Result Value Ref Range   Procalcitonin 1.50 ng/mL    Comment:        Interpretation: PCT > 0.5 ng/mL and <= 2 ng/mL: Systemic infection (sepsis) is possible, but other conditions are known to elevate PCT as well. (NOTE)       Sepsis PCT Algorithm           Lower Respiratory Tract                                      Infection PCT Algorithm    ----------------------------     ----------------------------         PCT < 0.25 ng/mL                PCT < 0.10 ng/mL         Strongly encourage             Strongly discourage   discontinuation of antibiotics    initiation of antibiotics     ----------------------------     -----------------------------       PCT 0.25 - 0.50 ng/mL            PCT 0.10 - 0.25 ng/mL               OR       >80% decrease in PCT            Discourage initiation of                                            antibiotics      Encourage discontinuation           of antibiotics    ----------------------------     -----------------------------  PCT >= 0.50 ng/mL              PCT 0.26 - 0.50 ng/mL                AND       <80% decrease in PCT             Encourage initiation of                                             antibiotics       Encourage continuation           of antibiotics    ----------------------------     -----------------------------        PCT >= 0.50 ng/mL                  PCT > 0.50 ng/mL               AND         increase in PCT                  Strongly encourage                                      initiation of antibiotics    Strongly encourage escalation           of antibiotics                                     -----------------------------                                           PCT <= 0.25 ng/mL                                                 OR                                        > 80% decrease in PCT                                     Discontinue / Do not initiate                                             antibiotics Performed at [facility], [address], [place], [place] [address]   Lipase, blood     Status: None   Collection Time: 05/07/19  1:48 AM  Result Value Ref Range   Lipase 16 11 - 51 U/L    Comment: Performed at [facility] [address], [place], [place] [address]  Renal function panel  Status: Abnormal   Collection Time: 05/07/19  1:48 AM  Result Value Ref Range   Sodium 141 135 - 145 mmol/L   Potassium 3.7 3.5 - 5.1 mmol/L   Chloride 108 98 - 111 mmol/L   CO2 20 (L) 22 - 32 mmol/L   Glucose, Bld 79 70 - 99 mg/dL    Comment: Glucose reference range applies only  to samples taken after fasting for at least 8 hours.   BUN 24 (H) 6 - 20 mg/dL   Creatinine, Ser 1.45 (H) 0.44 - 1.00 mg/dL   Calcium 12.5 (H) 8.9 - 10.3 mg/dL   Phosphorus 4.8 (H) 2.5 - 4.6 mg/dL   Albumin 2.4 (L) 3.5 - 5.0 g/dL   GFR calc non Af Amer 42 (L) >60 mL/min   GFR calc Af Amer 49 (L) >60 mL/min   Anion gap 13 5 - 15    Comment: Performed at [facility] [address], [place], [place] [address]   CT Angio Chest PE W and/or Wo Contrast  Result Date: 05/07/2019 CLINICAL DATA:  History of melanoma. Axillary biopsy 5 weeks ago. Pain, swelling and inability to raise the right arm. No chest pain or shortness of breath. EXAM: CT ANGIOGRAPHY CHEST WITH CONTRAST TECHNIQUE: Multidetector CT imaging of the chest was performed using the standard protocol during bolus administration of intravenous contrast. Multiplanar CT image reconstructions and MIPs were obtained to evaluate the vascular anatomy. CONTRAST:  60 cc OMNIPAQUE IOHEXOL 350 MG/ML SOLN COMPARISON:  Chest CT 04/28/2019 FINDINGS: Cardiovascular: The heart is normal in size. No pericardial effusion. The aorta is normal in caliber. No dissection. The branch vessels are patent. No coronary artery calcifications. The pulmonary arterial tree is well opacified. No filling defects to suggest pulmonary embolism. Mediastinum/Nodes: Small scattered mediastinal and hilar lymph nodes are stable. No mass or overt adenopathy. The esophagus is grossly normal. The thyroid gland is normal. Lungs/Pleura: New small right pleural effusion with overlying atelectasis. No focal infiltrates or worrisome pulmonary nodules. Upper Abdomen: No significant upper abdominal findings. Chest wall/musculoskeletal: Extensive and progressive infiltrating tumor in the right axilla and invading the right chest wall. This measures a maximum of 10.2 x 6.2 cm on image 47/6. This previously measured 8.1 x 3.4 cm. Tumor is now bulging the pleura. Do not see any definite direct  bony invasion or destruction. Suspect direct invasion of the latissimus dorsi, serratus anterior and subscapularis muscles. Review of the MIP images confirms the above findings. IMPRESSION: 1. No CT findings for pulmonary embolism. 2. Extensive, aggressive and progressive infiltrating tumor in the right axilla. This is invading the right chest wall and involving the right chest wall musculature as above. 3. No mediastinal or hilar mass or adenopathy. 4. New small right pleural effusion with overlying atelectasis. No definite metastatic pulmonary nodules. Electronically Signed   By: Marijo Sanes M.D.   On: 05/07/2019 07:55   MR ABDOMEN MRCP W WO CONTAST  Result Date: 05/07/2019 CLINICAL DATA:  Acute cholangitis. Mild biliary ductal dilatation. Leukocytosis. Melanoma and right breast carcinoma. EXAM: MRI ABDOMEN WITHOUT AND WITH CONTRAST (INCLUDING MRCP) TECHNIQUE: Multiplanar multisequence MR imaging of the abdomen was performed both before and after the administration of intravenous contrast. Heavily T2-weighted images of the biliary and pancreatic ducts were obtained, and three-dimensional MRCP images were rendered by post processing. CONTRAST:  5.78m GADAVIST GADOBUTROL 1 MMOL/ML IV SOLN COMPARISON:  Ultrasound on 05/06/2019 and CT on 04/28/2019 FINDINGS: Lower chest: Small right pleural effusion. Subcutaneous edema in  right chest and abdominal wall. Small fluid collection in right breast, likely representing a postop seroma. Hepatobiliary: No evidence of hepatic mass or abscess. Unremarkable appearance of gallbladder. Common bile duct measures 8 mm in diameter. No evidence of common bile duct wall thickening or enhancement. No evidence of choledocholithiasis or biliary stricture. Pancreas: No mass or inflammatory changes. No evidence of pancreatic ductal dilatation or pancreas divisum. Spleen:  Within normal limits in size and appearance. Adrenals/Urinary Tract: No masses identified. No evidence of  hydronephrosis. Stomach/Bowel: Visualized portion unremarkable. Vascular/Lymphatic: No pathologically enlarged lymph nodes identified. No abdominal aortic aneurysm. Other:  None. Musculoskeletal:  No suspicious bone lesions identified. IMPRESSION: 1. No evidence of biliary obstruction or choledocholithiasis. 2. No evidence of hepatic abscess or mass. 3. Small right pleural effusion, and right chest and abdominal wall edema. Probable postop seroma in the right breast. Electronically Signed   By: Marlaine Hind M.D.   On: 05/07/2019 08:05   US Abdomen Limited RUQ  Result Date: 05/06/2019 CLINICAL DATA:  Right upper quadrant abdominal pain. EXAM: ULTRASOUND ABDOMEN LIMITED RIGHT UPPER QUADRANT COMPARISON:  CT 04/28/2019 at [facility]: Gallbladder: Distended. No gallstones or wall thickening visualized. No sonographic Murphy sign noted by sonographer. Common bile duct: Diameter: Dilated measuring 10 mm at the porta hepatis, 8 mm distally. Liver: No focal lesion identified. Within normal limits in parenchymal echogenicity. Portal vein is patent on color Doppler imaging with normal direction of blood flow towards the liver. Other: Right pleural effusion, not seen on recent CT. IMPRESSION: 1. Biliary dilatation with common bile duct measuring 10 mm. This is new from CT 1 week ago. Recommend further evaluation with MRCP or ERCP. 2. Gallbladder is mildly distended, but otherwise normal. No gallstones. 3. Right pleural effusion incidentally noted, new from prior CT. Electronically Signed   By: Keith Rake M.D.   On: 05/06/2019 20:50    Anti-infectives (From admission, onward)   Start     Dose/Rate Route Frequency Ordered Stop   05/07/19 2000  vancomycin (VANCOREADY) IVPB 750 mg/150 mL     750 mg 150 mL/hr over 60 Minutes Intravenous Every 24 hours 05/07/19 0410     05/07/19 0600  piperacillin-tazobactam (ZOSYN) IVPB 3.375 g     3.375 g 12.5 mL/hr over 240 Minutes Intravenous Every 8 hours  05/07/19 0410     05/06/19 2145  piperacillin-tazobactam (ZOSYN) IVPB 3.375 g     3.375 g 100 mL/hr over 30 Minutes Intravenous  Once 05/06/19 2144 05/06/19 2232   05/06/19 2145  vancomycin (VANCOREADY) IVPB 1250 mg/250 mL     1,250 mg 166.7 mL/hr over 90 Minutes Intravenous  Once 05/06/19 2144 05/07/19 0004       Assessment/Plan  Recurrent melanoma S/p right axillary lymph node dissection, right needle localized lumpectomy, and left port placement 03/31/2019 by Dr. Noberto Retort - Pathology showed that she had 7 our of 19 lymph nodes positive for metastatic melanoma; breast lesion was a complex sclerosing lesion - oncologist Dr. Bobby Rumpf in [place], port in place but has not started immunotherapy due to elevated LFTs Malaise, anorexia, nausea Leukocytosis - Blood cx NGTD, on broad spectrum abx Elevated LFTs - MRCP shows no evidence of biliary obstruction or choledocholithiasis, no evidence of hepatic abscess or mass - Patient has no abdominal pain or symptoms highly suspicious for biliary source of her elevated LFTs. She has a significant leukocytosis but is afebrile and vital signs are stable. Plan was discussed between Dr. Ardis Hughs and Dr. Dema Severin. Will hold on  ERCP for now. More likely may have a possible viral hepatitis. Agree with continuing broad spectrum antibiotics. Follow up on extensive pending lab work.   ID - zosyn/vancomycin 3/11>> VTE - SCDs, per primary/ok for chemical DVT prophylaxis from surgical standpoint FEN - reg diet Foley - none Follow up - TBD  Wellington Hampshire, [facility] 05/07/2019, 10:40 AM Please see Amion for pager number during day hours 7:00am-4:30pm

## 2019-05-08 DIAGNOSIS — R945 Abnormal results of liver function studies: Secondary | ICD-10-CM

## 2019-05-08 DIAGNOSIS — K719 Toxic liver disease, unspecified: Secondary | ICD-10-CM

## 2019-05-08 LAB — MAGNESIUM: Magnesium: 1.7 mg/dL (ref 1.7–2.4)

## 2019-05-08 LAB — CBC WITH DIFFERENTIAL/PLATELET
Abs Immature Granulocytes: 0 10*3/uL (ref 0.00–0.07)
Basophils Absolute: 0 10*3/uL (ref 0.0–0.1)
Basophils Relative: 0 %
Eosinophils Absolute: 0 10*3/uL (ref 0.0–0.5)
Eosinophils Relative: 0 %
HCT: 36.5 % (ref 36.0–46.0)
Hemoglobin: 11.7 g/dL — ABNORMAL LOW (ref 12.0–15.0)
Lymphocytes Relative: 1 %
Lymphs Abs: 0.3 10*3/uL — ABNORMAL LOW (ref 0.7–4.0)
MCH: 29.8 pg (ref 26.0–34.0)
MCHC: 32.1 g/dL (ref 30.0–36.0)
MCV: 93.1 fL (ref 80.0–100.0)
Monocytes Absolute: 1.2 10*3/uL — ABNORMAL HIGH (ref 0.1–1.0)
Monocytes Relative: 4 %
Neutro Abs: 29 10*3/uL — ABNORMAL HIGH (ref 1.7–7.7)
Neutrophils Relative %: 95 %
Platelets: 358 10*3/uL (ref 150–400)
RBC: 3.92 MIL/uL (ref 3.87–5.11)
RDW: 13.6 % (ref 11.5–15.5)
WBC: 30.5 10*3/uL — ABNORMAL HIGH (ref 4.0–10.5)
nRBC: 0 % (ref 0.0–0.2)
nRBC: 0 /100 WBC

## 2019-05-08 LAB — COMPREHENSIVE METABOLIC PANEL
ALT: 261 U/L — ABNORMAL HIGH (ref 0–44)
AST: 87 U/L — ABNORMAL HIGH (ref 15–41)
Albumin: 1.8 g/dL — ABNORMAL LOW (ref 3.5–5.0)
Alkaline Phosphatase: 150 U/L — ABNORMAL HIGH (ref 38–126)
Anion gap: 7 (ref 5–15)
BUN: 18 mg/dL (ref 6–20)
CO2: 22 mmol/L (ref 22–32)
Calcium: 11.8 mg/dL — ABNORMAL HIGH (ref 8.9–10.3)
Chloride: 112 mmol/L — ABNORMAL HIGH (ref 98–111)
Creatinine, Ser: 1.42 mg/dL — ABNORMAL HIGH (ref 0.44–1.00)
GFR calc Af Amer: 50 mL/min — ABNORMAL LOW (ref >60)
GFR calc non Af Amer: 44 mL/min — ABNORMAL LOW (ref >60)
Glucose, Bld: 102 mg/dL — ABNORMAL HIGH (ref 70–99)
Potassium: 3 mmol/L — ABNORMAL LOW (ref 3.5–5.1)
Sodium: 141 mmol/L (ref 135–145)
Total Bilirubin: 1.5 mg/dL — ABNORMAL HIGH (ref 0.3–1.2)
Total Protein: 4.8 g/dL — ABNORMAL LOW (ref 6.5–8.1)

## 2019-05-08 LAB — PHOSPHORUS: Phosphorus: 3 mg/dL (ref 2.5–4.6)

## 2019-05-08 LAB — MRSA PCR SCREENING: MRSA by PCR: NEGATIVE

## 2019-05-08 LAB — HSV(HERPES SIMPLEX VRS) I + II AB-IGM: HSVI/II Comb IgM: <0.91 ratio (ref 0.00–0.90)

## 2019-05-08 LAB — URINE CULTURE: Culture: NO GROWTH

## 2019-05-08 LAB — CMV IGM: CMV IgM: <30 AU/mL (ref 0.0–29.9)

## 2019-05-08 MED ORDER — MAGNESIUM SULFATE IN D5W 1-5 GM/100ML-% IV SOLN
1.0000 g | Freq: Once | INTRAVENOUS | Status: AC
  Administered 2019-05-08: 1 g via INTRAVENOUS
  Filled 2019-05-08: qty 100

## 2019-05-08 MED ORDER — POTASSIUM CHLORIDE CRYS ER 20 MEQ PO TBCR
40.0000 meq | EXTENDED_RELEASE_TABLET | Freq: Two times a day (BID) | ORAL | Status: AC
  Administered 2019-05-08 (×2): 40 meq via ORAL
  Filled 2019-05-08 (×2): qty 2

## 2019-05-08 MED ORDER — FENTANYL CITRATE (PF) 100 MCG/2ML IJ SOLN
12.5000 ug | INTRAMUSCULAR | Status: DC | PRN
  Administered 2019-05-08 – 2019-05-13 (×13): 12.5 ug via INTRAVENOUS
  Filled 2019-05-08 (×13): qty 2

## 2019-05-08 NOTE — Progress Notes (Signed)
PROGRESS NOTE    Angelica Dunn  EGB:[id number] DOB: [birth year] DOA: 05/06/2019 PCP: Renaldo Reel, PA    Brief Narrative:  HPI per Dr. Dana Allan on 05/06/19 Patient is a 49 year old lady with history of recurrent melanoma with lymph node involvement.  Patient underwent right axillary lymph node dissection on March 31, 2019, as well as, placement of port for possible immunotherapy.  Postop.  Has been complicated by significantly elevated liver enzymes.  Patient was referred to the GI team by the PCP, patient was seen earlier today.  Liver enzymes remain significantly elevated.  Additionally, patient has significant leukocytosis, with WBC of 39,000.  Patient denies fever, chills, headache, neck pain, URI symptoms, chest pain, shortness of breath, GI symptoms or urinary symptoms.  Abdominal ultrasound revealed biliary dilatation with common bile duct measuring 10 mm.  Gallbladder was said to be mildly distended, but otherwise normal.  No gallstones.  Right-sided pleural effusion was incidentally noted, which is said to be new.  Patient will be admitted for further assessment and management.  ED Course: On presentation to the hospital, vitals revealed temperature of 98.5, heart rate of 140 140 bpm, respiratory rate of 15, blood pressure of 110/74 mmHg and O2 sat of 99%.  Patient has been pancultured by the ER providers.  Patient will be started IV vancomycin and Zosyn.  Pertinent labs: CBC reveals WBC of 39, hemoglobin of 14.7, hematocrit of 44.3 with platelet count of 500.  Test revealed ALT of 741, AST of 486, alkaline phosphatase of 225, albumin of 3.2, direct bilirubin of 1.5 with total bilirubin of 2 and total protein of 7.2.  Imaging: independently reviewed.   Review of Systems:  Negative for fever, visual changes, sore throat, rash, new muscle aches, chest pain, SOB, dysuria, bleeding, n/v/abdominal pain.  Patient has right axillary discomfort/pain following lymph node  dissection on March 30, 2019.  **Interim History Patient was still complaining of some right axillary pain that was her main complaint today and states that her arm felt "heavy".  Happy to learn that her LFTs are trending down along with her bilirubin.  Will start on IV fluids and diet given that no ERCP will be done .  General Surgery recommends continuing IV antibiotics at this time empirically.  Gastroenterology is now signed off as they felt that her elevated LFTs were secondary to drug-induced liver injury  Assessment & Plan:   Active Problems:   Leukocytosis   Melanoma of skin (HCC)  Leukocytosis with SIRS-like picture, present on admission, slowly trending down -Admitted patient to inpatient MedSurg -Patient was tachycardic and has a leukocytosis but currently has no source of infection -WBC was close to 40,000 on admission is now trending downwards and WBC is now 30,500 -Panculture patient and obtain blood cultures, urine culture, urinalysis as well as a respiratory virus panel and mono screen -MRI of the abdomen as below -Broad-spectrum antibiotics changed to IV Vancomycin/IV Cefepime now -General surgery recommending just IV antibiotics -Started the patient on normal saline at 75 mils per hour and will continue; 1 L of normal saline in the ED was given 1 L normal saline bolus yesterday -Follow cultures -Procalcitonin level is 1.50 -Repeat CBC -Obtain documentation from patient's oncologist, Dr. Bobby Rumpf, based in [place], [facility]). -Further management depend on hospital course.  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 -She has had 7 out of 19 lymph nodes positive for metastatic melanoma  and breast lesion was complex sclerosing lesion -He has a port in place but has not started immunotherapy due to her abnormalities -Immunotherapy is currently on hold due to  abnormal liver function tests and will be starting outpatient -GI team is managing abnormal LFTs and recommends continue to monitor for now and no ERCP. -Patient is known to an oncologist based [place], [place] have consulted medical oncology for further evaluation recommendations and they feel that no inpatient treatment is recommended at this time -CT of the chest PE was done and showed "No CT findings for pulmonary embolism. Extensive, aggressive and progressive infiltrating tumor in the right axilla. This is invading the right chest wall and involving the right chest wall musculature as above. No mediastinal or hilar mass or adenopathy.  New small right pleural effusion with overlying atelectasis. No definite metastatic pulmonary nodules." -Patient will receive Keytruda in outpatient setting -We will continue to monitor carefully and appreciate further oncology recommendations  Right axillary pain secondary to above -We will oxycodone and have added IV fentanyl -Received IV morphine in the ED yesterday -Continue to adjust pain regimen; patient states that her pain is somewhat controlled with the fentanyl but does not last very long.  Will go to every 1 hours  Renal insufficiency and question if this is chronic kidney disease Metabolic acidosis, mild -BUN/creatinine is now 18/1.42 -Started IV fluid hydration as above -Patient's CO2 was 21, chloride level was 109, anion gap was 12 -Avoid nephrotoxic medications, contrast dyes, hypotension and I have stopped her lisinopril and will change her IV Zosyn to IV cefepime -Renally adjust medications -Continue monitor trend liver function panel -Repeat CMP in a.m.  Hypercalcemia, likely of malignancy -Likely malignancy in the setting of melanoma -Started on normal saline at 75 MLS per hour and will continue and calcium is now trending down -Calcium went from 13.0 yesterday and is now 11.8 still pending -Hematology/Oncology consulted  for further evaluation and will check PTHrP and PTH -We will give Zometa 4 mg IV x1  -Continue monitor and trend calcium level -Repeat CMP in a.m.  Thrombocytosis, improved -Trending downwards -Patient's platelet count went from 508,000 -> 431,000 -> 358,000 -Continue to Monitor and Trend -Repeat CBC in AM   Abnormal LFTs Hyperbilirubinemia, now improving finally -Trending down now and went from an AST of 486 down to 87, and an ALT of 741 is now 261; Was higher on 3/4 -Her EBV IgG was 409 and IgM was less than 36.0 and an EBV NA IgG was 51.8 -T bili is now trending down and went from 2.5 is now 1.5 -Alk phos went from 176 and is now 150 -MRI/MRCP of the abdomen showed "No evidence of biliary obstruction or choledocholithiasis. No evidence of hepatic abscess or mass. Small right pleural effusion, and right chest and abdominal wall edema. Probable postop seroma in the right breast." -Mono screen is negative -Alpha-1 antitrypsin serum level was greater than 300 -Ceruloplasmin was 44 -Continue to monitor LFTs and trend -GI recommending no plans for ERCP and recommends continue empiric IV antibiotics and is waiting other viral hepatology's and have added an ANA as well as a CMV IgM.;  GI felt this was felt drug-induced liver injury -Continue to follow Hepatic Fxn Panel and GI recommendations  Hypokalemia but was Hyperkalemic -Patient's potassium this morning was 3.0 -Replete with p.o. potassium chloride 40 mg twice daily x2 doses -Continue monitor and replete as necessary -Repeat CMP in the a.m.  Normocytic Anemia -Patient's Hgb/Hct went  from 13.5/41.6 -> 11.7/36.5 -Check Anemia Panel in the AM -Continue to Monitor for S/Sx of Bleeding; Currently no overt bleeding noted -Repeat CBC in AM   DVT prophylaxis: SCDs Code Status: FULL CODE  Family Communication: No family present at bedside  Disposition Plan: Patient is from home and now has significant leukocytosis and malaise and will  need pending further work-up of her leukocytosis and improvement of LFTs and clearance by GI, general surgery as well as hematology oncology   Consultants:   Gastroenterology  General Surgery  Medical Oncology    Procedures: MRCP   Antimicrobials:  Anti-infectives (From admission, onward)   Start     Dose/Rate Route Frequency Ordered Stop   05/07/19 2200  vancomycin (VANCOREADY) IVPB 750 mg/150 mL     750 mg 150 mL/hr over 60 Minutes Intravenous Every 24 hours 05/07/19 0410     05/07/19 1330  ceFEPIme (MAXIPIME) 2 g in sodium chloride 0.9 % 100 mL IVPB     2 g 200 mL/hr over 30 Minutes Intravenous Every 12 hours 05/07/19 1239     05/07/19 0600  piperacillin-tazobactam (ZOSYN) IVPB 3.375 g  Status:  Discontinued     3.375 g 12.5 mL/hr over 240 Minutes Intravenous Every 8 hours 05/07/19 0410 05/07/19 1238   05/06/19 2145  piperacillin-tazobactam (ZOSYN) IVPB 3.375 g     3.375 g 100 mL/hr over 30 Minutes Intravenous  Once 05/06/19 2144 05/06/19 2232   05/06/19 2145  vancomycin (VANCOREADY) IVPB 1250 mg/250 mL     1,250 mg 166.7 mL/hr over 90 Minutes Intravenous  Once 05/06/19 2144 05/07/19 0004     Subjective: Patient examined at bedside and she again was complaining of some right axillary pain into her chest wall.  No nausea or vomiting.  Happy that her numbers are improving.  No other concerns or complaints at this time but feels that her pain level is a 7 out of 10 and states the pain medication helps but does not last as long.  No other concerns or complaints at this time.  Objective: Vitals:   05/07/19 2108 05/08/19 0430 05/08/19 0835 05/08/19 1345  BP: 134/85 (!) 143/81 120/71 139/87  Pulse: 97 (!) 107 (!) 107 98  Resp: 18 18 18    Temp: 99.7 F (37.6 C) 98.6 F (37 C) 97.6 F (36.4 C) (!) 97.5 F (36.4 C)  TempSrc: Oral Oral Oral Oral  SpO2: 99% 100% 100%   Weight:      Height:        Intake/Output Summary (Last 24 hours) at 05/08/2019 1423 Last data filed at  05/08/2019 0848 Gross per 24 hour  Intake 2680 ml  Output 200 ml  Net 2480 ml   Filed Weights   05/07/19 0107  Weight: 57.1 kg   Examination: Physical Exam:  Constitutional: Thin Caucasian female currently in no acute distress appears calm talking on the telephone but does appear uncomfortable secondary to pain from her axilla, NAD and appears calm and comfortable Eyes: Lids and conjunctivae normal, sclerae anicteric  ENMT: External Ears, Nose appear normal. Grossly normal hearing Neck: Appears normal, supple, no cervical masses, normal ROM, no appreciable thyromegaly; no JVD Respiratory: Diminished to auscultation bilaterally, no wheezing, rales, rhonchi or crackles. Normal respiratory effort and patient is not tachypenic. No accessory muscle use.  Unlabored breathing and she is not wearing any supplemental oxygen via nasal cannula has a Port-A-Cath in the left side of her chest wall Cardiovascular: RRR, no murmurs / rubs / gallops. S1  and S2 auscultated.  Abdomen: Soft, non-tender, non-distended. Bowel sounds positive.  GU: Deferred. Musculoskeletal: No clubbing / cyanosis of digits/nails. No joint deformity upper and lower extremities.  Skin: Has a painful right axilla lesion in which is invaded her chest wall .  No appreciable rashes on limited skin evaluation.  Neurologic: CN 2-12 grossly intact with no focal deficits. Romberg sign and cerebellar reflexes not assessed.  Psychiatric: Normal judgment and insight. Alert and oriented x 3. Normal mood and appropriate affect.    Data Reviewed: I have personally reviewed following labs and imaging studies  CBC: Recent Labs  Lab 05/06/19 1056 05/07/19 0148 05/07/19 1043 05/08/19 0935  WBC 39.0 Repeated and verified X2.* 34.9* 31.7* 30.5*  NEUTROABS 35.0* 29.8*  --  29.0*  HGB 14.7 13.2 13.5 11.7*  HCT 44.3 41.7 41.6 36.5  MCV 88.8 92.5 90.6 93.1  PLT 508.0 Repeated and verified X2.* 460* 431* 732   Basic Metabolic  Panel: Recent Labs  Lab 05/07/19 0148 05/07/19 1043 05/07/19 1822 05/08/19 0357  NA 141 142 136 141  K 3.7 4.1 5.5* 3.0*  CL 108 109 106 112*  CO2 20* 21* 21* 22  GLUCOSE 79 78 107* 102*  BUN 24* 24* 24* 18  CREATININE 1.45* 1.50* 1.60* 1.42*  CALCIUM 12.5* 13.0* 11.8* 11.8*  MG 2.1  --   --  1.7  PHOS 4.8*  --   --  3.0   GFR: Estimated Creatinine Clearance: 40.1 mL/min (A) (by C-G formula based on SCr of 1.42 mg/dL (H)). Liver Function Tests: Recent Labs  Lab 05/06/19 1056 05/07/19 0148 05/07/19 1043 05/07/19 1822 05/08/19 0357  AST 486*  --  219* 176* 87*  ALT 741*  --  489* 357* 261*  ALKPHOS 225*  --  176* 169* 150*  BILITOT 2.0*  --  2.5* 2.4* 1.5*  PROT 7.2  --  6.2* 5.3* 4.8*  ALBUMIN 3.2* 2.4* 2.5* 2.3* 1.8*   Recent Labs  Lab 05/07/19 0148  LIPASE 16   No results for input(s): AMMONIA in the last 168 hours. Coagulation Profile: Recent Labs  Lab 05/06/19 1056  INR 1.3*   Cardiac Enzymes: No results for input(s): CKTOTAL, CKMB, CKMBINDEX, TROPONINI in the last 168 hours. BNP (last 3 results) No results for input(s): PROBNP in the last 8760 hours. HbA1C: No results for input(s): HGBA1C in the last 72 hours. CBG: No results for input(s): GLUCAP in the last 168 hours. Lipid Profile: No results for input(s): CHOL, HDL, LDLCALC, TRIG, CHOLHDL, LDLDIRECT in the last 72 hours. Thyroid Function Tests: Recent Labs    05/06/19 1929 05/06/19 1929 05/07/19 0148  TSH 3.450   < > 3.746  FREET4 1.79*  --   --    < > = values in this interval not displayed.   Anemia Panel: No results for input(s): VITAMINB12, FOLATE, FERRITIN, TIBC, IRON, RETICCTPCT in the last 72 hours. Sepsis Labs: Recent Labs  Lab 05/07/19 0148  PROCALCITON 1.50    Recent Results (from the past 240 hour(s))  Blood culture (routine x 2)     Status: None (Preliminary result)   Collection Time: 05/06/19  9:42 PM   Specimen: BLOOD  Result Value Ref Range Status   Specimen  Description BLOOD LEFT ARM  Final   Special Requests   Final    BOTTLES DRAWN AEROBIC AND ANAEROBIC Blood Culture results may not be optimal due to an excessive volume of blood received in culture bottles   Culture   Final  NO GROWTH 2 DAYS Performed at [facility], [place] [address] [place], [place] [address]    Report Status PENDING  Incomplete  Respiratory Panel by RT PCR (Flu A&B, Covid) - Nasopharyngeal Swab     Status: None   Collection Time: 05/06/19 10:35 PM   Specimen: Nasopharyngeal Swab  Result Value Ref Range Status   SARS Coronavirus 2 by RT PCR NEGATIVE NEGATIVE Final    Comment: (NOTE) SARS-CoV-2 target nucleic acids are NOT DETECTED. The SARS-CoV-2 RNA is generally detectable in upper respiratoy specimens during the acute phase of infection. The lowest concentration of SARS-CoV-2 viral copies this assay can detect is 131 copies/mL. A negative result does not preclude SARS-Cov-2 infection and should not be used as the sole basis for treatment or other patient management decisions. A negative result may occur with  improper specimen collection/handling, submission of specimen other than nasopharyngeal swab, presence of viral mutation(s) within the areas targeted by this assay, and inadequate number of viral copies (<131 copies/mL). A negative result must be combined with clinical observations, patient history, and epidemiological information. The expected result is Negative. Fact Sheet for Patients:  PinkCheek.be Fact Sheet for Healthcare Providers:  GravelBags.it This test is not yet ap proved or cleared by the [place] FDA and  has been authorized for detection and/or diagnosis of SARS-CoV-2 by FDA under an Emergency Use Authorization (EUA). This EUA will remain  in effect (meaning this test can be used) for the duration of the COVID-19 declaration under Section 564(b)(1) of the Act, 21  U.S.C. section 360bbb-3(b)(1), unless the authorization is terminated or revoked sooner.    Influenza A by PCR NEGATIVE NEGATIVE Final   Influenza B by PCR NEGATIVE NEGATIVE Final    Comment: (NOTE) The Xpert Xpress SARS-CoV-2/FLU/RSV assay is intended as an aid in  the diagnosis of influenza from Nasopharyngeal swab specimens and  should not be used as a sole basis for treatment. Nasal washings and  aspirates are unacceptable for Xpert Xpress SARS-CoV-2/FLU/RSV  testing. Fact Sheet for Patients: PinkCheek.be Fact Sheet for Healthcare Providers: GravelBags.it This test is not yet approved or cleared by the [place] FDA and  has been authorized for detection and/or diagnosis of SARS-CoV-2 by  FDA under an Emergency Use Authorization (EUA). This EUA will remain  in effect (meaning this test can be used) for the duration of the  Covid-19 declaration under Section 564(b)(1) of the Act, 21  U.S.C. section 360bbb-3(b)(1), unless the authorization is  terminated or revoked. Performed at [facility], [place] [address], [place], [place] [address]   Culture, Urine     Status: None   Collection Time: 05/07/19  1:21 AM   Specimen: Urine, Random  Result Value Ref Range Status   Specimen Description URINE, RANDOM  Final   Special Requests NONE  Final   Culture   Final    NO GROWTH Performed at [facility], [place] [address] [place], [place] [address]    Report Status 05/08/2019 FINAL  Final  Blood culture (routine x 2)     Status: None (Preliminary result)   Collection Time: 05/07/19  3:13 AM   Specimen: BLOOD  Result Value Ref Range Status   Specimen Description BLOOD LEFT ARM  Final   Special Requests   Final    BOTTLES DRAWN AEROBIC ONLY Blood Culture adequate volume   Culture   Final    NO GROWTH 1 DAY Performed at [facility], [address] [place], [place] [address]    Report Status PENDING   Incomplete     RN Pressure Injury Documentation:     Estimated body mass index is 22.3 kg/m as calculated from the following:   Height as of this encounter: 5' 3"  (1.6 m).   Weight as of this encounter: 57.1 kg.  Malnutrition Type:      Malnutrition Characteristics:      Nutrition Interventions:     Radiology Studies: CT Angio Chest PE W and/or Wo Contrast  Result Date: 05/07/2019 CLINICAL DATA:  History of melanoma. Axillary biopsy 5 weeks ago. Pain, swelling and inability to raise the right arm. No chest pain or shortness of breath. EXAM: CT ANGIOGRAPHY CHEST WITH CONTRAST TECHNIQUE: Multidetector CT imaging of the chest was performed using the standard protocol during bolus administration of intravenous contrast. Multiplanar CT image reconstructions and MIPs were obtained to evaluate the vascular anatomy. CONTRAST:  60 cc OMNIPAQUE IOHEXOL 350 MG/ML SOLN COMPARISON:  Chest CT 04/28/2019 FINDINGS: Cardiovascular: The heart is normal in size. No pericardial effusion. The aorta is normal in caliber. No dissection. The branch vessels are patent. No coronary artery calcifications. The pulmonary arterial tree is well opacified. No filling defects to suggest pulmonary embolism. Mediastinum/Nodes: Small scattered mediastinal and hilar lymph nodes are stable. No mass or overt adenopathy. The esophagus is grossly normal. The thyroid gland is normal. Lungs/Pleura: New small right pleural effusion with overlying atelectasis. No focal infiltrates or worrisome pulmonary nodules. Upper Abdomen: No significant upper abdominal findings. Chest wall/musculoskeletal: Extensive and progressive infiltrating tumor in the right axilla and invading the right chest wall. This measures a maximum of 10.2 x 6.2 cm on image 47/6. This previously measured 8.1 x 3.4 cm. Tumor is now bulging the pleura. Do not see any definite direct bony invasion or destruction. Suspect direct invasion of the latissimus dorsi,  serratus anterior and subscapularis muscles. Review of the MIP images confirms the above findings. IMPRESSION: 1. No CT findings for pulmonary embolism. 2. Extensive, aggressive and progressive infiltrating tumor in the right axilla. This is invading the right chest wall and involving the right chest wall musculature as above. 3. No mediastinal or hilar mass or adenopathy. 4. New small right pleural effusion with overlying atelectasis. No definite metastatic pulmonary nodules. Electronically Signed   By: Marijo Sanes M.D.   On: 05/07/2019 07:55   MR ABDOMEN MRCP W WO CONTAST  Result Date: 05/07/2019 CLINICAL DATA:  Acute cholangitis. Mild biliary ductal dilatation. Leukocytosis. Melanoma and right breast carcinoma. EXAM: MRI ABDOMEN WITHOUT AND WITH CONTRAST (INCLUDING MRCP) TECHNIQUE: Multiplanar multisequence MR imaging of the abdomen was performed both before and after the administration of intravenous contrast. Heavily T2-weighted images of the biliary and pancreatic ducts were obtained, and three-dimensional MRCP images were rendered by post processing. CONTRAST:  5.52m GADAVIST GADOBUTROL 1 MMOL/ML IV SOLN COMPARISON:  Ultrasound on 05/06/2019 and CT on 04/28/2019 FINDINGS: Lower chest: Small right pleural effusion. Subcutaneous edema in right chest and abdominal wall. Small fluid collection in right breast, likely representing a postop seroma. Hepatobiliary: No evidence of hepatic mass or abscess. Unremarkable appearance of gallbladder. Common bile duct measures 8 mm in diameter. No evidence of common bile duct wall thickening or enhancement. No evidence of choledocholithiasis or biliary stricture. Pancreas: No mass or inflammatory changes. No evidence of pancreatic ductal dilatation or pancreas divisum. Spleen:  Within normal limits in size and appearance. Adrenals/Urinary Tract: No masses identified. No evidence of hydronephrosis. Stomach/Bowel: Visualized portion  unremarkable. Vascular/Lymphatic: No  pathologically enlarged lymph nodes identified. No abdominal aortic aneurysm. Other:  None. Musculoskeletal:  No suspicious bone lesions identified. IMPRESSION: 1. No evidence of biliary obstruction or choledocholithiasis. 2. No evidence of hepatic abscess or mass. 3. Small right pleural effusion, and right chest and abdominal wall edema. Probable postop seroma in the right breast. Electronically Signed   By: Marlaine Hind M.D.   On: 05/07/2019 08:05   US Abdomen Limited RUQ  Result Date: 05/06/2019 CLINICAL DATA:  Right upper quadrant abdominal pain. EXAM: ULTRASOUND ABDOMEN LIMITED RIGHT UPPER QUADRANT COMPARISON:  CT 04/28/2019 at [facility]: Gallbladder: Distended. No gallstones or wall thickening visualized. No sonographic Murphy sign noted by sonographer. Common bile duct: Diameter: Dilated measuring 10 mm at the porta hepatis, 8 mm distally. Liver: No focal lesion identified. Within normal limits in parenchymal echogenicity. Portal vein is patent on color Doppler imaging with normal direction of blood flow towards the liver. Other: Right pleural effusion, not seen on recent CT. IMPRESSION: 1. Biliary dilatation with common bile duct measuring 10 mm. This is new from CT 1 week ago. Recommend further evaluation with MRCP or ERCP. 2. Gallbladder is mildly distended, but otherwise normal. No gallstones. 3. Right pleural effusion incidentally noted, new from prior CT. Electronically Signed   By: Keith Rake M.D.   On: 05/06/2019 20:50   Scheduled Meds:  Chlorhexidine Gluconate Cloth  6 each Topical Daily   loratadine  10 mg Oral Daily   potassium chloride  40 mEq Oral BID   sodium chloride flush  10-40 mL Intracatheter Q12H   Continuous Infusions:  sodium chloride 75 mL/hr at 05/08/19 1007   ceFEPime (MAXIPIME) IV 2 g (05/08/19 1017)   vancomycin 750 mg (05/07/19 2046)    LOS: 2 days   Kerney Elbe, DO [facility] PAGER is on AMION  If 7PM-7AM, please  contact night-coverage [email or web address]

## 2019-05-08 NOTE — Progress Notes (Signed)
[facility] ROUNDING NOTE   Subjective: Complains of right side axillary and chest wall pain    Objective: Vital signs in last 24 hours: Temp:  [97.6 F (36.4 C)-99.7 F (37.6 C)] 97.6 F (36.4 C) (03/13 0835) Pulse Rate:  [97-107] 107 (03/13 0835) Resp:  [18] 18 (03/13 0835) BP: (107-143)/(67-87) 120/71 (03/13 0835) SpO2:  [96 %-100 %] 100 % (03/13 0835) Last BM Date: 05/05/19 General: NAD Abdomen: Soft nontender no distention, no palpable mass or hepatomegaly    Intake/Output from previous day: 03/12 0701 - 03/13 0700 In: 2989.9 [P.O.:415; I.V.:2310.3; IV Piggyback:264.7] Out: 200 [Urine:200] Intake/Output this shift: Total I/O In: 100 [P.O.:100] Out: -    Lab Results: Recent Labs    05/07/19 0148 05/07/19 1043 05/08/19 0935  WBC 34.9* 31.7* 30.5*  HGB 13.2 13.5 11.7*  PLT 460* 431* 358  MCV 92.5 90.6 93.1   BMET Recent Labs    05/07/19 1043 05/07/19 1822 05/08/19 0357  NA 142 136 141  K 4.1 5.5* 3.0*  CL 109 106 112*  CO2 21* 21* 22  GLUCOSE 78 107* 102*  BUN 24* 24* 18  CREATININE 1.50* 1.60* 1.42*  CALCIUM 13.0* 11.8* 11.8*   LFT Recent Labs    05/06/19 1056 05/07/19 0148 05/07/19 1043 05/07/19 1822 05/08/19 0357  PROT 7.2  --  6.2* 5.3* 4.8*  ALBUMIN 3.2*   < > 2.5* 2.3* 1.8*  AST 486*  --  219* 176* 87*  ALT 741*  --  489* 357* 261*  ALKPHOS 225*  --  176* 169* 150*  BILITOT 2.0*  --  2.5* 2.4* 1.5*  BILIDIR 1.5*  --   --   --   --    < > = values in this interval not displayed.   PT/INR Recent Labs    05/06/19 1056  INR 1.3*      Imaging/Other results: CT Angio Chest PE W and/or Wo Contrast  Result Date: 05/07/2019 CLINICAL DATA:  History of melanoma. Axillary biopsy 5 weeks ago. Pain, swelling and inability to raise the right arm. No chest pain or shortness of breath. EXAM: CT ANGIOGRAPHY CHEST WITH CONTRAST TECHNIQUE: Multidetector CT imaging of the chest was performed using the standard protocol during  bolus administration of intravenous contrast. Multiplanar CT image reconstructions and MIPs were obtained to evaluate the vascular anatomy. CONTRAST:  60 cc OMNIPAQUE IOHEXOL 350 MG/ML SOLN COMPARISON:  Chest CT 04/28/2019 FINDINGS: Cardiovascular: The heart is normal in size. No pericardial effusion. The aorta is normal in caliber. No dissection. The branch vessels are patent. No coronary artery calcifications. The pulmonary arterial tree is well opacified. No filling defects to suggest pulmonary embolism. Mediastinum/Nodes: Small scattered mediastinal and hilar lymph nodes are stable. No mass or overt adenopathy. The esophagus is grossly normal. The thyroid gland is normal. Lungs/Pleura: New small right pleural effusion with overlying atelectasis. No focal infiltrates or worrisome pulmonary nodules. Upper Abdomen: No significant upper abdominal findings. Chest wall/musculoskeletal: Extensive and progressive infiltrating tumor in the right axilla and invading the right chest wall. This measures a maximum of 10.2 x 6.2 cm on image 47/6. This previously measured 8.1 x 3.4 cm. Tumor is now bulging the pleura. Do not see any definite direct bony invasion or destruction. Suspect direct invasion of the latissimus dorsi, serratus anterior and subscapularis muscles. Review of the MIP images confirms the above findings. IMPRESSION: 1. No CT findings for pulmonary embolism. 2. Extensive, aggressive and progressive infiltrating tumor in the right axilla. This is  invading the right chest wall and involving the right chest wall musculature as above. 3. No mediastinal or hilar mass or adenopathy. 4. New small right pleural effusion with overlying atelectasis. No definite metastatic pulmonary nodules. Electronically Signed   By: Marijo Sanes M.D.   On: 05/07/2019 07:55   MR ABDOMEN MRCP W WO CONTAST  Result Date: 05/07/2019 CLINICAL DATA:  Acute cholangitis. Mild biliary ductal dilatation. Leukocytosis. Melanoma and right  breast carcinoma. EXAM: MRI ABDOMEN WITHOUT AND WITH CONTRAST (INCLUDING MRCP) TECHNIQUE: Multiplanar multisequence MR imaging of the abdomen was performed both before and after the administration of intravenous contrast. Heavily T2-weighted images of the biliary and pancreatic ducts were obtained, and three-dimensional MRCP images were rendered by post processing. CONTRAST:  5.23mL GADAVIST GADOBUTROL 1 MMOL/ML IV SOLN COMPARISON:  Ultrasound on 05/06/2019 and CT on 04/28/2019 FINDINGS: Lower chest: Small right pleural effusion. Subcutaneous edema in right chest and abdominal wall. Small fluid collection in right breast, likely representing a postop seroma. Hepatobiliary: No evidence of hepatic mass or abscess. Unremarkable appearance of gallbladder. Common bile duct measures 8 mm in diameter. No evidence of common bile duct wall thickening or enhancement. No evidence of choledocholithiasis or biliary stricture. Pancreas: No mass or inflammatory changes. No evidence of pancreatic ductal dilatation or pancreas divisum. Spleen:  Within normal limits in size and appearance. Adrenals/Urinary Tract: No masses identified. No evidence of hydronephrosis. Stomach/Bowel: Visualized portion unremarkable. Vascular/Lymphatic: No pathologically enlarged lymph nodes identified. No abdominal aortic aneurysm. Other:  None. Musculoskeletal:  No suspicious bone lesions identified. IMPRESSION: 1. No evidence of biliary obstruction or choledocholithiasis. 2. No evidence of hepatic abscess or mass. 3. Small right pleural effusion, and right chest and abdominal wall edema. Probable postop seroma in the right breast. Electronically Signed   By: Marlaine Hind M.D.   On: 05/07/2019 08:05   US Abdomen Limited RUQ  Result Date: 05/06/2019 CLINICAL DATA:  Right upper quadrant abdominal pain. EXAM: ULTRASOUND ABDOMEN LIMITED RIGHT UPPER QUADRANT COMPARISON:  CT 04/28/2019 at [place]: Gallbladder: Distended. No gallstones or  wall thickening visualized. No sonographic Murphy sign noted by sonographer. Common bile duct: Diameter: Dilated measuring 10 mm at the porta hepatis, 8 mm distally. Liver: No focal lesion identified. Within normal limits in parenchymal echogenicity. Portal vein is patent on color Doppler imaging with normal direction of blood flow towards the liver. Other: Right pleural effusion, not seen on recent CT. IMPRESSION: 1. Biliary dilatation with common bile duct measuring 10 mm. This is new from CT 1 week ago. Recommend further evaluation with MRCP or ERCP. 2. Gallbladder is mildly distended, but otherwise normal. No gallstones. 3. Right pleural effusion incidentally noted, new from prior CT. Electronically Signed   By: Keith Rake M.D.   On: 05/06/2019 20:50      Assessment &Plan  49 year old female with metastatic melanoma  Elevated LFTs likely secondary to drug-induced liver injury  No evidence of liver metastatic lesion, CBD dilation, cholelithiasis on imaging Viral hepatitis panel negative.  Immune to EBV but no acute infection  LFTs are trending down, total bili down to 1.5  No indication for ERCP  Continue to monitor LFT  Significant leukocytosis, slightly lower this morning, no clear source of infection.  On broad-spectrum IV antibiotics  Plan to start on systemic immunotherapy per oncology for metastatic melanoma  GI will sign off, available if have any questions or concerns.   Damaris Hippo , MD [phone]  [facility]

## 2019-05-08 NOTE — Progress Notes (Signed)
Pt has K+ level of 5.5 and creatinine 1.60. Jeannette Corpus, NP notified and ordered to give 1li NS bolus.

## 2019-05-09 ENCOUNTER — Inpatient Hospital Stay (HOSPITAL_COMMUNITY): Payer: Managed Care, Other (non HMO)

## 2019-05-09 DIAGNOSIS — M7989 Other specified soft tissue disorders: Secondary | ICD-10-CM

## 2019-05-09 DIAGNOSIS — K5900 Constipation, unspecified: Secondary | ICD-10-CM

## 2019-05-09 LAB — CBC WITH DIFFERENTIAL/PLATELET
Abs Immature Granulocytes: 0.33 10*3/uL — ABNORMAL HIGH (ref 0.00–0.07)
Basophils Absolute: 0.2 10*3/uL — ABNORMAL HIGH (ref 0.0–0.1)
Basophils Relative: 1 %
Eosinophils Absolute: 0.1 10*3/uL (ref 0.0–0.5)
Eosinophils Relative: 0 %
HCT: 34.9 % — ABNORMAL LOW (ref 36.0–46.0)
Hemoglobin: 11.4 g/dL — ABNORMAL LOW (ref 12.0–15.0)
Immature Granulocytes: 1 %
Lymphocytes Relative: 5 %
Lymphs Abs: 1.4 10*3/uL (ref 0.7–4.0)
MCH: 29.3 pg (ref 26.0–34.0)
MCHC: 32.7 g/dL (ref 30.0–36.0)
MCV: 89.7 fL (ref 80.0–100.0)
Monocytes Absolute: 3.2 10*3/uL — ABNORMAL HIGH (ref 0.1–1.0)
Monocytes Relative: 11 %
Neutro Abs: 24.3 10*3/uL — ABNORMAL HIGH (ref 1.7–7.7)
Neutrophils Relative %: 82 %
Platelets: 341 10*3/uL (ref 150–400)
RBC: 3.89 MIL/uL (ref 3.87–5.11)
RDW: 13.5 % (ref 11.5–15.5)
WBC: 29.4 10*3/uL — ABNORMAL HIGH (ref 4.0–10.5)
nRBC: 0 % (ref 0.0–0.2)

## 2019-05-09 LAB — COMPREHENSIVE METABOLIC PANEL
ALT: 209 U/L — ABNORMAL HIGH (ref 0–44)
AST: 109 U/L — ABNORMAL HIGH (ref 15–41)
Albumin: 1.7 g/dL — ABNORMAL LOW (ref 3.5–5.0)
Alkaline Phosphatase: 154 U/L — ABNORMAL HIGH (ref 38–126)
Anion gap: 8 (ref 5–15)
BUN: 15 mg/dL (ref 6–20)
CO2: 17 mmol/L — ABNORMAL LOW (ref 22–32)
Calcium: 9.7 mg/dL (ref 8.9–10.3)
Chloride: 114 mmol/L — ABNORMAL HIGH (ref 98–111)
Creatinine, Ser: 1.29 mg/dL — ABNORMAL HIGH (ref 0.44–1.00)
GFR calc Af Amer: 57 mL/min — ABNORMAL LOW (ref >60)
GFR calc non Af Amer: 49 mL/min — ABNORMAL LOW (ref >60)
Glucose, Bld: 85 mg/dL (ref 70–99)
Potassium: 3.6 mmol/L (ref 3.5–5.1)
Sodium: 139 mmol/L (ref 135–145)
Total Bilirubin: 1.2 mg/dL (ref 0.3–1.2)
Total Protein: 4.8 g/dL — ABNORMAL LOW (ref 6.5–8.1)

## 2019-05-09 LAB — HEPARIN LEVEL (UNFRACTIONATED): Heparin Unfractionated: <0.1 IU/mL — ABNORMAL LOW (ref 0.30–0.70)

## 2019-05-09 LAB — PTH, INTACT AND CALCIUM
Calcium, Total (PTH): 11.8 mg/dL — ABNORMAL HIGH (ref 8.7–10.2)
PTH: 16 pg/mL (ref 15–65)

## 2019-05-09 LAB — MAGNESIUM: Magnesium: 1.9 mg/dL (ref 1.7–2.4)

## 2019-05-09 LAB — PHOSPHORUS: Phosphorus: 1.8 mg/dL — ABNORMAL LOW (ref 2.5–4.6)

## 2019-05-09 MED ORDER — SENNOSIDES-DOCUSATE SODIUM 8.6-50 MG PO TABS
1.0000 | ORAL_TABLET | Freq: Two times a day (BID) | ORAL | Status: DC
  Administered 2019-05-09 – 2019-05-14 (×6): 1 via ORAL
  Filled 2019-05-09 (×7): qty 1

## 2019-05-09 MED ORDER — HEPARIN (PORCINE) 25000 UT/250ML-% IV SOLN
2350.0000 [IU]/h | INTRAVENOUS | Status: DC
  Administered 2019-05-09: 900 [IU]/h via INTRAVENOUS
  Administered 2019-05-10: 1300 [IU]/h via INTRAVENOUS
  Administered 2019-05-11: 1500 [IU]/h via INTRAVENOUS
  Administered 2019-05-11: 1950 [IU]/h via INTRAVENOUS
  Administered 2019-05-12: 2100 [IU]/h via INTRAVENOUS
  Administered 2019-05-12: 2250 [IU]/h via INTRAVENOUS
  Administered 2019-05-13: 2350 [IU]/h via INTRAVENOUS
  Filled 2019-05-09 (×11): qty 250

## 2019-05-09 MED ORDER — BISACODYL 10 MG RE SUPP: 10.0000 mg | Freq: Every day | RECTAL | Status: DC | PRN

## 2019-05-09 MED ORDER — POTASSIUM PHOSPHATES 15 MMOLE/5ML IV SOLN
20.0000 mmol | Freq: Once | INTRAVENOUS | Status: AC
  Administered 2019-05-09: 20 mmol via INTRAVENOUS
  Filled 2019-05-09: qty 6.67

## 2019-05-09 MED ORDER — HEPARIN BOLUS VIA INFUSION
2500.0000 [IU] | Freq: Once | INTRAVENOUS | Status: AC
  Administered 2019-05-09: 2500 [IU] via INTRAVENOUS
  Filled 2019-05-09: qty 2500

## 2019-05-09 MED ORDER — POLYETHYLENE GLYCOL 3350 17 G PO PACK
17.0000 g | PACK | Freq: Two times a day (BID) | ORAL | Status: DC
  Administered 2019-05-09 – 2019-05-13 (×4): 17 g via ORAL
  Filled 2019-05-09 (×5): qty 1

## 2019-05-09 MED ORDER — SODIUM BICARBONATE 650 MG PO TABS
650.0000 mg | ORAL_TABLET | Freq: Two times a day (BID) | ORAL | Status: DC
  Administered 2019-05-09 – 2019-05-14 (×11): 650 mg via ORAL
  Filled 2019-05-09 (×11): qty 1

## 2019-05-09 NOTE — Progress Notes (Signed)
PROGRESS NOTE    Angelica Dunn  VFI:[id number] DOB: 1970/05/22 DOA: 05/06/2019 PCP: Renaldo Reel, PA    Brief Narrative:  HPI per Dr. Dana Allan on 05/06/19 Patient is a 49 year old lady with history of recurrent melanoma with lymph node involvement.  Patient underwent right axillary lymph node dissection on March 31, 2019, as well as, placement of port for possible immunotherapy.  Postop.  Has been complicated by significantly elevated liver enzymes.  Patient was referred to the GI team by the PCP, patient was seen earlier today.  Liver enzymes remain significantly elevated.  Additionally, patient has significant leukocytosis, with WBC of 39,000.  Patient denies fever, chills, headache, neck pain, URI symptoms, chest pain, shortness of breath, GI symptoms or urinary symptoms.  Abdominal ultrasound revealed biliary dilatation with common bile duct measuring 10 mm.  Gallbladder was said to be mildly distended, but otherwise normal.  No gallstones.  Right-sided pleural effusion was incidentally noted, which is said to be new.  Patient will be admitted for further assessment and management.  ED Course: On presentation to the hospital, vitals revealed temperature of 98.5, heart rate of 140 140 bpm, respiratory rate of 15, blood pressure of 110/74 mmHg and O2 sat of 99%.  Patient has been pancultured by the ER providers.  Patient will be started IV vancomycin and Zosyn.  Pertinent labs: CBC reveals WBC of 39, hemoglobin of 14.7, hematocrit of 44.3 with platelet count of 500.  Test revealed ALT of 741, AST of 486, alkaline phosphatase of 225, albumin of 3.2, direct bilirubin of 1.5 with total bilirubin of 2 and total protein of 7.2.  Imaging: independently reviewed.   Review of Systems:  Negative for fever, visual changes, sore throat, rash, new muscle aches, chest pain, SOB, dysuria, bleeding, n/v/abdominal pain.  Patient has right axillary discomfort/pain following lymph node  dissection on March 30, 2019.  **Interim History Patient was still complaining of some right axillary pain that was her main complaint today and states that her arm felt "heavy".  She thinks it is a little bit more swollen and heavier so we will obtain a upper extremity duplex to rule out DVT as it did look a little bit more swollen.   Patient was happy to learn that her LFTs are trending down along with her bilirubin.  She was started on IV fluids and diet given that no ERCP will be done .  General Surgery recommends continuing IV antibiotics at this time empirically.  Gastroenterology is now signed off as they felt that her elevated LFTs were secondary to drug-induced liver injury question if this is secondary to autoimmune parameters given that she has a positive ANA.  Today the patient has also been complaining about some constipation.  Assessment & Plan:   Active Problems:   Leukocytosis   Melanoma of skin (HCC)  Leukocytosis with SIRS-like picture, present on admission, slowly trending down -Admitted patient to inpatient MedSurg -Patient was tachycardic and has a leukocytosis but currently has no source of infection -WBC was close to 40,000 on admission is now trending downwards and WBC is now 29,400 -Panculture patient and obtain blood cultures, urine culture, urinalysis as well as a respiratory virus panel and mono screen -MRI of the abdomen as below -Broad-spectrum antibiotics changed to IV Vancomycin/IV Cefepime now; I stopped the patient's vancomycin as she is MRSA PCR negative and will continue empiric Ceftin appearing for now -General surgery recommending just IV antibiotics -Started the patient on normal saline at 75  mils per hour and will continue; 1 L of normal saline in the ED was given 1 L normal saline bolus yesterday -Follow cultures -Procalcitonin level is 1.50 -Repeat CBC in the AM -Obtain documentation from patient's oncologist, Dr. Bobby Rumpf, based in [place], [place]  [facility]). -Further management depend on hospital course.  Recurrent melanoma with Metastatic Lymph node involvement associated with malaise and fatigue -Patient is status post lymph node dissection. -She has had 7 out of 19 lymph nodes positive for metastatic melanoma and breast lesion was complex sclerosing lesion -He has a port in place but has not started immunotherapy due to her abnormalities -Immunotherapy is currently on hold due to abnormal liver function tests and will be starting outpatient -GI team is managing abnormal LFTs and recommends continue to monitor for now and no ERCP. -Patient is known to an oncologist based [place], [place] have consulted medical oncology for further evaluation recommendations and they feel that no inpatient treatment is recommended at this time -CT of the chest PE was done and showed "No CT findings for pulmonary embolism. Extensive, aggressive and progressive infiltrating tumor in the right axilla. This is invading the right chest wall and involving the right chest wall musculature as above. No mediastinal or hilar mass or adenopathy.  New small right pleural effusion with overlying atelectasis. No definite metastatic pulmonary nodules." -Has worsening Right Arm Swelling so will obtain a RUQ Venous Duplex -Patient will receive Keytruda in outpatient setting -We will continue to monitor carefully and appreciate further oncology recommendations  Right axillary pain secondary to above -We will oxycodone and have added IV fentanyl -Received IV morphine in the ED yesterday -Continue to adjust pain regimen; patient states that her pain is somewhat controlled with the fentanyl but does not last very long so increased Fentanyl to every 1 hours -Chec Right upper quadrant venous duplex  Renal insufficiency and question if this is chronic kidney disease Metabolic Acidosis -BUN/creatinine is now 18/1.42 -Started IV fluid hydration as  above -Patient's CO2 was 21, chloride level was 109, anion gap was 12; Today her CO2 is 17, chloride level is 114, and anion gap is 8 -Continue with IV fluid hydration as below and will also start sodium bicarbonate tablets 650 mg p.o. twice daily x2 doses -Avoid nephrotoxic medications, contrast dyes, hypotension and I have stopped her lisinopril and will change her IV Zosyn to IV cefepime -Renally adjust medications -Continue monitor trend liver function panel -Repeat CMP in a.m.  Hypercalcemia, likely of malignancy, improving -Likely malignancy in the setting of melanoma -Started on normal saline at 75 MLS per hour and will continue and calcium is now trending down -Calcium went from 13.0 yesterday and is now 9.7 this morning -Hematology/Oncology consulted for further evaluation and will check PTHrP and PTH which is still pending -We will give Zometa 4 mg IV x1  -Continue monitor and trend calcium level -Repeat CMP in a.m.  Thrombocytosis, improved -Trending downwards -Patient's platelet count went from 508,000 -> 431,000 -> 358,000 -> 341,000 -Continue to Monitor and Trend and Monitor for S/Sx of Bleeding  -Repeat CBC in AM   Abnormal LFTs and ? Autoimmune Hepatitis, improving  Hyperbilirubinemia, improving -Trending down now and went from an AST of 486 down to 109 today (Slightly up from 87 yesterday), and an ALT of 741 is now 261; Was higher on 3/4 -Her EBV IgG was 409 and IgM was less than 36.0 and an EBV NA IgG was 51.8 -T bili is now trending  down and went from 2.5 is now 1.2 -Alk phos went from 176 and is now 150 -MRI/MRCP of the abdomen showed "No evidence of biliary obstruction or choledocholithiasis. No evidence of hepatic abscess or mass. Small right pleural effusion, and right chest and abdominal wall edema. Probable postop seroma in the right breast." -Mono screen is negative -Alpha-1 antitrypsin serum level was greater than 300 -Ceruloplasmin was 44  -Continue to  monitor LFTs and trend -GI recommending no plans for ERCP and recommends continue empiric IV antibiotics and is waiting other viral hepatology's and have added an ANA as well as a CMV IgM.;  GI felt this was felt drug-induced liver injury but ? If this is Autoimmune -Patient's ANA was positive and her ANA pattern was cytoplasmic fluorescence for hep to slide an ANA titer was 1:80 -Will need to discuss with GI further and I have repaged Dr. Silverio Decamp and still awaiting call back -HCV quantitative log was less than 1.18 and HCV RNA PCR was less than 15 -Continue to follow Hepatic Fxn Panel and GI recommendations  Hypokalemia but was Hyperkalemic and is now improved -Patient's potassium this morning was 3.6 -Replete with IV K-Phos 20 mmol -Continue monitor and replete as necessary -Repeat CMP in the a.m.  Hypophosphatemia   -Patient's phosphorus level was 1.8 -Replete with IV K-Phos 20 mmol -Continue To monitor and replete as necessary -Repeat phosphorus level in the a.m.  Normocytic Anemia -Patient's Hgb/Hct went from 13.5/41.6 -> 11.7/36.5 -> 11.4/34.9 -Check Anemia Panel in the AM -Continue to Monitor for S/Sx of Bleeding; Currently no overt bleeding noted -Repeat CBC in AM   Constipation -Start bowel regimen with senna docusate 1 tab p.o. twice daily, MiraLAX 17 g p.o. twice daily and bisacodyl 10 mg rectal suppository daily as needed for moderate constipation -Continue to monitor and if not improving will try an enema  DVT prophylaxis: SCDs Code Status: FULL CODE  Family Communication: No family present at bedside  Disposition Plan: Patient is from home and now has significant leukocytosis and malaise and will need pending further work-up of her leukocytosis and improvement of LFTs and clearance by GI, general surgery as well as hematology oncology   Consultants:   Gastroenterology  General Surgery  Medical Oncology    Procedures: MRCP   Antimicrobials:   Anti-infectives (From admission, onward)   Start     Dose/Rate Route Frequency Ordered Stop   05/07/19 2200  vancomycin (VANCOREADY) IVPB 750 mg/150 mL  Status:  Discontinued     750 mg 150 mL/hr over 60 Minutes Intravenous Every 24 hours 05/07/19 0410 05/09/19 0938   05/07/19 1330  ceFEPIme (MAXIPIME) 2 g in sodium chloride 0.9 % 100 mL IVPB     2 g 200 mL/hr over 30 Minutes Intravenous Every 12 hours 05/07/19 1239     05/07/19 0600  piperacillin-tazobactam (ZOSYN) IVPB 3.375 g  Status:  Discontinued     3.375 g 12.5 mL/hr over 240 Minutes Intravenous Every 8 hours 05/07/19 0410 05/07/19 1238   05/06/19 2145  piperacillin-tazobactam (ZOSYN) IVPB 3.375 g     3.375 g 100 mL/hr over 30 Minutes Intravenous  Once 05/06/19 2144 05/06/19 2232   05/06/19 2145  vancomycin (VANCOREADY) IVPB 1250 mg/250 mL     1,250 mg 166.7 mL/hr over 90 Minutes Intravenous  Once 05/06/19 2144 05/07/19 0004     Subjective: Patient examined at bedside and she again was complaining of some right axillary pain and complained that it felt little more swollen and  heavy.  No nausea or vomiting states that she has not had a bowel movement since Wednesday.  Still does not feel as well.  No other concerns or complaints at this time.  Objective: Vitals:   05/08/19 1345 05/08/19 2207 05/09/19 0445 05/09/19 1359  BP: 139/87 135/80 124/84 133/89  Pulse: 98 (!) 109 100 (!) 106  Resp:  17 17 18   Temp: (!) 97.5 F (36.4 C) 98 F (36.7 C) 98.4 F (36.9 C)   TempSrc: Oral Oral Oral   SpO2:  100% 100% 100%  Weight:      Height:        Intake/Output Summary (Last 24 hours) at 05/09/2019 1434 Last data filed at 05/09/2019 1014 Gross per 24 hour  Intake 555 ml  Output 800 ml  Net -245 ml   Filed Weights   05/07/19 0107  Weight: 57.1 kg   Examination: Physical Exam:  Constitutional: Thin Caucasian female currently no acute distress appears again slightly uncomfortable from the pain in axilla and complaining that  her right arm is feeling heavier and is little bit more swollen., Eyes: Lids and conjunctivae normal, sclerae anicteric  ENMT: External Ears, Nose appear normal. Grossly normal hearing.  Neck: Appears normal, supple, no cervical masses, normal ROM, no appreciable thyromegaly; no JVD Respiratory: Mildly diminished to auscultation bilaterally, no wheezing, rales, rhonchi or crackles. Normal respiratory effort and patient is not tachypenic. No accessory muscle use.  Unlabored breathing Cardiovascular: Mildly tachycardic rate but regular rhythm, no murmurs / rubs / gallops. S1 and S2 auscultated.  Trace extremity edema. 2+ pedal pulses.  Abdomen: Soft, non-tender, non-distended. Bowel sounds positive.  GU: Deferred. Musculoskeletal: No clubbing / cyanosis of digits/nails.  Right arm is somewhat swollen compared to the left arm and has induration on the right axilla is painful Skin: Again as above has an indurated right axilla painful to palpation and right arm seems more swollen today than he did yesterday.  Is warm and dry Neurologic: CN 2-12 grossly intact with no focal deficits. Romberg sign and cerebellar reflexes not assessed.  Psychiatric: Normal judgment and insight. Alert and oriented x 3. Normal mood and appropriate affect.    Data Reviewed: I have personally reviewed following labs and imaging studies  CBC: Recent Labs  Lab 05/06/19 1056 05/07/19 0148 05/07/19 1043 05/08/19 0935 05/09/19 0433  WBC 39.0 Repeated and verified X2.* 34.9* 31.7* 30.5* 29.4*  NEUTROABS 35.0* 29.8*  --  29.0* 24.3*  HGB 14.7 13.2 13.5 11.7* 11.4*  HCT 44.3 41.7 41.6 36.5 34.9*  MCV 88.8 92.5 90.6 93.1 89.7  PLT 508.0 Repeated and verified X2.* 460* 431* 358 256   Basic Metabolic Panel: Recent Labs  Lab 05/07/19 0148 05/07/19 1043 05/07/19 1822 05/08/19 0357 05/09/19 0433  NA 141 142 136 141 139  K 3.7 4.1 5.5* 3.0* 3.6  CL 108 109 106 112* 114*  CO2 20* 21* 21* 22 17*  GLUCOSE 79 78 107* 102*  85  BUN 24* 24* 24* 18 15  CREATININE 1.45* 1.50* 1.60* 1.42* 1.29*  CALCIUM 12.5* 13.0* 11.8* 11.8* 9.7  MG 2.1  --   --  1.7 1.9  PHOS 4.8*  --   --  3.0 1.8*   GFR: Estimated Creatinine Clearance: 44.1 mL/min (A) (by C-G formula based on SCr of 1.29 mg/dL (H)). Liver Function Tests: Recent Labs  Lab 05/06/19 1056 05/06/19 1056 05/07/19 0148 05/07/19 1043 05/07/19 1822 05/08/19 0357 05/09/19 0433  AST 486*  --   --  219* 176* 87* 109*  ALT 741*  --   --  489* 357* 261* 209*  ALKPHOS 225*  --   --  176* 169* 150* 154*  BILITOT 2.0*  --   --  2.5* 2.4* 1.5* 1.2  PROT 7.2  --   --  6.2* 5.3* 4.8* 4.8*  ALBUMIN 3.2*   < > 2.4* 2.5* 2.3* 1.8* 1.7*   < > = values in this interval not displayed.   Recent Labs  Lab 05/07/19 0148  LIPASE 16   No results for input(s): AMMONIA in the last 168 hours. Coagulation Profile: Recent Labs  Lab 05/06/19 1056  INR 1.3*   Cardiac Enzymes: No results for input(s): CKTOTAL, CKMB, CKMBINDEX, TROPONINI in the last 168 hours. BNP (last 3 results) No results for input(s): PROBNP in the last 8760 hours. HbA1C: No results for input(s): HGBA1C in the last 72 hours. CBG: No results for input(s): GLUCAP in the last 168 hours. Lipid Profile: No results for input(s): CHOL, HDL, LDLCALC, TRIG, CHOLHDL, LDLDIRECT in the last 72 hours. Thyroid Function Tests: Recent Labs    05/06/19 1929 05/06/19 1929 05/07/19 0148  TSH 3.450   < > 3.746  FREET4 1.79*  --   --    < > = values in this interval not displayed.   Anemia Panel: No results for input(s): VITAMINB12, FOLATE, FERRITIN, TIBC, IRON, RETICCTPCT in the last 72 hours. Sepsis Labs: Recent Labs  Lab 05/07/19 0148  PROCALCITON 1.50    Recent Results (from the past 240 hour(s))  Blood culture (routine x 2)     Status: None (Preliminary result)   Collection Time: 05/06/19  9:42 PM   Specimen: BLOOD  Result Value Ref Range Status   Specimen Description BLOOD LEFT ARM  Final    Special Requests   Final    BOTTLES DRAWN AEROBIC AND ANAEROBIC Blood Culture results may not be optimal due to an excessive volume of blood received in culture bottles   Culture   Final    NO GROWTH 3 DAYS Performed at [facility], [place] [address] [place], [place] [address]    Report Status PENDING  Incomplete  Respiratory Panel by RT PCR (Flu A&B, Covid) - Nasopharyngeal Swab     Status: None   Collection Time: 05/06/19 10:35 PM   Specimen: Nasopharyngeal Swab  Result Value Ref Range Status   SARS Coronavirus 2 by RT PCR NEGATIVE NEGATIVE Final    Comment: (NOTE) SARS-CoV-2 target nucleic acids are NOT DETECTED. The SARS-CoV-2 RNA is generally detectable in upper respiratoy specimens during the acute phase of infection. The lowest concentration of SARS-CoV-2 viral copies this assay can detect is 131 copies/mL. A negative result does not preclude SARS-Cov-2 infection and should not be used as the sole basis for treatment or other patient management decisions. A negative result may occur with  improper specimen collection/handling, submission of specimen other than nasopharyngeal swab, presence of viral mutation(s) within the areas targeted by this assay, and inadequate number of viral copies (<131 copies/mL). A negative result must be combined with clinical observations, patient history, and epidemiological information. The expected result is Negative. Fact Sheet for Patients:  PinkCheek.be Fact Sheet for Healthcare Providers:  GravelBags.it This test is not yet ap proved or cleared by the [place] FDA and  has been authorized for detection and/or diagnosis of SARS-CoV-2 by FDA under an Emergency Use Authorization (EUA). This EUA will remain  in effect (meaning this test can be  used) for the duration of the COVID-19 declaration under Section 564(b)(1) of the Act, 21 U.S.C. section 360bbb-3(b)(1), unless the  authorization is terminated or revoked sooner.    Influenza A by PCR NEGATIVE NEGATIVE Final   Influenza B by PCR NEGATIVE NEGATIVE Final    Comment: (NOTE) The Xpert Xpress SARS-CoV-2/FLU/RSV assay is intended as an aid in  the diagnosis of influenza from Nasopharyngeal swab specimens and  should not be used as a sole basis for treatment. Nasal washings and  aspirates are unacceptable for Xpert Xpress SARS-CoV-2/FLU/RSV  testing. Fact Sheet for Patients: PinkCheek.be Fact Sheet for Healthcare Providers: GravelBags.it This test is not yet approved or cleared by the [place] FDA and  has been authorized for detection and/or diagnosis of SARS-CoV-2 by  FDA under an Emergency Use Authorization (EUA). This EUA will remain  in effect (meaning this test can be used) for the duration of the  Covid-19 declaration under Section 564(b)(1) of the Act, 21  U.S.C. section 360bbb-3(b)(1), unless the authorization is  terminated or revoked. Performed at [facility], [place] [address] [place], [place] [address]   Culture, Urine     Status: None   Collection Time: 05/07/19  1:21 AM   Specimen: Urine, Random  Result Value Ref Range Status   Specimen Description URINE, RANDOM  Final   Special Requests NONE  Final   Culture   Final    NO GROWTH Performed at [facility], [place] [address] [place], [place] [address]    Report Status 05/08/2019 FINAL  Final  Blood culture (routine x 2)     Status: None (Preliminary result)   Collection Time: 05/07/19  3:13 AM   Specimen: BLOOD  Result Value Ref Range Status   Specimen Description BLOOD LEFT ARM  Final   Special Requests   Final    BOTTLES DRAWN AEROBIC ONLY Blood Culture adequate volume   Culture   Final    NO GROWTH 2 DAYS Performed at [facility], [place] [address], [place], [place] [address]    Report Status PENDING  Incomplete  MRSA PCR Screening     Status: None    Collection Time: 05/08/19  5:19 PM   Specimen: Nasopharyngeal  Result Value Ref Range Status   MRSA by PCR NEGATIVE NEGATIVE Final    Comment:        The GeneXpert MRSA Assay (FDA approved for NASAL specimens only), is one component of a comprehensive MRSA colonization surveillance program. It is not intended to diagnose MRSA infection nor to guide or monitor treatment for MRSA infections. Performed at [facility], [place] [address], [place], [place] [address]      RN Pressure Injury Documentation:     Estimated body mass index is 22.3 kg/m as calculated from the following:   Height as of this encounter: 5' 3"  (1.6 m).   Weight as of this encounter: 57.1 kg.  Malnutrition Type:      Malnutrition Characteristics:      Nutrition Interventions:     Radiology Studies: No results found. Scheduled Meds: . Chlorhexidine Gluconate Cloth  6 each Topical Daily  . loratadine  10 mg Oral Daily  . polyethylene glycol  17 g Oral BID  . senna-docusate  1 tablet Oral BID  . sodium bicarbonate  650 mg Oral BID  . sodium chloride flush  10-40 mL Intracatheter Q12H   Continuous Infusions: . sodium chloride 75 mL/hr at 05/08/19 2206  .  ceFEPime (MAXIPIME) IV 2 g (05/09/19 0920)  . potassium PHOSPHATE IVPB (in mmol) 20 mmol (05/09/19 1059)    LOS: 3 days   Kerney Elbe, DO [facility] PAGER is on [place]  If 7PM-7AM, please contact night-coverage [email or web address]

## 2019-05-09 NOTE — Progress Notes (Signed)
VASCULAR LAB PRELIMINARY  PRELIMINARY  PRELIMINARY  PRELIMINARY  Right upper extremity venous duplex completed.    Preliminary report:  See CV proc for preliminary results.  Messaged Dr. Alfredia Ferguson with results.  Barton Fanny, RN, results.   Tyshea Imel, RVT 05/09/2019, 5:35 PM

## 2019-05-09 NOTE — Progress Notes (Signed)
ANTICOAGULATION CONSULT NOTE - Initial Consult  Pharmacy Consult for heparin Indication: DVT - RUE  Allergies  Allergen Reactions  . Gabapentin Other (See Comments)    "Made me feel odd, so I stopped taking it"    Patient Measurements: Height: 5\' 3"  (160 cm) Weight: 125 lb 14.1 oz (57.1 kg) IBW/kg (Calculated) : 52.4   Vital Signs: BP: 133/89 (03/14 1359) Pulse Rate: 106 (03/14 1359)  Labs: Recent Labs    05/06/19 1929 05/07/19 0148 05/07/19 1043 05/07/19 1043 05/07/19 1822 05/08/19 0357 05/08/19 0935 05/09/19 0433  HGB  --    < > 13.5   < >  --   --  11.7* 11.4*  HCT  --    < > 41.6  --   --   --  36.5 34.9*  PLT  --    < > 431*  --   --   --  358 341  CREATININE  --    < > 1.50*   < > 1.60* 1.42*  --  1.29*  TROPONINIHS 18*  --   --   --   --   --   --   --    < > = values in this interval not displayed.    Estimated Creatinine Clearance: 44.1 mL/min (A) (by C-G formula based on SCr of 1.29 mg/dL (H)).   Medical History: Past Medical History:  Diagnosis Date  . No pertinent past medical history       Assessment: 48yof with  history of recurrent melanoma with lymph node involvement s/p right axillary lymph node dissection in February. Complains of RUE swelling and pain + VTE - will being  Heparin drip.   Goal of Therapy:  Heparin level 0.3-0.7 units/ml Monitor platelets by anticoagulation protocol: Yes   Plan:  Heparin bolus 2500 utsiv x1  Heparin drip 900 uts/hr HL in 6hr from start and daily with CBC Monitor s/s bleeding   Bonnita Nasuti Pharm.D. CPP, BCPS Clinical Pharmacist [phone] 05/09/2019 5:00 PM

## 2019-05-10 ENCOUNTER — Inpatient Hospital Stay (HOSPITAL_COMMUNITY): Payer: Managed Care, Other (non HMO)

## 2019-05-10 DIAGNOSIS — D72829 Elevated white blood cell count, unspecified: Principal | ICD-10-CM

## 2019-05-10 DIAGNOSIS — C439 Malignant melanoma of skin, unspecified: Secondary | ICD-10-CM

## 2019-05-10 DIAGNOSIS — I82621 Acute embolism and thrombosis of deep veins of right upper extremity: Secondary | ICD-10-CM

## 2019-05-10 DIAGNOSIS — M7989 Other specified soft tissue disorders: Secondary | ICD-10-CM

## 2019-05-10 DIAGNOSIS — D649 Anemia, unspecified: Secondary | ICD-10-CM

## 2019-05-10 DIAGNOSIS — R2231 Localized swelling, mass and lump, right upper limb: Secondary | ICD-10-CM

## 2019-05-10 DIAGNOSIS — C779 Secondary and unspecified malignant neoplasm of lymph node, unspecified: Secondary | ICD-10-CM

## 2019-05-10 LAB — HEPARIN LEVEL (UNFRACTIONATED)
Heparin Unfractionated: <0.1 IU/mL — ABNORMAL LOW (ref 0.30–0.70)
Heparin Unfractionated: 0.13 IU/mL — ABNORMAL LOW (ref 0.30–0.70)

## 2019-05-10 LAB — CBC WITH DIFFERENTIAL/PLATELET
Abs Immature Granulocytes: 0.55 10*3/uL — ABNORMAL HIGH (ref 0.00–0.07)
Basophils Absolute: 0.2 10*3/uL — ABNORMAL HIGH (ref 0.0–0.1)
Basophils Relative: 1 %
Eosinophils Absolute: 0.2 10*3/uL (ref 0.0–0.5)
Eosinophils Relative: 1 %
HCT: 36.7 % (ref 36.0–46.0)
Hemoglobin: 11.8 g/dL — ABNORMAL LOW (ref 12.0–15.0)
Immature Granulocytes: 2 %
Lymphocytes Relative: 7 %
Lymphs Abs: 2.4 10*3/uL (ref 0.7–4.0)
MCH: 29.6 pg (ref 26.0–34.0)
MCHC: 32.2 g/dL (ref 30.0–36.0)
MCV: 92 fL (ref 80.0–100.0)
Monocytes Absolute: 3.5 10*3/uL — ABNORMAL HIGH (ref 0.1–1.0)
Monocytes Relative: 10 %
Neutro Abs: 26.9 10*3/uL — ABNORMAL HIGH (ref 1.7–7.7)
Neutrophils Relative %: 79 %
Platelets: 304 10*3/uL (ref 150–400)
RBC: 3.99 MIL/uL (ref 3.87–5.11)
RDW: 13.9 % (ref 11.5–15.5)
WBC: 33.7 10*3/uL — ABNORMAL HIGH (ref 4.0–10.5)
nRBC: 0 % (ref 0.0–0.2)

## 2019-05-10 LAB — IRON AND TIBC
Iron: 27 ug/dL — ABNORMAL LOW (ref 28–170)
Saturation Ratios: 20 % (ref 10.4–31.8)
TIBC: 133 ug/dL — ABNORMAL LOW (ref 250–450)
UIBC: 106 ug/dL

## 2019-05-10 LAB — COMPREHENSIVE METABOLIC PANEL
ALT: 212 U/L — ABNORMAL HIGH (ref 0–44)
AST: 150 U/L — ABNORMAL HIGH (ref 15–41)
Albumin: 1.7 g/dL — ABNORMAL LOW (ref 3.5–5.0)
Alkaline Phosphatase: 175 U/L — ABNORMAL HIGH (ref 38–126)
Anion gap: 9 (ref 5–15)
BUN: 12 mg/dL (ref 6–20)
CO2: 16 mmol/L — ABNORMAL LOW (ref 22–32)
Calcium: 9.1 mg/dL (ref 8.9–10.3)
Chloride: 115 mmol/L — ABNORMAL HIGH (ref 98–111)
Creatinine, Ser: 1.26 mg/dL — ABNORMAL HIGH (ref 0.44–1.00)
GFR calc Af Amer: 58 mL/min — ABNORMAL LOW (ref >60)
GFR calc non Af Amer: 50 mL/min — ABNORMAL LOW (ref >60)
Glucose, Bld: 118 mg/dL — ABNORMAL HIGH (ref 70–99)
Potassium: 3.3 mmol/L — ABNORMAL LOW (ref 3.5–5.1)
Sodium: 140 mmol/L (ref 135–145)
Total Bilirubin: 2 mg/dL — ABNORMAL HIGH (ref 0.3–1.2)
Total Protein: 5 g/dL — ABNORMAL LOW (ref 6.5–8.1)

## 2019-05-10 LAB — RETICULOCYTES
Immature Retic Fract: 18.2 % — ABNORMAL HIGH (ref 2.3–15.9)
RBC.: 3.99 MIL/uL (ref 3.87–5.11)
Retic Count, Absolute: 93 10*3/uL (ref 19.0–186.0)
Retic Ct Pct: 2.3 % (ref 0.4–3.1)

## 2019-05-10 LAB — FERRITIN: Ferritin: 488 ng/mL — ABNORMAL HIGH (ref 11–307)

## 2019-05-10 LAB — MITOCHONDRIAL ANTIBODIES: Mitochondrial M2 Ab, IgG: <20 Units (ref 0.0–20.0)

## 2019-05-10 LAB — FOLATE: Folate: 12.5 ng/mL (ref >5.9)

## 2019-05-10 LAB — ANTI-SMOOTH MUSCLE ANTIBODY, IGG: F-Actin IgG: 6 Units (ref 0–19)

## 2019-05-10 LAB — PHOSPHORUS: Phosphorus: 2.3 mg/dL — ABNORMAL LOW (ref 2.5–4.6)

## 2019-05-10 LAB — VITAMIN B12: Vitamin B-12: 3857 pg/mL — ABNORMAL HIGH (ref 180–914)

## 2019-05-10 LAB — MAGNESIUM: Magnesium: 1.9 mg/dL (ref 1.7–2.4)

## 2019-05-10 LAB — PATHOLOGIST SMEAR REVIEW

## 2019-05-10 MED ORDER — HEPARIN BOLUS VIA INFUSION
2000.0000 [IU] | Freq: Once | INTRAVENOUS | Status: AC
  Administered 2019-05-10: 2000 [IU] via INTRAVENOUS
  Filled 2019-05-10: qty 2000

## 2019-05-10 MED ORDER — SODIUM BICARBONATE-DEXTROSE 150-5 MEQ/L-% IV SOLN
150.0000 meq | INTRAVENOUS | Status: DC
  Administered 2019-05-10 – 2019-05-11 (×4): 150 meq via INTRAVENOUS
  Filled 2019-05-10 (×5): qty 1000

## 2019-05-10 MED ORDER — POTASSIUM CHLORIDE CRYS ER 20 MEQ PO TBCR
40.0000 meq | EXTENDED_RELEASE_TABLET | Freq: Two times a day (BID) | ORAL | Status: AC
  Administered 2019-05-10 (×2): 40 meq via ORAL
  Filled 2019-05-10 (×2): qty 2

## 2019-05-10 MED ORDER — K PHOS MONO-SOD PHOS DI & MONO 155-852-130 MG PO TABS
500.0000 mg | ORAL_TABLET | Freq: Two times a day (BID) | ORAL | Status: AC
  Administered 2019-05-10 (×2): 500 mg via ORAL
  Filled 2019-05-10 (×3): qty 2

## 2019-05-10 NOTE — Progress Notes (Signed)
ANTICOAGULATION + ANTIBIOTIC CONSULT NOTE - Follow Up Consult  Pharmacy Consult for Heparin;  Cefepime Indication: DVT - RUE;  Sepsis coverage  Allergies  Allergen Reactions  . Gabapentin Other (See Comments)    "Made me feel odd, so I stopped taking it"    Patient Measurements: Height: 5\' 3"  (160 cm) Weight: 125 lb 14.1 oz (57.1 kg) IBW/kg (Calculated) : 52.4 Heparin Dosing Weight: 57.1 kg  Vital Signs: Temp: 97.7 F (36.5 C) (03/15 0348) Temp Source: Oral (03/15 0348) BP: 133/89 (03/15 0348) Pulse Rate: 98 (03/15 0348)  Labs: Recent Labs    05/07/19 1043 05/08/19 0357 05/08/19 0935 05/08/19 0935 05/09/19 0433 05/09/19 2313 05/10/19 0343 05/10/19 1030  HGB   < >  --  11.7*   < > 11.4*  --  11.8*  --   HCT   < >  --  36.5  --  34.9*  --  36.7  --   PLT   < >  --  358  --  341  --  304  --   HEPARINUNFRC  --   --   --   --   --  <0.10*  --  <0.10*  CREATININE  --  1.42*  --   --  1.29*  --  1.26*  --    < > = values in this interval not displayed.    Estimated Creatinine Clearance: 45.2 mL/min (A) (by C-G formula based on SCr of 1.26 mg/dL (H)).  Assessment:  48yof with  history of recurrent melanoma with lymph node involvement s/p right axillary lymph node dissection in February. Complains of RUE swelling and pain > duplex positive for RUE DVT on 3/14 and Heparin drip begun.  LE duplex pending.   Heparin level remains undetectable (<0.10) after re-bolus and increase rate to 1100 units/hr overnight. No known infusion problems. CBC stable.   Day # 5 antibiotics. Vanc and Zosyn changed to Cefepime on 3/12.  Dose remains appropriate.  Afebrile, WBC remains elevated. No clear source of infection. Cultures negative to date.  Goal of Therapy:  Heparin level 0.3-0.7 units/ml Monitor platelets by anticoagulation protocol: Yes   Plan:   Heparin 2000 units IV re-bolus  Increase heparin drip to 1300 units/hr  Heparin level ~6 hrs after rate change.  Daily heparin  level and CBC.  Follow up LE duplex.  Continue Cefepime 2gm IV q12hrs.  Follow renal function, culture data, antibiotic plans.  Arty Baumgartner, [place] Phone: [phone] 05/10/2019,11:41 AM

## 2019-05-10 NOTE — Progress Notes (Signed)
ANTICOAGULATION + ANTIBIOTIC CONSULT NOTE - Follow Up Consult  Pharmacy Consult for Heparin Indication: DVT - RUE;  Allergies  Allergen Reactions  . Gabapentin Other (See Comments)    "Made me feel odd, so I stopped taking it"    Patient Measurements: Height: 5\' 3"  (160 cm) Weight: 125 lb 14.1 oz (57.1 kg) IBW/kg (Calculated) : 52.4 Heparin Dosing Weight: 57.1 kg  Vital Signs: Temp: 97.6 F (36.4 C) (03/15 1252) Temp Source: Oral (03/15 1252) BP: 134/84 (03/15 1252) Pulse Rate: 107 (03/15 1252)  Labs: Recent Labs    05/08/19 0357 05/08/19 0935 05/08/19 0935 05/09/19 0433 05/09/19 2313 05/10/19 0343 05/10/19 1030 05/10/19 1844  HGB  --  11.7*   < > 11.4*  --  11.8*  --   --   HCT  --  36.5  --  34.9*  --  36.7  --   --   PLT  --  358  --  341  --  304  --   --   HEPARINUNFRC  --   --   --   --  <0.10*  --  <0.10* 0.13*  CREATININE 1.42*  --   --  1.29*  --  1.26*  --   --    < > = values in this interval not displayed.    Estimated Creatinine Clearance: 45.2 mL/min (A) (by C-G formula based on SCr of 1.26 mg/dL (H)).  Assessment:  48yof with  history of recurrent melanoma with lymph node involvement s/p right axillary lymph node dissection in February. Complains of RUE swelling and pain > duplex positive for RUE DVT on 3/14 and Heparin drip begun.  LE duplex pending.   Heparin level remains subtherapeutic (0.13) after re-bolus and increase rate to 1300 units/hr. No known infusion problems. CBC stable.   Goal of Therapy:  Heparin level 0.3-0.7 units/ml Monitor platelets by anticoagulation protocol: Yes   Plan:   Heparin 2000 units IV re-bolus  Increase heparin drip to 1500 units/hr  Heparin level ~6 hrs after rate change.  Daily heparin level and CBC.   Nicholus Chandran A. Levada Dy, PharmD, BCPS, FNKF Clinical Pharmacist  Please utilize Amion for appropriate phone number to reach the unit pharmacist ([place])   05/10/2019,7:22 PM

## 2019-05-10 NOTE — Progress Notes (Signed)
Bilateral lower extremity venous duplex has been completed. Preliminary results can be found in CV Proc through chart review.   05/10/19 12:09 PM Angelica Dunn RVT

## 2019-05-10 NOTE — Progress Notes (Signed)
Angelica Dunn   DOB:07/09/1970   EX#:[id number]   WNU#:[id number]  Oncology follow up   Subjective: Pt developed worsening right upper extremity edema and pain, Doppler showed DVT.  She still quite fatigued, able to get out of bed and use the bathroom, but fell unsteady, no significant headaches, vision change, or other neurological symptoms.  Her appetite is low, she is afebrile, vital signs stable.   Objective:  Vitals:   05/10/19 1252 05/10/19 2051  BP: 134/84 139/83  Pulse: (!) 107 (!) 122  Resp: 19 20  Temp: 97.6 F (36.4 C) 98.3 F (36.8 C)  SpO2: 100% 100%    Body mass index is 22.3 kg/m.  Intake/Output Summary (Last 24 hours) at 05/10/2019 2109 Last data filed at 05/10/2019 1500 Gross per 24 hour  Intake 2207.68 ml  Output -  Net 2207.68 ml     Sclerae unicteric  Oropharynx clear  Lungs clear -- no rales or rhonchi  Heart regular rate and rhythm  Abdomen benign  (+) Right upper extremity edema, including hand, no edema in left arm or LEs  CBG (last 3)  No results for input(s): GLUCAP in the last 72 hours.   Labs:  Urine Studies No results for input(s): UHGB, CRYS in the last 72 hours.  Invalid input(s): UACOL, UAPR, USPG, UPH, UTP, UGL, UKET, UBIL, UNIT, UROB, ULEU, UEPI, UWBC, URBC, UBAC, CAST, [place], [place]  Basic Metabolic Panel: Recent Labs  Lab 05/07/19 0148 05/07/19 0148 05/07/19 1043 05/07/19 1043 05/07/19 1822 05/07/19 1822 05/08/19 0357 05/08/19 0357 05/09/19 0433 05/10/19 0343  NA 141   < > 142  --  136  --  141  --  139 140  K 3.7   < > 4.1   < > 5.5*   < > 3.0*   < > 3.6 3.3*  CL 108   < > 109  --  106  --  112*  --  114* 115*  CO2 20*   < > 21*  --  21*  --  22  --  17* 16*  GLUCOSE 79   < > 78  --  107*  --  102*  --  85 118*  BUN 24*   < > 24*  --  24*  --  18  --  15 12  CREATININE 1.45*   < > 1.50*  --  1.60*  --  1.42*  --  1.29* 1.26*  CALCIUM 12.5*   < > 13.0*   < > 11.8*  --  11.8*  11.8*  --  9.7 9.1  MG 2.1  --    --   --   --   --  1.7  --  1.9 1.9  PHOS 4.8*  --   --   --   --   --  3.0  --  1.8* 2.3*   < > = values in this interval not displayed.   GFR Estimated Creatinine Clearance: 45.2 mL/min (A) (by C-G formula based on SCr of 1.26 mg/dL (H)). Liver Function Tests: Recent Labs  Lab 05/07/19 1043 05/07/19 1822 05/08/19 0357 05/09/19 0433 05/10/19 0343  AST 219* 176* 87* 109* 150*  ALT 489* 357* 261* 209* 212*  ALKPHOS 176* 169* 150* 154* 175*  BILITOT 2.5* 2.4* 1.5* 1.2 2.0*  PROT 6.2* 5.3* 4.8* 4.8* 5.0*  ALBUMIN 2.5* 2.3* 1.8* 1.7* 1.7*   Recent Labs  Lab 05/07/19 0148  LIPASE 16   No results for input(s): AMMONIA in  the last 168 hours. Coagulation profile Recent Labs  Lab 05/06/19 1056  INR 1.3*    CBC: Recent Labs  Lab 05/06/19 1056 05/06/19 1056 05/07/19 0148 05/07/19 1043 05/08/19 0935 05/09/19 0433 05/10/19 0343  WBC 39.0 Repeated and verified X2.*   < > 34.9* 31.7* 30.5* 29.4* 33.7*  NEUTROABS 35.0*  --  29.8*  --  29.0* 24.3* 26.9*  HGB 14.7   < > 13.2 13.5 11.7* 11.4* 11.8*  HCT 44.3   < > 41.7 41.6 36.5 34.9* 36.7  MCV 88.8   < > 92.5 90.6 93.1 89.7 92.0  PLT 508.0 Repeated and verified X2.*   < > 460* 431* 358 341 304   < > = values in this interval not displayed.   Cardiac Enzymes: No results for input(s): CKTOTAL, CKMB, CKMBINDEX, TROPONINI in the last 168 hours. BNP: Invalid input(s): POCBNP CBG: No results for input(s): GLUCAP in the last 168 hours. D-Dimer No results for input(s): DDIMER in the last 72 hours. Hgb A1c No results for input(s): HGBA1C in the last 72 hours. Lipid Profile No results for input(s): CHOL, HDL, LDLCALC, TRIG, CHOLHDL, LDLDIRECT in the last 72 hours. Thyroid function studies No results for input(s): TSH, T4TOTAL, T3FREE, THYROIDAB in the last 72 hours.  Invalid input(s): FREET3 Anemia work up Recent Labs    05/10/19 0343  VITAMINB12 3,857*  FOLATE 12.5  FERRITIN 488*  TIBC 133*  IRON 27*  RETICCTPCT 2.3    Microbiology Recent Results (from the past 240 hour(s))  Blood culture (routine x 2)     Status: None (Preliminary result)   Collection Time: 05/06/19  9:42 PM   Specimen: BLOOD  Result Value Ref Range Status   Specimen Description BLOOD LEFT ARM  Final   Special Requests   Final    BOTTLES DRAWN AEROBIC AND ANAEROBIC Blood Culture results may not be optimal due to an excessive volume of blood received in culture bottles Performed at [facility] [address], [place], [place] [address]    Culture NO GROWTH 4 DAYS  Final   Report Status PENDING  Incomplete  Respiratory Panel by RT PCR (Flu A&B, Covid) - Nasopharyngeal Swab     Status: None   Collection Time: 05/06/19 10:35 PM   Specimen: Nasopharyngeal Swab  Result Value Ref Range Status   SARS Coronavirus 2 by RT PCR NEGATIVE NEGATIVE Final    Comment: (NOTE) SARS-CoV-2 target nucleic acids are NOT DETECTED. The SARS-CoV-2 RNA is generally detectable in upper respiratoy specimens during the acute phase of infection. The lowest concentration of SARS-CoV-2 viral copies this assay can detect is 131 copies/mL. A negative result does not preclude SARS-Cov-2 infection and should not be used as the sole basis for treatment or other patient management decisions. A negative result may occur with  improper specimen collection/handling, submission of specimen other than nasopharyngeal swab, presence of viral mutation(s) within the areas targeted by this assay, and inadequate number of viral copies (<131 copies/mL). A negative result must be combined with clinical observations, patient history, and epidemiological information. The expected result is Negative. Fact Sheet for Patients:  PinkCheek.be Fact Sheet for Healthcare Providers:  GravelBags.it This test is not yet ap proved or cleared by the [place] FDA and  has been authorized for detection and/or diagnosis of  SARS-CoV-2 by FDA under an Emergency Use Authorization (EUA). This EUA will remain  in effect (meaning this test can be used) for the duration of the COVID-19 declaration under  Section 564(b)(1) of the Act, 21 U.S.C. section 360bbb-3(b)(1), unless the authorization is terminated or revoked sooner.    Influenza A by PCR NEGATIVE NEGATIVE Final   Influenza B by PCR NEGATIVE NEGATIVE Final    Comment: (NOTE) The Xpert Xpress SARS-CoV-2/FLU/RSV assay is intended as an aid in  the diagnosis of influenza from Nasopharyngeal swab specimens and  should not be used as a sole basis for treatment. Nasal washings and  aspirates are unacceptable for Xpert Xpress SARS-CoV-2/FLU/RSV  testing. Fact Sheet for Patients: PinkCheek.be Fact Sheet for Healthcare Providers: GravelBags.it This test is not yet approved or cleared by the [place] FDA and  has been authorized for detection and/or diagnosis of SARS-CoV-2 by  FDA under an Emergency Use Authorization (EUA). This EUA will remain  in effect (meaning this test can be used) for the duration of the  Covid-19 declaration under Section 564(b)(1) of the Act, 21  U.S.C. section 360bbb-3(b)(1), unless the authorization is  terminated or revoked. Performed at [facility], [place] [address] [place], [place] [address]   Culture, Urine     Status: None   Collection Time: 05/07/19  1:21 AM   Specimen: Urine, Random  Result Value Ref Range Status   Specimen Description URINE, RANDOM  Final   Special Requests NONE  Final   Culture   Final    NO GROWTH Performed at [facility], [place] [address] [place], [place] [address]    Report Status 05/08/2019 FINAL  Final  Blood culture (routine x 2)     Status: None (Preliminary result)   Collection Time: 05/07/19  3:13 AM   Specimen: BLOOD  Result Value Ref Range Status   Specimen Description BLOOD LEFT ARM  Final   Special Requests    Final    BOTTLES DRAWN AEROBIC ONLY Blood Culture adequate volume Performed at [facility], [place] [address] [place], [place] [address]    Culture NO GROWTH 3 DAYS  Final   Report Status PENDING  Incomplete  MRSA PCR Screening     Status: None   Collection Time: 05/08/19  5:19 PM   Specimen: Nasopharyngeal  Result Value Ref Range Status   MRSA by PCR NEGATIVE NEGATIVE Final    Comment:        The GeneXpert MRSA Assay (FDA approved for NASAL specimens only), is one component of a comprehensive MRSA colonization surveillance program. It is not intended to diagnose MRSA infection nor to guide or monitor treatment for MRSA infections. Performed at [facility], [place] [address], [place], [place] [address]       Studies:  VAS [place] LOWER EXTREMITY VENOUS (DVT)  Result Date: 05/10/2019  Lower Venous DVTStudy Indications: Swelling.  Risk Factors: None identified. Comparison Study: No prior studies. Performing Technologist: Oliver Hum RVT  Examination Guidelines: A complete evaluation includes B-mode imaging, spectral Doppler, color Doppler, and power Doppler as needed of all accessible portions of each vessel. Bilateral testing is considered an integral part of a complete examination. Limited examinations for reoccurring indications may be performed as noted. The reflux portion of the exam is performed with the patient in reverse Trendelenburg.  +---------+---------------+---------+-----------+----------+--------------+ RIGHT    CompressibilityPhasicitySpontaneityPropertiesThrombus Aging +---------+---------------+---------+-----------+----------+--------------+ CFV      Full           Yes      Yes                                 +---------+---------------+---------+-----------+----------+--------------+  SFJ      Full                                                        +---------+---------------+---------+-----------+----------+--------------+ FV Prox   Full                                                        +---------+---------------+---------+-----------+----------+--------------+ FV Mid   Full                                                        +---------+---------------+---------+-----------+----------+--------------+ FV DistalFull                                                        +---------+---------------+---------+-----------+----------+--------------+ PFV      Full                                                        +---------+---------------+---------+-----------+----------+--------------+ POP      Full           Yes      Yes                                 +---------+---------------+---------+-----------+----------+--------------+ PTV      Full                                                        +---------+---------------+---------+-----------+----------+--------------+ PERO     Full                                                        +---------+---------------+---------+-----------+----------+--------------+   +---------+---------------+---------+-----------+----------+--------------+ LEFT     CompressibilityPhasicitySpontaneityPropertiesThrombus Aging +---------+---------------+---------+-----------+----------+--------------+ CFV      Full           Yes      Yes                                 +---------+---------------+---------+-----------+----------+--------------+ SFJ      Full                                                        +---------+---------------+---------+-----------+----------+--------------+  FV Prox  Full                                                        +---------+---------------+---------+-----------+----------+--------------+ FV Mid   Full                                                        +---------+---------------+---------+-----------+----------+--------------+ FV DistalFull                                                         +---------+---------------+---------+-----------+----------+--------------+ PFV      Full                                                        +---------+---------------+---------+-----------+----------+--------------+ POP      Full           Yes      Yes                                 +---------+---------------+---------+-----------+----------+--------------+ PTV      Full                                                        +---------+---------------+---------+-----------+----------+--------------+ PERO     Full                                                        +---------+---------------+---------+-----------+----------+--------------+     Summary: RIGHT: - There is no evidence of deep vein thrombosis in the lower extremity.  - No cystic structure found in the popliteal fossa.  LEFT: - There is no evidence of deep vein thrombosis in the lower extremity.  - No cystic structure found in the popliteal fossa.  *See table(s) above for measurements and observations. Electronically signed by Ruta Hinds MD on 05/10/2019 at 7:07:53 PM.    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 Extensive, aggressive and progressive infiltrating tumor in the right axilla. Limitations: Edema, pain with movement of arm. Comparison Study: No prior study on file Performing Technologist: Sharion Dove RVS  Examination Guidelines: A complete evaluation includes B-mode imaging, spectral Doppler, color Doppler, and power Doppler as needed of all accessible portions of each vessel. Bilateral testing is considered an integral part of a complete examination. Limited examinations for reoccurring indications may be performed as  noted.  Right Findings: +----------+------------+---------+-----------+----------+--------------+ RIGHT      CompressiblePhasicitySpontaneousProperties   Summary     +----------+------------+---------+-----------+----------+--------------+ IJV           Full       Yes       Yes                             +----------+------------+---------+-----------+----------+--------------+ Subclavian               Yes       Yes                             +----------+------------+---------+-----------+----------+--------------+ Axillary                 Yes       Yes                             +----------+------------+---------+-----------+----------+--------------+ Brachial    Partial                                     Acute      +----------+------------+---------+-----------+----------+--------------+ Radial        None                                      Acute      +----------+------------+---------+-----------+----------+--------------+ Ulnar         None                                      Acute      +----------+------------+---------+-----------+----------+--------------+ Cephalic                                            Not visualized +----------+------------+---------+-----------+----------+--------------+ Basilic                                             Not visualized +----------+------------+---------+-----------+----------+--------------+  Left Findings: +----------+------------+---------+-----------+----------+-------+ LEFT      CompressiblePhasicitySpontaneousPropertiesSummary +----------+------------+---------+-----------+----------+-------+ Subclavian               Yes       Yes                      +----------+------------+---------+-----------+----------+-------+  Summary:  Right: Findings consistent with acute deep vein thrombosis involving the right brachial veins, right radial veins and right ulnar veins. However, unable to visualize the cephalic and basilic veins. Anechoic area in axillary region of chest. Possible lymph node.   Left: No evidence of thrombosis in the subclavian.  *See table(s) above for measurements and observations.  Diagnosing physician: Ruta Hinds MD Electronically signed by Ruta Hinds MD on 05/10/2019 at 7:16:31 PM.    Final     Assessment: 49 y.o.   1.  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 New RUE DVT 3. 1.  Leukocytosis  4.  Elevated LFTs and total bilirubin 5.  Hypercalcemia, resolved  6.  Hypertension 7.  Hyperlipidemia  Plan:  -her right UE DVT is related to her underlying malignancy, in the setting of right arm lymphedema -She needs lifelong anticoagulation.  I agree with heparin during her hospital stay.  I discussed outpatient management options, including Lovenox injection, Coumadin and direct factor Xa inhibitor. Although Lovenox if prefers in malignancy related DVT, she is not comfortable to do self injections, especially with limited strength of her right hand due to the edema. 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 She can use full dose 67m q12h for 7 days then 511mtwice daily as maintenance therapy as long as her renal function remains stable. -Patient feels unsteady when she walks. Due to high risk of brain metastasis from melanoma, and high risk of bleeding if she does have brain metastasis, I recommend her to get a brain MRI w and wo contrast to rule out brain mets -if all ID work up is negative, I think her leukocytosis could be related to her underlying malignancy.  Due to her rapid gross of her melanoma, she needs start treatment as soon as possible after discharge.  I think is reasonable to discharge her in a few days if she otherwise stable.  -Dr. LeBobby Rumpfill resume care after her discharge, I will inform him when she is ready to leave hospital.     YaTruitt MerleMD 05/10/2019  9:09 PM

## 2019-05-10 NOTE — Consult Note (Signed)
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  She was noted on duplex exam yesterday to have DVT involving the right brachial radial ulnar veins but central veins were not visualized.  Patient has had some swelling in her right arm.  She does not really describe the swelling being worse as the course of the day has proceeded.  She has had some occasional pain in her hand but not really in her forearm or upper arm.  She has been trying to elevate the arm.  She is currently on a heparin drip.  She has no numbness or tingling in her hand.  Motor function is intact.  The arm just feels heavy and full.  She does have some limited function of her shoulder.  I was asked to see the patient for concerns regarding possible compartment syndrome.  Past Medical History:  Diagnosis Date  . No pertinent past medical history     FH:  Non-Contributory  Social HX Social History   Tobacco Use  . Smoking status: Former Research scientist (life sciences)  . Smokeless tobacco: Never Used  Substance Use Topics  . Alcohol use: Not Currently  . Drug use: No    Allergies Allergies  Allergen Reactions  . Gabapentin Other (See Comments)    "Made me feel odd, so I stopped taking it"    Medications Current Facility-Administered Medications  Medication Dose Route Frequency Provider Last Rate Last Admin  . amitriptyline (ELAVIL) tablet 10-20 mg  10-20 mg Oral QHS PRN Dana Allan I, MD      . bisacodyl (DULCOLAX) suppository 10 mg  10 mg Rectal Daily PRN Alfredia Ferguson, Omair Latif, DO      . ceFEPIme (MAXIPIME) 2 g in sodium chloride 0.9 % 100 mL IVPB  2 g Intravenous Q12H Sheikh, Georgina Quint [place], DO   Stopped at 05/10/19 1755  . Chlorhexidine Gluconate Cloth 2 % PADS 6 each  6 each Topical Daily Bonnell Public, MD   6 each at 05/10/19 [phone]   . fentaNYL (SUBLIMAZE) injection 12.5 mcg  12.5 mcg Intravenous Q1H PRN Raiford Noble Latif, DO   12.5 mcg at 05/10/19 0034  . heparin ADULT infusion 100 units/mL (25000 units/213mL sodium chloride 0.45%)  1,300 Units/hr Intravenous Continuous Skeet Simmer, RPH 13 mL/hr at 05/10/19 1144 1,300 Units/hr at 05/10/19 1144  . loratadine (CLARITIN) tablet 10 mg  10 mg Oral Daily Dana Allan I, MD   10 mg at 05/10/19 0952  . oxyCODONE (Oxy IR/ROXICODONE) immediate release tablet 5 mg  5 mg Oral TID PRN Dana Allan I, MD   5 mg at 05/10/19 1234  . phosphorus (K PHOS NEUTRAL) tablet 500 mg  500 mg Oral BID Raiford Noble [place], DO   500 mg at 05/10/19 1230  . polyethylene glycol (MIRALAX / GLYCOLAX) packet 17 g  17 g Oral BID Raiford Noble [place], DO   17 g at 05/10/19 [id number]  . potassium chloride SA (KLOR-CON) CR tablet 40 mEq  40 mEq Oral BID Raiford Noble [place], DO   40 mEq at 05/10/19 [id number]  . senna-docusate (Senokot-S) tablet 1 tablet  1 tablet Oral BID Raiford Noble [place], [place]   1 tablet at 05/10/19 [id number]  . sodium bicarbonate 150 mEq in dextrose 5% 1000 mL infusion  150 mEq Intravenous Continuous Raiford Noble [place], DO 75  mL/hr at 05/10/19 1035 150 mEq at 05/10/19 1035  . sodium bicarbonate tablet 650 mg  650 mg Oral BID Raiford Noble [place], DO   650 mg at 05/10/19 [id number]  . sodium chloride flush (NS) 0.9 % injection 10-40 mL  10-40 mL Intracatheter Q12H Dana Allan I, MD   10 mL at 05/09/19 2059  . sodium chloride flush (NS) 0.9 % injection 10-40 mL  10-40 mL Intracatheter PRN Dana Allan I, MD      . triamcinolone (NASACORT) nasal inhaler 2 spray  2 spray Nasal Daily PRN Bonnell Public, MD        Labs  CBC    Component Value Date/Time   WBC 33.7 (H) 05/10/2019 0343   RBC 3.99 05/10/2019 0343   RBC 3.99 05/10/2019 0343   HGB 11.8 (L) 05/10/2019 0343   HCT 36.7 05/10/2019 0343   PLT 304 05/10/2019 0343   MCV 92.0 05/10/2019 0343   MCH 29.6 05/10/2019 0343   MCHC  32.2 05/10/2019 0343   RDW 13.9 05/10/2019 0343   LYMPHSABS 2.4 05/10/2019 0343   MONOABS 3.5 (H) 05/10/2019 0343   EOSABS 0.2 05/10/2019 0343   BASOSABS 0.2 (H) 05/10/2019 0343   BMET    Component Value Date/Time   NA 140 05/10/2019 0343   K 3.3 (L) 05/10/2019 0343   CL 115 (H) 05/10/2019 0343   CO2 16 (L) 05/10/2019 0343   GLUCOSE 118 (H) 05/10/2019 0343   BUN 12 05/10/2019 0343   CREATININE 1.26 (H) 05/10/2019 0343   CALCIUM 9.1 05/10/2019 0343   CALCIUM 11.8 (H) 05/08/2019 0357   GFRNONAA 50 (L) 05/10/2019 0343   GFRAA 58 (L) 05/10/2019 0343     PHYSICAL EXAM  Vitals:   05/10/19 0348 05/10/19 1252  BP: 133/89 134/84  Pulse: 98 (!) 107  Resp:  19  Temp: 97.7 F (36.5 C) 97.6 F (36.4 C)  SpO2: 100% 100%    General:  WDWN in NAD HENT: WNL Eyes: Pupils equal Pulmonary: normal non-labored breathing , without Rales, rhonchi,  wheezing Cardiac: RRR Vascular Exam/Pulses: 2+ right radial pulse fingers pink warm right forearm compartments soft.  Minimal tenderness on palpation of the forearm and shoulder, motor function of the hand digits and elbow are intact although she has limited function of her right shoulder.  Extremities without ischemic changes, no Gangrene , no cellulitis; no open wounds;  Neuro A&O x 3; good sensation; motion in all extremities  Data: Images of the patient's recent CT scan of the chest were reviewed.  This shows bulky tumor burden in the right axillary region.  There is no obvious obstruction of the arterial structures.  However, there is definitely evidence of lymphatic involvement in this area.  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  Most likely the venous outflow obstruction is extrinsic rather than intrinsic.  No role for lytic therapy in light of her ongoing tumor burden.  She does not currently have a compartment syndrome.  She does have  swelling in her arm.  It is not dramatically worse than earlier today according to the patient.  Plan: Continue elevation of right arm.  Continue heparin drip and eventually transition to lifelong Lovenox.  If continued swelling and develops pain in the forearm numbness tingling in the hand certainly feel free to reconsult [place] for potentially hand surgery for fasciotomies.  However, I do not believe she currently is in need of this.  Findings  discussed with patient emphasized arm elevation to her.  Ruta Hinds @TODAY @ 6:48 PM

## 2019-05-10 NOTE — Progress Notes (Addendum)
PROGRESS NOTE    Angelica Dunn  IRJ:[id number] DOB: January 22, 1971 DOA: 05/06/2019 PCP: Renaldo Reel, PA    Brief Narrative:  HPI per Dr. Dana Allan on 05/06/19 Patient is a 49 year old lady with history of recurrent melanoma with lymph node involvement.  Patient underwent right axillary lymph node dissection on March 31, 2019, as well as, placement of port for possible immunotherapy.  Postop.  Has been complicated by significantly elevated liver enzymes.  Patient was referred to the GI team by the PCP, patient was seen earlier today.  Liver enzymes remain significantly elevated.  Additionally, patient has significant leukocytosis, with WBC of 39,000.  Patient denies fever, chills, headache, neck pain, URI symptoms, chest pain, shortness of breath, GI symptoms or urinary symptoms.  Abdominal ultrasound revealed biliary dilatation with common bile duct measuring 10 mm.  Gallbladder was said to be mildly distended, but otherwise normal.  No gallstones.  Right-sided pleural effusion was incidentally noted, which is said to be new.  Patient will be admitted for further assessment and management.  ED Course: On presentation to the hospital, vitals revealed temperature of 98.5, heart rate of 140 140 bpm, respiratory rate of 15, blood pressure of 110/74 mmHg and O2 sat of 99%.  Patient has been pancultured by the ER providers.  Patient will be started IV vancomycin and Zosyn.  Pertinent labs: CBC reveals WBC of 39, hemoglobin of 14.7, hematocrit of 44.3 with platelet count of 500.  Test revealed ALT of 741, AST of 486, alkaline phosphatase of 225, albumin of 3.2, direct bilirubin of 1.5 with total bilirubin of 2 and total protein of 7.2.  Imaging: independently reviewed.   Review of Systems:  Negative for fever, visual changes, sore throat, rash, new muscle aches, chest pain, SOB, dysuria, bleeding, n/v/abdominal pain.  Patient has right axillary discomfort/pain following lymph node  dissection on March 30, 2019.  **Interim History Patient was still complaining of some right axillary pain that was her main complaint today and states that her arm felt "heavy".  She thinks it is a little bit more swollen and heavier so we will obtain a upper extremity duplex to rule out DVT as it did look a little bit more swollen.  The ultrasound did show an acute DVT involving the right brachial veins, right radial veins and the right ulnar veins and they were unable to visualize the cephalic and basilic veins.  She was started on anticoagulation with heparin drip and I have asked oncology for to weigh in on anticoagulation recommendations.  Patient was happy to learn that her LFTs are trending down along with her bilirubin slightly and increased from yesterday to today.  She was started on IV fluids and diet given that no ERCP will be done .  General Surgery recommends continuing IV antibiotics at this time empirically I have stopped her vancomycin given her negative MRSA PCR.  Gastroenterology is now signed off as they felt that her elevated LFTs were secondary to drug-induced liver injury question if this is secondary to autoimmune parameters given that she has a positive ANA.  Yesterday the patient has also been complaining about some constipation and had a small bowel movement but will continue stool softeners and if necessary will try an enema.  Because her legs were somewhat swollen we also obtained a venous duplex which did not show any evidence of a DVT bilaterally.  Her leg swelling is secondary to her poor nutritional status and hypoalbuminemia so nutritionist has been consulted for  further evaluation.  Assessment & Plan:   Active Problems:   Leukocytosis   Melanoma of skin (HCC)  Leukocytosis with SIRS-like picture, present on admission, slowly trending down -Admitted patient to inpatient MedSurg -Patient was tachycardic and has a leukocytosis but currently has no source of  infection -WBC was close to 40,000 on admission is now trending downwards and WBC is now 29,400 -Panculture patient and obtain blood cultures, urine culture, urinalysis as well as a respiratory virus panel and mono screen -MRI of the abdomen as below -Broad-spectrum antibiotics changed to IV Vancomycin/IV Cefepime now; I stopped the patient's vancomycin as she is MRSA PCR negative and will continue empiric cefepime for now -General surgery recommending just IV antibiotics -I have changed her normal saline to sodium bicarbonate given her metabolic acidosis with 078 mEq at a rate of 75 MLS per hour -Follow cultures -Procalcitonin level is 1.50 -Repeat CBC in the AM -Recently was slowly improving and went from 29.4 but when up again is now 33.7 -She is afebrile -Obtain documentation from patient's oncologist, Dr. Bobby Rumpf, based in [place], [facility]). -Further management depend on hospital course.  Recurrent Melanoma with Metastatic Lymph node involvement associated with malaise and fatigue -Patient is status post lymph node dissection. -She has had 7 out of 19 lymph nodes positive for metastatic melanoma and breast lesion was complex sclerosing lesion -He has a port in place but has not started immunotherapy due to her abnormalities -Immunotherapy is currently on hold due to abnormal liver function tests and will be starting outpatient -GI team is managing abnormal LFTs and recommends continue to monitor for now and no ERCP. -Patient is known to an oncologist based [place], [place] have consulted medical oncology for further evaluation recommendations and they feel that no inpatient treatment is recommended at this time -CT of the chest PE was done and showed "No CT findings for pulmonary embolism. Extensive, aggressive and progressive infiltrating tumor in the right axilla. This is invading the right chest wall and involving the right chest wall musculature as above. No  mediastinal or hilar mass or adenopathy.  New small right pleural effusion with overlying atelectasis. No definite metastatic pulmonary nodules." -Has worsening Right Arm Swelling so will obtain a RUQ Venous Duplex and it showed an acute right arm DVT -Patient will receive Keytruda in outpatient setting -We will continue to monitor carefully and appreciate further oncology recommendations  Right axillary pain secondary to above and possibly secondary to DVT now -We will oxycodone and have added IV fentanyl -Received IV morphine in the ED yesterday -Continue to adjust pain regimen; patient states that her pain is somewhat controlled with the fentanyl but does not last very long so increased Fentanyl to every 1 hours -Checked Right upper quadrant venous duplex and showed an acute DVT -Consulted Vascular Surgery given worsening Arm Swelling and "Tightness"; Want to r/o Compartment Syndrome and appreciate Dr. Nona Dell evaluation   Acute Right Arm DVT -The ultrasound did show an acute DVT involving the right brachial veins, right radial veins and the right ulnar veins and they were unable to visualize the cephalic and basilic veins.   -She was started on anticoagulation with heparin drip and I have asked oncology for to weigh in on anticoagulation recommendations. -Dr. Burr Medico is to weigh in again later this afternoon -May consider further imaging of the arm  Renal insufficiency and question if this is chronic kidney disease Metabolic Acidosis -BUN/creatinine is now is now 12/1.26 -Started IV fluid hydration  as above and changed to sodium bicarbonate 150 mEQ at 75 mL/hr -Patient's CO2 was 21, chloride level was 109, anion gap was 12; Today her CO2 is 16, chloride level is 115, and anion gap is 9 -Continue with IV fluid hydration as below and will also start sodium bicarbonate tablets 650 mg p.o. twice daily x2 doses but will change to IV sodium bicarbonate -Avoid nephrotoxic medications, contrast  dyes, hypotension and I have stopped her lisinopril and will change her IV Zosyn to IV cefepime -Renally adjust medications -Continue monitor trend liver function panel -Repeat CMP in a.m.  Hypercalcemia, likely of malignancy, improving -Likely malignancy in the setting of melanoma -Started on normal saline at 75 MLS per hour and will change to sodium bicarb and 150 mEq at 75 mL's per hour -Calcium went from 13.0 yesterday and is now 9.1 this morning -Hematology/Oncology consulted for further evaluation and will check PTHrP pending and PTH was 16 -We will give Zometa 4 mg IV x1  -Continue monitor and trend calcium level -Repeat CMP in a.m.  Thrombocytosis, improved -Trending downwards -Patient's platelet count went from 508,000 -> 431,000 -> 358,000 -> 341,000 -> 304,000 -Continue to Monitor and Trend and Monitor for S/Sx of Bleeding  -Repeat CBC in AM   Abnormal LFTs and ? Autoimmune Hepatitis, improving  Hyperbilirubinemia, improving -Trending down now and went from an AST of 486 down to 150 today (trended down is now trending back up), and an ALT of 741 is now 212 slightly higher than yesterday was 209; Was higher on 3/4 -Her EBV IgG was 409 and IgM was less than 36.0 and an EBV NA IgG was 51.8 -T bili was now trending down and went from 2.5 is now 1.2 but now trended back up again and is 2.0 -Alk phos went from 176 down to 150 and is now 175 -MRI/MRCP of the abdomen showed "No evidence of biliary obstruction or choledocholithiasis. No evidence of hepatic abscess or mass. Small right pleural effusion, and right chest and abdominal wall edema. Probable postop seroma in the right breast." -Mono screen is negative -Alpha-1 antitrypsin serum level was greater than 300 -Ceruloplasmin was 44  -Continue to monitor LFTs and trend -GI recommending no plans for ERCP and recommends continue empiric IV antibiotics and is waiting other viral hepatology's and have added an ANA as well as a CMV  IgM.;  GI felt this was felt drug-induced liver injury but ? If this is Autoimmune -Patient's ANA was positive and her ANA pattern was cytoplasmic fluorescence for hep to slide an ANA titer was 1:80 -Will need to discuss with GI further I spoke with Dr. Ardis Hughs who recommends outpatient follow-up in adding a anti-smooth muscle antibody as well as a antimitochondrial antibody  -HCV quantitative log was less than 1.18 and HCV RNA PCR was less than 15 -Continue to follow Hepatic Fxn Panel and GI recommendations  Hypokalemia  -Patient's potassium this morning was 3.3 -Replete with p.o. potassium chloride 40 mEq twice daily x2 doses and with p.o. K-Phos Neutral 500 mg p.o. twice daily x2 doses -Continue monitor and replete as necessary -Repeat CMP in the a.m.  Hypophosphatemia   -Patient's phosphorus level was 2.3 -Replete with p.o. K-Phos neutral 500 mg twice daily x2 -Continue To monitor and replete as necessary -Repeat phosphorus level in the a.m.  Normocytic Anemia -Patient's Hgb/Hct went from 13.5/41.6 -> 11.7/36.5 -> 11.4/34.9 -> 11.8/36.7 -Checked Anemia Panel and iron level was 27, U IBC is 106, TIBC is 133,  saturation ratios of 20%, ferritin level was 488, folate levels 12.5, and vitamin B12 level is 3857 -Continue to Monitor for S/Sx of Bleeding; Currently no overt bleeding noted -Repeat CBC in AM   Constipation -Start bowel regimen with senna docusate 1 tab p.o. twice daily, MiraLAX 17 g p.o. twice daily and bisacodyl 10 mg rectal suppository daily as needed for moderate constipation -Continue to monitor and if not improving will try an enema but will for now hold off on enema today  Poor Po Intake/Low Albumin  -Consult Nutrition for further evaluation recommendations  DVT prophylaxis: SCDs Code Status: FULL CODE  Family Communication: No family present at bedside  Disposition Plan: Patient is from home and now has significant leukocytosis and malaise and will need pending  further work-up of her leukocytosis and improvement of LFTs and clearance by GI, general surgery as well as hematology oncology   Consultants:   Gastroenterology  General Surgery  Medical Oncology   Vascular Surgery   Procedures: MRCP   Antimicrobials:  Anti-infectives (From admission, onward)   Start     Dose/Rate Route Frequency Ordered Stop   05/07/19 2200  vancomycin (VANCOREADY) IVPB 750 mg/150 mL  Status:  Discontinued     750 mg 150 mL/hr over 60 Minutes Intravenous Every 24 hours 05/07/19 0410 05/09/19 0938   05/07/19 1330  ceFEPIme (MAXIPIME) 2 g in sodium chloride 0.9 % 100 mL IVPB     2 g 200 mL/hr over 30 Minutes Intravenous Every 12 hours 05/07/19 1239     05/07/19 0600  piperacillin-tazobactam (ZOSYN) IVPB 3.375 g  Status:  Discontinued     3.375 g 12.5 mL/hr over 240 Minutes Intravenous Every 8 hours 05/07/19 0410 05/07/19 1238   05/06/19 2145  piperacillin-tazobactam (ZOSYN) IVPB 3.375 g     3.375 g 100 mL/hr over 30 Minutes Intravenous  Once 05/06/19 2144 05/06/19 2232   05/06/19 2145  vancomycin (VANCOREADY) IVPB 1250 mg/250 mL     1,250 mg 166.7 mL/hr over 90 Minutes Intravenous  Once 05/06/19 2144 05/07/19 0004     Subjective: Patient was seen and examined at bedside and she continues to complain of some right axillary pain and states that her arm feels more swollen and hand more swollen.  Continues to have pain there.  States that she does not have a very good appetite but has no nausea or vomiting.  No other concerns or complaints at this time and just feels very fatigued and tired.  Objective: Vitals:   05/09/19 1359 05/09/19 2030 05/10/19 0348 05/10/19 1252  BP: 133/89 (!) 143/85 133/89 134/84  Pulse: 100 (!) 109 98 (!) 107  Resp: '18 18  19  '$ Temp: 98.6 F (37 C) 98.2 F (36.8 C) 97.7 F (36.5 C) 97.6 F (36.4 C)  TempSrc: Oral Oral Oral Oral  SpO2: 100% 100% 100% 100%  Weight:      Height:        Intake/Output Summary (Last 24 hours) at  05/10/2019 1644 Last data filed at 05/10/2019 1500 Gross per 24 hour  Intake 3154.68 ml  Output --  Net 3154.68 ml   Filed Weights   05/07/19 0107  Weight: 57.1 kg   Examination: Physical Exam:  Constitutional: Thin Caucasian female currently no acute distress appears uncomfortable again secondary to pain and thinks that her hand is more swollen today and tighter Eyes: Lids and conjunctivae normal, sclerae anicteric  ENMT: External Ears, Nose appear normal. Grossly normal hearing.  Neck: Appears normal, supple, no  cervical masses, normal ROM, no appreciable thyromegaly; no JVD Respiratory: Slightly diminished to auscultation bilaterally, no wheezing, rales, rhonchi or crackles. Normal respiratory effort and patient is not tachypenic. No accessory muscle use.  She has unlabored breathing Cardiovascular: Slightly tachycardic rate but regular rhythm, no murmurs / rubs / gallops. S1 and S2 auscultated.  Has bilateral lower extremity 1+ extremity edema and her right upper arm is also swollen and tender today.  Has a good radial pulse in the right despite her swelling Abdomen: Soft, non-tender, nondistended secondary body habitus. Bowel sounds positive x4.  GU: Deferred. Musculoskeletal: No clubbing / cyanosis of digits/nails.  Right arm is more swollen compared to left and she has induration in the right axilla and a lesion Skin: Again as above patient right arm seems more swollen than and it did yesterday and she complains of being tighter Neurologic: CN 2-12 grossly intact with no focal deficits. Romberg sign and cerebellar reflexes not assessed.  Psychiatric: Normal judgment and insight. Alert and oriented x 3. Normal mood and appropriate affect.    Data Reviewed: I have personally reviewed following labs and imaging studies  CBC: Recent Labs  Lab 05/06/19 1056 05/06/19 1056 05/07/19 0148 05/07/19 1043 05/08/19 0935 05/09/19 0433 05/10/19 0343  WBC 39.0 Repeated and verified X2.*    < > 34.9* 31.7* 30.5* 29.4* 33.7*  NEUTROABS 35.0*  --  29.8*  --  29.0* 24.3* 26.9*  HGB 14.7   < > 13.2 13.5 11.7* 11.4* 11.8*  HCT 44.3   < > 41.7 41.6 36.5 34.9* 36.7  MCV 88.8   < > 92.5 90.6 93.1 89.7 92.0  PLT 508.0 Repeated and verified X2.*   < > 460* 431* 358 341 304   < > = values in this interval not displayed.   Basic Metabolic Panel: Recent Labs  Lab 05/07/19 0148 05/07/19 0148 05/07/19 1043 05/07/19 1043 05/07/19 1822 05/08/19 0357 05/09/19 0433 05/10/19 0343  NA 141   < > 142  --  136 141 139 140  K 3.7   < > 4.1  --  5.5* 3.0* 3.6 3.3*  CL 108   < > 109  --  106 112* 114* 115*  CO2 20*   < > 21*  --  21* 22 17* 16*  GLUCOSE 79   < > 78  --  107* 102* 85 118*  BUN 24*   < > 24*  --  24* '18 15 12  '$ CREATININE 1.45*   < > 1.50*  --  1.60* 1.42* 1.29* 1.26*  CALCIUM 12.5*   < > 13.0*   < > 11.8* 11.8*  11.8* 9.7 9.1  MG 2.1  --   --   --   --  1.7 1.9 1.9  PHOS 4.8*  --   --   --   --  3.0 1.8* 2.3*   < > = values in this interval not displayed.   GFR: Estimated Creatinine Clearance: 45.2 mL/min (A) (by C-G formula based on SCr of 1.26 mg/dL (H)). Liver Function Tests: Recent Labs  Lab 05/07/19 1043 05/07/19 1822 05/08/19 0357 05/09/19 0433 05/10/19 0343  AST 219* 176* 87* 109* 150*  ALT 489* 357* 261* 209* 212*  ALKPHOS 176* 169* 150* 154* 175*  BILITOT 2.5* 2.4* 1.5* 1.2 2.0*  PROT 6.2* 5.3* 4.8* 4.8* 5.0*  ALBUMIN 2.5* 2.3* 1.8* 1.7* 1.7*   Recent Labs  Lab 05/07/19 0148  LIPASE 16   No results for input(s): AMMONIA in the  last 168 hours. Coagulation Profile: Recent Labs  Lab 05/06/19 1056  INR 1.3*   Cardiac Enzymes: No results for input(s): CKTOTAL, CKMB, CKMBINDEX, TROPONINI in the last 168 hours. BNP (last 3 results) No results for input(s): PROBNP in the last 8760 hours. HbA1C: No results for input(s): HGBA1C in the last 72 hours. CBG: No results for input(s): GLUCAP in the last 168 hours. Lipid Profile: No results for  input(s): CHOL, HDL, LDLCALC, TRIG, CHOLHDL, LDLDIRECT in the last 72 hours. Thyroid Function Tests: No results for input(s): TSH, T4TOTAL, FREET4, T3FREE, THYROIDAB in the last 72 hours. Anemia Panel: Recent Labs    05/10/19 0343  VITAMINB12 3,857*  FOLATE 12.5  FERRITIN 488*  TIBC 133*  IRON 27*  RETICCTPCT 2.3   Sepsis Labs: Recent Labs  Lab 05/07/19 0148  PROCALCITON 1.50    Recent Results (from the past 240 hour(s))  Blood culture (routine x 2)     Status: None (Preliminary result)   Collection Time: 05/06/19  9:42 PM   Specimen: BLOOD  Result Value Ref Range Status   Specimen Description BLOOD LEFT ARM  Final   Special Requests   Final    BOTTLES DRAWN AEROBIC AND ANAEROBIC Blood Culture results may not be optimal due to an excessive volume of blood received in culture bottles Performed at [facility] [address] [place], [place] [address]    Culture NO GROWTH 4 DAYS  Final   Report Status PENDING  Incomplete  Respiratory Panel by RT PCR (Flu A&B, Covid) - Nasopharyngeal Swab     Status: None   Collection Time: 05/06/19 10:35 PM   Specimen: Nasopharyngeal Swab  Result Value Ref Range Status   SARS Coronavirus 2 by RT PCR NEGATIVE NEGATIVE Final    Comment: (NOTE) SARS-CoV-2 target nucleic acids are NOT DETECTED. The SARS-CoV-2 RNA is generally detectable in upper respiratoy specimens during the acute phase of infection. The lowest concentration of SARS-CoV-2 viral copies this assay can detect is 131 copies/mL. A negative result does not preclude SARS-Cov-2 infection and should not be used as the sole basis for treatment or other patient management decisions. A negative result may occur with  improper specimen collection/handling, submission of specimen other than nasopharyngeal swab, presence of viral mutation(s) within the areas targeted by this assay, and inadequate number of viral copies (<131 copies/mL). A negative result must be combined with  clinical observations, patient history, and epidemiological information. The expected result is Negative. Fact Sheet for Patients:  PinkCheek.be Fact Sheet for Healthcare Providers:  GravelBags.it This test is not yet ap proved or cleared by the [place] FDA and  has been authorized for detection and/or diagnosis of SARS-CoV-2 by FDA under an Emergency Use Authorization (EUA). This EUA will remain  in effect (meaning this test can be used) for the duration of the COVID-19 declaration under Section 564(b)(1) of the Act, 21 U.S.C. section 360bbb-3(b)(1), unless the authorization is terminated or revoked sooner.    Influenza A by PCR NEGATIVE NEGATIVE Final   Influenza B by PCR NEGATIVE NEGATIVE Final    Comment: (NOTE) The Xpert Xpress SARS-CoV-2/FLU/RSV assay is intended as an aid in  the diagnosis of influenza from Nasopharyngeal swab specimens and  should not be used as a sole basis for treatment. Nasal washings and  aspirates are unacceptable for Xpert Xpress SARS-CoV-2/FLU/RSV  testing. Fact Sheet for Patients: PinkCheek.be Fact Sheet for Healthcare Providers: GravelBags.it This test is not yet approved or cleared by the [place]  States FDA and  has been authorized for detection and/or diagnosis of SARS-CoV-2 by  FDA under an Emergency Use Authorization (EUA). This EUA will remain  in effect (meaning this test can be used) for the duration of the  Covid-19 declaration under Section 564(b)(1) of the Act, 21  U.S.C. section 360bbb-3(b)(1), unless the authorization is  terminated or revoked. Performed at [facility], [place] [address] [place], [place] [address]   Culture, Urine     Status: None   Collection Time: 05/07/19  1:21 AM   Specimen: Urine, Random  Result Value Ref Range Status   Specimen Description URINE, RANDOM  Final   Special Requests NONE   Final   Culture   Final    NO GROWTH Performed at [facility], [place] [address] [place], [place] [address]    Report Status 05/08/2019 FINAL  Final  Blood culture (routine x 2)     Status: None (Preliminary result)   Collection Time: 05/07/19  3:13 AM   Specimen: BLOOD  Result Value Ref Range Status   Specimen Description BLOOD LEFT ARM  Final   Special Requests   Final    BOTTLES DRAWN AEROBIC ONLY Blood Culture adequate volume Performed at [facility], [place] [address] [place], [place] [address]    Culture NO GROWTH 3 DAYS  Final   Report Status PENDING  Incomplete  MRSA PCR Screening     Status: None   Collection Time: 05/08/19  5:19 PM   Specimen: Nasopharyngeal  Result Value Ref Range Status   MRSA by PCR NEGATIVE NEGATIVE Final    Comment:        The GeneXpert MRSA Assay (FDA approved for NASAL specimens only), is one component of a comprehensive MRSA colonization surveillance program. It is not intended to diagnose MRSA infection nor to guide or monitor treatment for MRSA infections. Performed at [facility], [place] [address], [place], [place] [address]      RN Pressure Injury Documentation:     Estimated body mass index is 22.3 kg/m as calculated from the following:   Height as of this encounter: '5\' 3"'$  (1.6 m).   Weight as of this encounter: 57.1 kg.  Malnutrition Type:      Malnutrition Characteristics:      Nutrition Interventions:     Radiology Studies: VAS [place] LOWER EXTREMITY VENOUS (DVT)  Result Date: 05/10/2019  Lower Venous DVTStudy Indications: Swelling.  Risk Factors: None identified. Comparison Study: No prior studies. Performing Technologist: Oliver Hum RVT  Examination Guidelines: A complete evaluation includes B-mode imaging, spectral Doppler, color Doppler, and power Doppler as needed of all accessible portions of each vessel. Bilateral testing is considered an integral part of a complete examination. Limited  examinations for reoccurring indications may be performed as noted. The reflux portion of the exam is performed with the patient in reverse Trendelenburg.  +---------+---------------+---------+-----------+----------+--------------+ RIGHT    CompressibilityPhasicitySpontaneityPropertiesThrombus Aging +---------+---------------+---------+-----------+----------+--------------+ CFV      Full           Yes      Yes                                 +---------+---------------+---------+-----------+----------+--------------+ SFJ      Full                                                        +---------+---------------+---------+-----------+----------+--------------+  FV Prox  Full                                                        +---------+---------------+---------+-----------+----------+--------------+ FV Mid   Full                                                        +---------+---------------+---------+-----------+----------+--------------+ FV DistalFull                                                        +---------+---------------+---------+-----------+----------+--------------+ PFV      Full                                                        +---------+---------------+---------+-----------+----------+--------------+ POP      Full           Yes      Yes                                 +---------+---------------+---------+-----------+----------+--------------+ PTV      Full                                                        +---------+---------------+---------+-----------+----------+--------------+ PERO     Full                                                        +---------+---------------+---------+-----------+----------+--------------+   +---------+---------------+---------+-----------+----------+--------------+ LEFT     CompressibilityPhasicitySpontaneityPropertiesThrombus Aging  +---------+---------------+---------+-----------+----------+--------------+ CFV      Full           Yes      Yes                                 +---------+---------------+---------+-----------+----------+--------------+ SFJ      Full                                                        +---------+---------------+---------+-----------+----------+--------------+ FV Prox  Full                                                        +---------+---------------+---------+-----------+----------+--------------+  FV Mid   Full                                                        +---------+---------------+---------+-----------+----------+--------------+ FV DistalFull                                                        +---------+---------------+---------+-----------+----------+--------------+ PFV      Full                                                        +---------+---------------+---------+-----------+----------+--------------+ POP      Full           Yes      Yes                                 +---------+---------------+---------+-----------+----------+--------------+ PTV      Full                                                        +---------+---------------+---------+-----------+----------+--------------+ PERO     Full                                                        +---------+---------------+---------+-----------+----------+--------------+     Summary: RIGHT: - There is no evidence of deep vein thrombosis in the lower extremity.  - No cystic structure found in the popliteal fossa.  LEFT: - There is no evidence of deep vein thrombosis in the lower extremity.  - No cystic structure found in the popliteal fossa.  *See table(s) above for measurements and observations.    Preliminary    VAS [place] UPPER EXTREMITY VENOUS DUPLEX  Result Date: 05/09/2019 UPPER VENOUS STUDY  Indications: Edema Risk Factors: Cancer Metastatic melanoma.  Extensive, aggressive and progressive infiltrating tumor in the right axilla. Limitations: Edema, pain with movement of arm. Comparison Study: No prior study on file Performing Technologist: Sharion Dove RVS  Examination Guidelines: A complete evaluation includes B-mode imaging, spectral Doppler, color Doppler, and power Doppler as needed of all accessible portions of each vessel. Bilateral testing is considered an integral part of a complete examination. Limited examinations for reoccurring indications may be performed as noted.  Right Findings: +----------+------------+---------+-----------+----------+--------------+ RIGHT     CompressiblePhasicitySpontaneousProperties   Summary     +----------+------------+---------+-----------+----------+--------------+ IJV           Full       Yes       Yes                             +----------+------------+---------+-----------+----------+--------------+  Subclavian               Yes       Yes                             +----------+------------+---------+-----------+----------+--------------+ Axillary                 Yes       Yes                             +----------+------------+---------+-----------+----------+--------------+ Brachial    Partial                                     Acute      +----------+------------+---------+-----------+----------+--------------+ Radial        None                                      Acute      +----------+------------+---------+-----------+----------+--------------+ Ulnar         None                                      Acute      +----------+------------+---------+-----------+----------+--------------+ Cephalic                                            Not visualized +----------+------------+---------+-----------+----------+--------------+ Basilic                                             Not visualized  +----------+------------+---------+-----------+----------+--------------+  Left Findings: +----------+------------+---------+-----------+----------+-------+ LEFT      CompressiblePhasicitySpontaneousPropertiesSummary +----------+------------+---------+-----------+----------+-------+ Subclavian               Yes       Yes                      +----------+------------+---------+-----------+----------+-------+  Summary:  Right: Findings consistent with acute deep vein thrombosis involving the right brachial veins, right radial veins and right ulnar veins. However, unable to visualize the cephalic and basilic veins. Anechoic area in axillary region of chest. Possible lymph node.  Left: No evidence of thrombosis in the subclavian.  *See table(s) above for measurements and observations.    Preliminary    Scheduled Meds: . Chlorhexidine Gluconate Cloth  6 each Topical Daily  . loratadine  10 mg Oral Daily  . phosphorus  500 mg Oral BID  . polyethylene glycol  17 g Oral BID  . potassium chloride  40 mEq Oral BID  . senna-docusate  1 tablet Oral BID  . sodium bicarbonate  650 mg Oral BID  . sodium chloride flush  10-40 mL Intracatheter Q12H   Continuous Infusions: . ceFEPime (MAXIPIME) IV 2 g (05/10/19 0954)  . heparin 1,300 Units/hr (05/10/19 1144)  . sodium bicarbonate 150 mEq in dextrose 5% 1000 mL 150 mEq (05/10/19 1035)    LOS: 4 days   Kerney Elbe, DO [facility]  PAGER is on AMION  If 7PM-7AM, please contact night-coverage [email or web address]

## 2019-05-10 NOTE — Progress Notes (Signed)
[place] for Heparin Indication: DVT - RUE  Allergies  Allergen Reactions  . Gabapentin Other (See Comments)    "Made me feel odd, so I stopped taking it"    Patient Measurements: Height: 5\' 3"  (160 cm) Weight: 125 lb 14.1 oz (57.1 kg) IBW/kg (Calculated) : 52.4   Vital Signs: Temp: 98.2 F (36.8 C) (03/14 2030) Temp Source: Oral (03/14 2030) BP: 143/85 (03/14 2030) Pulse Rate: 109 (03/14 2030)  Labs: Recent Labs    05/07/19 1043 05/07/19 1043 05/07/19 1822 05/08/19 0357 05/08/19 0935 05/09/19 0433 05/09/19 2313  HGB 13.5   < >  --   --  11.7* 11.4*  --   HCT 41.6  --   --   --  36.5 34.9*  --   PLT 431*  --   --   --  358 341  --   HEPARINUNFRC  --   --   --   --   --   --  <0.10*  CREATININE 1.50*   < > 1.60* 1.42*  --  1.29*  --    < > = values in this interval not displayed.    Estimated Creatinine Clearance: 44.1 mL/min (A) (by C-G formula based on SCr of 1.29 mg/dL (H)).   Medical History: Past Medical History:  Diagnosis Date  . 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 Complains of RUE swelling and pain + VTE - will being  Heparin drip.   3/15 AM update:  Heparin level low No issues per RN  Goal of Therapy:  Heparin level 0.3-0.7 units/ml Monitor platelets by anticoagulation protocol: Yes   Plan:  Heparin bolus 2000 units x 1 Heparin drip to 1100 units/hr 0900 heparin level Monitor s/s bleeding   Narda Bonds, PharmD, BCPS Clinical Pharmacist Phone: [phone]

## 2019-05-11 ENCOUNTER — Inpatient Hospital Stay (HOSPITAL_COMMUNITY): Payer: Managed Care, Other (non HMO)

## 2019-05-11 LAB — ANA: Anti Nuclear Antibody (ANA): POSITIVE — AB

## 2019-05-11 LAB — ALPHA-1-ANTITRYPSIN: A-1 Antitrypsin, Ser: >300 mg/dL — ABNORMAL HIGH (ref 83–199)

## 2019-05-11 LAB — CBC
HCT: 33.8 % — ABNORMAL LOW (ref 36.0–46.0)
Hemoglobin: 11 g/dL — ABNORMAL LOW (ref 12.0–15.0)
MCH: 29.6 pg (ref 26.0–34.0)
MCHC: 32.5 g/dL (ref 30.0–36.0)
MCV: 91.1 fL (ref 80.0–100.0)
Platelets: 272 10*3/uL (ref 150–400)
RBC: 3.71 MIL/uL — ABNORMAL LOW (ref 3.87–5.11)
RDW: 14.3 % (ref 11.5–15.5)
WBC: 33.2 10*3/uL — ABNORMAL HIGH (ref 4.0–10.5)
nRBC: 0 % (ref 0.0–0.2)

## 2019-05-11 LAB — COMPREHENSIVE METABOLIC PANEL
ALT: 239 U/L — ABNORMAL HIGH (ref 0–44)
AST: 214 U/L — ABNORMAL HIGH (ref 15–41)
Albumin: 1.5 g/dL — ABNORMAL LOW (ref 3.5–5.0)
Alkaline Phosphatase: 173 U/L — ABNORMAL HIGH (ref 38–126)
Anion gap: 9 (ref 5–15)
BUN: 9 mg/dL (ref 6–20)
CO2: 19 mmol/L — ABNORMAL LOW (ref 22–32)
Calcium: 8.8 mg/dL — ABNORMAL LOW (ref 8.9–10.3)
Chloride: 114 mmol/L — ABNORMAL HIGH (ref 98–111)
Creatinine, Ser: 1.33 mg/dL — ABNORMAL HIGH (ref 0.44–1.00)
GFR calc Af Amer: 55 mL/min — ABNORMAL LOW (ref >60)
GFR calc non Af Amer: 47 mL/min — ABNORMAL LOW (ref >60)
Glucose, Bld: 146 mg/dL — ABNORMAL HIGH (ref 70–99)
Potassium: 2.7 mmol/L — CL (ref 3.5–5.1)
Sodium: 142 mmol/L (ref 135–145)
Total Bilirubin: 2.2 mg/dL — ABNORMAL HIGH (ref 0.3–1.2)
Total Protein: 4.8 g/dL — ABNORMAL LOW (ref 6.5–8.1)

## 2019-05-11 LAB — ANTI-NUCLEAR AB-TITER (ANA TITER): ANA Titer 1: 1:80 {titer} — ABNORMAL HIGH

## 2019-05-11 LAB — CULTURE, BLOOD (ROUTINE X 2): Culture: NO GROWTH

## 2019-05-11 LAB — IGG: IgG (Immunoglobin G), Serum: 1437 mg/dL (ref 600–1640)

## 2019-05-11 LAB — EPSTEIN-BARR VIRUS VCA ANTIBODY PANEL
EBV NA IgG: 51.8 U/mL — ABNORMAL HIGH
EBV VCA IgG: 409 U/mL — ABNORMAL HIGH
EBV VCA IgM: <36 U/mL

## 2019-05-11 LAB — HEPARIN LEVEL (UNFRACTIONATED)
Heparin Unfractionated: 0.14 IU/mL — ABNORMAL LOW (ref 0.30–0.70)
Heparin Unfractionated: 0.13 IU/mL — ABNORMAL LOW (ref 0.30–0.70)
Heparin Unfractionated: 0.25 IU/mL — ABNORMAL LOW (ref 0.30–0.70)

## 2019-05-11 LAB — HEPATITIS C RNA QUANTITATIVE
HCV Quantitative Log: <1.18 Log IU/mL
HCV RNA, PCR, QN: <15 IU/mL

## 2019-05-11 LAB — PHOSPHORUS: Phosphorus: 1.7 mg/dL — ABNORMAL LOW (ref 2.5–4.6)

## 2019-05-11 LAB — CERULOPLASMIN: Ceruloplasmin: 44 mg/dL (ref 18–53)

## 2019-05-11 LAB — ANTI-SMOOTH MUSCLE ANTIBODY, IGG: Actin (Smooth Muscle) Antibody (IGG): <20 U (ref <20)

## 2019-05-11 LAB — MAGNESIUM: Magnesium: 1.9 mg/dL (ref 1.7–2.4)

## 2019-05-11 LAB — MITOCHONDRIAL ANTIBODIES: Mitochondrial M2 Ab, IgG: <=20 U

## 2019-05-11 MED ORDER — HEPARIN BOLUS VIA INFUSION
2000.0000 [IU] | Freq: Once | INTRAVENOUS | Status: AC
  Administered 2019-05-11: 2000 [IU] via INTRAVENOUS
  Filled 2019-05-11: qty 2000

## 2019-05-11 MED ORDER — POTASSIUM PHOSPHATES 15 MMOLE/5ML IV SOLN
30.0000 mmol | Freq: Once | INTRAVENOUS | Status: AC
  Administered 2019-05-11: 30 mmol via INTRAVENOUS
  Filled 2019-05-11: qty 10

## 2019-05-11 MED ORDER — POTASSIUM CHLORIDE CRYS ER 20 MEQ PO TBCR
40.0000 meq | EXTENDED_RELEASE_TABLET | Freq: Two times a day (BID) | ORAL | Status: AC
  Administered 2019-05-11 (×2): 40 meq via ORAL
  Filled 2019-05-11 (×2): qty 2

## 2019-05-11 MED ORDER — HEPARIN BOLUS VIA INFUSION
3000.0000 [IU] | Freq: Once | INTRAVENOUS | Status: AC
  Administered 2019-05-11: 3000 [IU] via INTRAVENOUS
  Filled 2019-05-11: qty 3000

## 2019-05-11 MED ORDER — ENSURE ENLIVE PO LIQD
237.0000 mL | Freq: Two times a day (BID) | ORAL | Status: DC
  Administered 2019-05-11 – 2019-05-14 (×6): 237 mL via ORAL

## 2019-05-11 MED ORDER — ADULT MULTIVITAMIN W/MINERALS CH
1.0000 | ORAL_TABLET | Freq: Every day | ORAL | Status: DC
  Administered 2019-05-11 – 2019-05-14 (×4): 1 via ORAL
  Filled 2019-05-11 (×4): qty 1

## 2019-05-11 MED ORDER — GADOBUTROL 1 MMOL/ML IV SOLN
5.0000 mL | Freq: Once | INTRAVENOUS | Status: AC | PRN
  Administered 2019-05-11: 5 mL via INTRAVENOUS

## 2019-05-11 MED ORDER — SODIUM CHLORIDE 0.9 % IV SOLN
2.0000 g | Freq: Two times a day (BID) | INTRAVENOUS | Status: AC
  Administered 2019-05-12 – 2019-05-13 (×4): 2 g via INTRAVENOUS
  Filled 2019-05-11 (×4): qty 2

## 2019-05-11 NOTE — Plan of Care (Signed)

## 2019-05-11 NOTE — Progress Notes (Signed)
ANTICOAGULATION CONSULT NOTE - Follow Up Consult  Pharmacy Consult for Heparin Indication: DVT - RUE  Allergies  Allergen Reactions  . Gabapentin Other (See Comments)    "Made me feel odd, so I stopped taking it"    Patient Measurements: Height: 5\' 3"  (160 cm) Weight: 125 lb 14.1 oz (57.1 kg) IBW/kg (Calculated) : 52.4 Heparin Dosing Weight: 57.1 kg  Vital Signs: Temp: 97.8 F (36.6 C) (03/16 1307) Temp Source: Oral (03/16 1307) BP: 137/86 (03/16 1307) Pulse Rate: 112 (03/16 1307)  Labs: Recent Labs    05/09/19 0433 05/09/19 2313 05/10/19 0343 05/10/19 1030 05/10/19 1844 05/11/19 0136 05/11/19 0426 05/11/19 1302  HGB 11.4*  --  11.8*  --   --   --  11.0*  --   HCT 34.9*  --  36.7  --   --   --  33.8*  --   PLT 341  --  304  --   --   --  272  --   HEPARINUNFRC  --    < >  --    < > 0.13* 0.14*  --  0.13*  CREATININE 1.29*  --  1.26*  --   --   --   --  1.33*   < > = values in this interval not displayed.    Estimated Creatinine Clearance: 42.8 mL/min (A) (by C-G formula based on SCr of 1.33 mg/dL (H)).  Assessment:  48yof with  history of recurrent melanoma with lymph node involvement s/p right axillary lymph node dissection in February. Complains of RUE swelling and pain > duplex positive for RUE DVT on 3/14 and Heparin drip begun.  LE duplex negative.  VVS evaluated for possible compartment syndrome, negative.   Heparin level remains subtherapeutic (0.13) after 4th re-bolus and rate increase, currently at 1700 units/hr.  No known infusion problems. CBC trending down some, no bleeding reported.    Goal of Therapy:  Heparin level 0.3-0.7 units/ml Monitor platelets by anticoagulation protocol: Yes   Plan:   Heparin 3000 units IV re-bolus  Increase heparin drip to 1950 units/hr  Heparin level ~6 hrs after rate change.  Daily heparin level and CBC.  Will follow up anticoagulation plans.   Arty Baumgartner, [place] Phone: [phone] 05/11/2019,2:46 PM

## 2019-05-11 NOTE — Care Management (Signed)
Per Levada Dy  W/[facility]: Co-pay amount for Eliquis 5mg .30 day supply twice a day zero co-pay.  No PA required.  [facility].

## 2019-05-11 NOTE — Plan of Care (Signed)
  Problem: Education: °Goal: Knowledge of General Education information will improve °Description: Including pain rating scale, medication(s)/side effects and non-pharmacologic comfort measures °Outcome: Progressing °  °Problem: Health Behavior/Discharge Planning: °Goal: Ability to manage health-related needs will improve °Outcome: Progressing °  °Problem: Clinical Measurements: °Goal: Ability to maintain clinical measurements within normal limits will improve °Outcome: Progressing °Goal: Diagnostic test results will improve °Outcome: Progressing °  °Problem: Elimination: °Goal: Will not experience complications related to bowel motility °Outcome: Progressing °  °Problem: Pain Managment: °Goal: General experience of comfort will improve °Outcome: Progressing °  °Problem: Safety: °Goal: Ability to remain free from injury will improve °Outcome: Progressing °  °Problem: Skin Integrity: °Goal: Risk for impaired skin integrity will decrease °Outcome: Progressing °  °

## 2019-05-11 NOTE — Progress Notes (Signed)
Patient asking for Lomotil;says she's had 12 stools today. She states that every time she Angelica Dunn has a BM. Text paged M.Sharlet Salina.

## 2019-05-11 NOTE — Care Management (Signed)
Entered benefits check for Eliquis 5mg  twice daily  For 30 days.    Will provide 30 day free card and co pay card also.   Magdalen Spatz RN

## 2019-05-11 NOTE — Progress Notes (Signed)
CRITICAL VALUE ALERT  Critical Value:  Potassium 2.7  Date & Time Notied:  05/11/2019 at 1414  Provider Notified: Raiford Noble, DO paged  Orders Received/Actions taken: Awaiting physician's response

## 2019-05-11 NOTE — Progress Notes (Signed)
[place] for Heparin Indication: DVT - RUE  Allergies  Allergen Reactions  . Gabapentin Other (See Comments)    "Made me feel odd, so I stopped taking it"    Patient Measurements: Height: 5\' 3"  (160 cm) Weight: 125 lb 14.1 oz (57.1 kg) IBW/kg (Calculated) : 52.4   Vital Signs: Temp: 98.1 F (36.7 C) (03/16 0047) Temp Source: Oral (03/16 0047) BP: 125/71 (03/16 0047) Pulse Rate: 125 (03/16 0047)  Labs: Recent Labs    05/08/19 0357 05/08/19 0935 05/08/19 0935 05/09/19 0433 05/09/19 2313 05/10/19 0343 05/10/19 1030 05/10/19 1844 05/11/19 0136  HGB  --  11.7*   < > 11.4*  --  11.8*  --   --   --   HCT  --  36.5  --  34.9*  --  36.7  --   --   --   PLT  --  358  --  341  --  304  --   --   --   HEPARINUNFRC  --   --   --   --    < >  --  <0.10* 0.13* 0.14*  CREATININE 1.42*  --   --  1.29*  --  1.26*  --   --   --    < > = values in this interval not displayed.    Estimated Creatinine Clearance: 45.2 mL/min (A) (by C-G formula based on SCr of 1.26 mg/dL (H)).   Medical History: Past Medical History:  Diagnosis Date  . No pertinent past medical history       Assessment: 48yof with  history of recurrent melanoma with lymph node involvement s/p right axillary lymph node dissection in February. Complains of RUE swelling and pain + VTE - will being  Heparin drip.   3/16 AM update:  Heparin level remains low but slowly trending up No issues per RN  Goal of Therapy:  Heparin level 0.3-0.7 units/ml Monitor platelets by anticoagulation protocol: Yes   Plan:  Heparin bolus 2000 units x 1 Heparin drip to 1700 units/hr 1200 heparin level Monitor s/s bleeding Consider transition to Lovenox soon   Narda Bonds, PharmD, [place] Pharmacist Phone: [phone]

## 2019-05-11 NOTE — Progress Notes (Signed)
PROGRESS NOTE    Angelica Dunn  ONG:[id number] DOB: 1971/01/01 DOA: 05/06/2019 PCP: Renaldo Reel, PA    Brief Narrative:  HPI per Angelica Dunn on 05/06/19 Patient is a 49 year old lady with history of recurrent melanoma with lymph node involvement.  Patient underwent right axillary lymph node dissection on March 31, 2019, as well as, placement of port for possible immunotherapy.  Postop.  Has been complicated by significantly elevated liver enzymes.  Patient was referred to the GI team by the PCP, patient was seen earlier today.  Liver enzymes remain significantly elevated.  Additionally, patient has significant leukocytosis, with WBC of 39,000.  Patient denies fever, chills, headache, neck pain, URI symptoms, chest pain, shortness of breath, GI symptoms or urinary symptoms.  Abdominal ultrasound revealed biliary dilatation with common bile duct measuring 10 mm.  Gallbladder was said to be mildly distended, but otherwise normal.  No gallstones.  Right-sided pleural effusion was incidentally noted, which is said to be new.  Patient will be admitted for further assessment and management.  ED Course: On presentation to the hospital, vitals revealed temperature of 98.5, heart rate of 140 140 bpm, respiratory rate of 15, blood pressure of 110/74 mmHg and O2 sat of 99%.  Patient has been pancultured by the ER providers.  Patient will be started IV vancomycin and Zosyn.  Pertinent labs: CBC reveals WBC of 39, hemoglobin of 14.7, hematocrit of 44.3 with platelet count of 500.  Test revealed ALT of 741, AST of 486, alkaline phosphatase of 225, albumin of 3.2, direct bilirubin of 1.5 with total bilirubin of 2 and total protein of 7.2.  Imaging: independently reviewed.   Review of Systems:  Negative for fever, visual changes, sore throat, rash, new muscle aches, chest pain, SOB, dysuria, bleeding, n/v/abdominal pain.  Patient has right axillary discomfort/pain following lymph node  dissection on March 30, 2019.  **Interim History Patient was still complaining of some right axillary pain that was her main complaint is that her arm felt "heavy".  She thinks it is a little bit more swollen and heavier so we will obtain a upper extremity duplex to rule out DVT as it did look a little bit more swollen.  The ultrasound did show an acute DVT involving the right brachial veins, right radial veins and the right ulnar veins and they were unable to visualize the cephalic and basilic veins.  She was started on anticoagulation with heparin drip and I have asked oncology for to weigh in on anticoagulation recommendations.  Dr. Morey Hummingbird now recommends Eliquis at discharge as the patient is unwilling to give herself Lovenox injections  Patient was happy to learn that her LFTs are trending down along with her bilirubin slightly and increased from yesterday to today and are going up again.  She was started on IV fluids and diet given that no ERCP will be done .  General Surgery recommends continuing IV antibiotics at this time empirically I have stopped her vancomycin given her negative MRSA PCR.  Gastroenterology has now signed off as they felt that her elevated LFTs were secondary to drug-induced liver injury question if this is secondary to autoimmune parameters given that she has a positive ANA.  The day before yesterday the patient has also been complaining about some constipation and had a small bowel movement but will continue stool softeners and if necessary will try an enema.  Because her legs were somewhat swollen we also obtained a venous duplex which did not show any  evidence of a DVT bilaterally.  Her leg swelling is secondary to her poor nutritional status and hypoalbuminemia so nutritionist has been consulted for further evaluation.  Oncology has also recommended the patient undergo evaluation for metastatic brain cancer given her melanoma and they have recommended MRI of the brain with  and without contrast.  She did have an acidosis and she was changed from normal saline to sodium bicarbonate and this is improving slightly.  Assessment & Plan:   Active Problems:   Leukocytosis   Melanoma of skin (HCC)  Leukocytosis with SIRS-like picture, present on admission and possibly in the setting of her malignancy -Admitted patient to inpatient MedSurg -Patient was tachycardic and has a leukocytosis but currently has no source of infection -WBC was close to 40,000 on admission and initially was trending down but started trending back up as soon as below -Panculture patient and obtain blood cultures, urine culture, urinalysis as well as a respiratory virus panel and mono screen -MRI of the abdomen as below -Broad-spectrum antibiotics changed to IV Vancomycin/IV Cefepime now; I stopped the patient's vancomycin as she is MRSA PCR negative and will continue empiric cefepime for now -General surgery recommending just IV antibiotics -I have changed her normal saline to sodium bicarbonate given her metabolic acidosis with 791 mEq at a rate of 75 MLS per hour -Follow cultures -Procalcitonin level is 1.50 -Repeat CBC in the AM -Recently was slowly improving and went from 29.4 but when up again is now 33.7 and today is 33.2 -She is afebrile -Obtain documentation from patient's oncologist, Dr. Bobby Rumpf, based in [place], [facility]). -Further management depend on hospital course.  Recurrent Melanoma with Metastatic Lymph node involvement associated with malaise and fatigue -Patient is status post lymph node dissection. -She has had 7 out of 19 lymph nodes positive for metastatic melanoma and breast lesion was complex sclerosing lesion -He has a port in place but has not started immunotherapy due to her abnormalities -Immunotherapy is currently on hold due to abnormal liver function tests and will be starting outpatient -GI team is managing abnormal LFTs and recommends continue  to monitor for now and no ERCP. -Patient is known to an oncologist based [place], [place] have consulted medical oncology for further evaluation recommendations and they feel that no inpatient treatment is recommended at this time -CT of the chest PE was done and showed "No CT findings for pulmonary embolism. Extensive, aggressive and progressive infiltrating tumor in the right axilla. This is invading the right chest wall and involving the right chest wall musculature as above. No mediastinal or hilar mass or adenopathy.  New small right pleural effusion with overlying atelectasis. No definite metastatic pulmonary nodules." -Has worsening Right Arm Swelling so will obtain a RUQ Venous Duplex and it showed an acute right arm DVT -Patient will receive Keytruda in outpatient setting -Medical oncology also recommending obtaining an MRI of the brain with and without contrast which was done today and showed no evidence of metastatic intracranial disease -We will continue to monitor carefully and appreciate further oncology recommendations  Right axillary pain secondary to above and possibly secondary to DVT now -We will oxycodone and have added IV fentanyl -Received IV morphine in the ED yesterday -Continue to adjust pain regimen; patient states that her pain is somewhat controlled with the fentanyl but does not last very long so increased Fentanyl to every 1 hours -Checked Right upper quadrant venous duplex and showed an acute DVT -Consulted Vascular Surgery given worsening  Arm Swelling and "Tightness"; Want to r/o Compartment Syndrome and appreciate Dr. Nona Dell evaluation and he clinically does not feel this is compartment syndrome and feels that this is secondary to her DVT but recommended if she has continued swelling develops pain in the forearm numbness and tingling in the hand they recommend reconsult for potential hand surgery for fasciotomies  Acute Right Arm DVT in the setting of  her malignancy and right arm lymphedema -The ultrasound did show an acute DVT involving the right brachial veins, right radial veins and the right ulnar veins and they were unable to visualize the cephalic and basilic veins.   -She was started on anticoagulation with heparin drip and I have asked oncology for to weigh in on anticoagulation recommendations. -Dr. Burr Medico recommends lifelong anticoagulation and agrees with heparin while she is hospitalized but recommended Lovenox injections as an outpatient however patient is not comfortable to do self injections and Dr. Burr Medico recommends that her most likely convenient therapy for her would be Eliquis and recommend starting 10 mg p.o. twice daily for 7 days and then 5 mg p.o. twice daily after that -May consider further imaging of the arm  Renal insufficiency and question if this is chronic kidney disease Metabolic Acidosis -BUN/creatinine went from 12/1.26 and is now 9/1.33 today -Started IV fluid hydration as above and changed to sodium bicarbonate 150 mEQ at 75 mL/hr yesterday -Patient's CO2 was 21, chloride level was 109, anion gap was 12; Today her CO2 is 19, chloride level is 114, and anion gap is 9 -Avoid nephrotoxic medications, contrast dyes, hypotension and I have stopped her lisinopril and will change her IV Zosyn to IV cefepime -Renally adjust medications -Continue monitor trend liver function panel -Repeat CMP in a.m.  Hypercalcemia, likely of malignancy, improving -Likely malignancy in the setting of melanoma -Started on normal saline at 75 MLS per hour and will change to sodium bicarb and 150 mEq at 75 mL's per hour -Calcium went from 13.0 yesterday and is now 8.8 this morning -Hematology/Oncology consulted for further evaluation and will check PTHrP pending and PTH was 16 -We will give Zometa 4 mg IV x1  -Continue monitor and trend calcium level -Repeat CMP in a.m.  Thrombocytosis, improved -Trending downwards -Patient's platelet  count went from 508,000 -> 431,000 -> 358,000 -> 341,000 -> 304,000 -> 272,000 -Continue to Monitor and Trend and Monitor for S/Sx of Bleeding  -Repeat CBC in AM   Abnormal LFTs and ? Autoimmune Hepatitis, initially improved but now worsening again Hyperbilirubinemia, improving -initially was trending down now and now trending back up as AST is 214 and ALT is 239-Her EBV IgG was 409 and IgM was less than 36.0 and an EBV NA IgG was 51.8 -T bili was now trending down and went from 2.5 is now 1.2 but now trended back up again and is 2.2 -Alk phos is now 173 -MRI/MRCP of the abdomen showed "No evidence of biliary obstruction or choledocholithiasis. No evidence of hepatic abscess or mass. Small right pleural effusion, and right chest and abdominal wall edema. Probable postop seroma in the right breast." -Mono screen is negative -Alpha-1 antitrypsin serum level was greater than 300 -Ceruloplasmin was 44  -Continue to monitor LFTs and trend -GI recommending no plans for ERCP and recommends continue empiric IV antibiotics and is waiting other viral hepatology's and have added an ANA as well as a CMV IgM.;  GI felt this was felt drug-induced liver injury but ? If this is Autoimmune -Patient's ANA  was positive and her ANA pattern was cytoplasmic fluorescence for hep to slide an ANA titer was 1:80 -Will need to discuss with GI further I spoke with Dr. Ardis Hughs who recommends outpatient follow-up in adding a anti-smooth muscle antibody as well as a antimitochondrial antibody  -HCV quantitative log was less than 1.18 and HCV RNA PCR was less than 15 -Continue to follow Hepatic Fxn Panel and GI recommendations  Hypokalemia  -Patient's potassium this morning was 2.7 -Replete with p.o. potassium chloride 40 mEq twice daily x2 doses and with IV K-Phos 30 mmol -Continue monitor and replete as necessary -Repeat CMP in the a.m.  Hypophosphatemia   -Patient's phosphorus level was 1.7 -Replete with IV K-Phos 30  mmol -Continue To monitor and replete as necessary -Repeat phosphorus level in the a.m.  Normocytic Anemia -Patient's Hgb/Hct went from 13.5/41.6 -> 11.7/36.5 -> 11.4/34.9 -> 11.8/36.7 -> 11.0/33.8 -Checked Anemia Panel and iron level was 27, U IBC is 106, TIBC is 133, saturation ratios of 20%, ferritin level was 488, folate levels 12.5, and vitamin B12 level is 3857 -Continue to Monitor for S/Sx of Bleeding; Currently no overt bleeding noted -Repeat CBC in AM   Constipation -Start bowel regimen with senna docusate 1 tab p.o. twice daily, MiraLAX 17 g p.o. twice daily and bisacodyl 10 mg rectal suppository daily as needed for moderate constipation -Continue to monitor and if not improving will try an enema but will for now hold off on enema today  Poor Po Intake/Low Albumin  -Consult Nutrition for further evaluation recommendations -Albumin was 1.5 -Dietitian recommends multivitamin with mineral daily as well as Ensure Enlive p.o. twice daily  DVT prophylaxis: SCDs Code Status: FULL CODE  Family Communication: No family present at bedside  Disposition Plan: Patient is from home and now has significant leukocytosis and malaise and will need pending further work-up of her leukocytosis and improvement of LFTs long with further work-up given that she now has a DVT and iron acidosis and clearance by GI, general surgery as well as hematology oncology.  Her acidosis is slowly improving however she is remains to be fatigued and does appear uncomfortable.  Consultants:   Gastroenterology  General Surgery  Medical Oncology   Vascular Surgery   Procedures: MRCP   Antimicrobials:  Anti-infectives (From admission, onward)   Start     Dose/Rate Route Frequency Ordered Stop   05/12/19 0030  ceFEPIme (MAXIPIME) 2 g in sodium chloride 0.9 % 100 mL IVPB     2 g 200 mL/hr over 30 Minutes Intravenous Every 12 hours 05/11/19 1337     05/07/19 2200  vancomycin (VANCOREADY) IVPB 750 mg/150 mL   Status:  Discontinued     750 mg 150 mL/hr over 60 Minutes Intravenous Every 24 hours 05/07/19 0410 05/09/19 0938   05/07/19 1330  ceFEPIme (MAXIPIME) 2 g in sodium chloride 0.9 % 100 mL IVPB  Status:  Discontinued     2 g 200 mL/hr over 30 Minutes Intravenous Every 12 hours 05/07/19 1239 05/11/19 1337   05/07/19 0600  piperacillin-tazobactam (ZOSYN) IVPB 3.375 g  Status:  Discontinued     3.375 g 12.5 mL/hr over 240 Minutes Intravenous Every 8 hours 05/07/19 0410 05/07/19 1238   05/06/19 2145  piperacillin-tazobactam (ZOSYN) IVPB 3.375 g     3.375 g 100 mL/hr over 30 Minutes Intravenous  Once 05/06/19 2144 05/06/19 2232   05/06/19 2145  vancomycin (VANCOREADY) IVPB 1250 mg/250 mL     1,250 mg 166.7 mL/hr over 90 Minutes  Intravenous  Once 05/06/19 2144 05/07/19 0004     Subjective: Patient was seen and examined at bedside and she has been trying to elevate her right arm but states that she cannot do 24/7.  No nausea or vomiting but states that she had some chills right after her sponge bath and when I examined her her teeth were chattering.  She complains of significant arm pain.  No nausea or vomiting.  No other concerns or complaints at this time and thinks that her arm and hand swelling has not really improved.   Objective: Vitals:   05/11/19 0047 05/11/19 0456 05/11/19 0920 05/11/19 1307  BP: 125/71 132/81 136/82 137/86  Pulse: (!) 125 (!) 116 (!) 111 (!) 112  Resp: _0 Temp: 98.1 F (36.7 C) 97.7 F (36.5 C) 97.6 F (36.4 C) 97.8 F (36.6 C)  TempSrc: Oral Axillary Oral Oral  SpO2: 100% 100% 100% 100%  Weight:      Height:        Intake/Output Summary (Last 24 hours) at 05/11/2019 1617 Last data filed at 05/11/2019 0800 Gross per 24 hour  Intake 591.54 ml  Output --  Net 591.54 ml   Filed Weights   05/07/19 0107  Weight: 57.1 kg   Examination: Physical Exam:  Constitutional: Thin Caucasian female currently in mild distress appears anxious and  uncomfortable secondary to pain and is also complaining of being cold  Eyes: Lids and conjunctivae normal, sclerae anicteric  ENMT: External Ears, Nose appear normal. Grossly normal hearing. .  Neck: Appears normal, supple, no cervical masses, normal ROM, no appreciable thyromegaly; no JVD Respiratory: Diminished to auscultation bilaterally, no wheezing, rales, rhonchi or crackles. Normal respiratory effort and patient is not tachypenic. No accessory muscle use.  Unlabored breathing Cardiovascular: Tachycardic rate but regular rhythm, no murmurs / rubs / gallops. S1 and S2 auscultated.  Has significant upper right arm extremity edema and 1+ lower extremity edema Abdomen: Soft, non-tender, nondistended. Bowel sounds positive.  GU: Deferred. Musculoskeletal: No clubbing / cyanosis of digits/nails. No joint deformity upper and lower extremities but right arm is extremely swollen and she has a big lesion/induration in the right axilla Skin: No appreciable rashes but again right arm seems more swollen.  Rest of skin appears warm and dry Neurologic: CN 2-12 grossly intact with no focal deficits. Romberg sign and cerebellar reflexes not assessed.  Psychiatric: Normal judgment and insight. Alert and oriented x 3.  Appears to have been anxious mood and appropriate affect.   Data Reviewed: I have personally reviewed following labs and imaging studies  CBC: Recent Labs  Lab 05/06/19 1056 05/06/19 1056 05/07/19 0148 05/07/19 0148 05/07/19 1043 05/08/19 0935 05/09/19 0433 05/10/19 0343 05/11/19 0426  WBC 39.0 Repeated and verified X2.*   < > 34.9*   < > 31.7* 30.5* 29.4* 33.7* 33.2*  NEUTROABS 35.0*  --  29.8*  --   --  29.0* 24.3* 26.9*  --   HGB 14.7   < > 13.2   < > 13.5 11.7* 11.4* 11.8* 11.0*  HCT 44.3   < > 41.7   < > 41.6 36.5 34.9* 36.7 33.8*  MCV 88.8   < > 92.5   < > 90.6 93.1 89.7 92.0 91.1  PLT 508.0 Repeated and verified X2.*   < > 460*   < > 431* 358 341 304 272   < > = values in  this interval not displayed.   Basic Metabolic Panel: Recent Labs  Lab 05/07/19 0148 05/07/19 1043 05/07/19 1822 05/08/19 0357 05/09/19 0433 05/10/19 0343 05/11/19 1302  NA 141   < > 136 141 139 140 142  K 3.7   < > 5.5* 3.0* 3.6 3.3* 2.7*  CL 108   < > 106 112* 114* 115* 114*  CO2 20*   < > 21* 22 17* 16* 19*  GLUCOSE 79   < > 107* 102* 85 118* 146*  BUN 24*   < > 24* _0 CREATININE 1.45*   < > 1.60* 1.42* 1.29* 1.26* 1.33*  CALCIUM 12.5*   < > 11.8* 11.8*  11.8* 9.7 9.1 8.8*  MG 2.1  --   --  1.7 1.9 1.9 1.9  PHOS 4.8*  --   --  3.0 1.8* 2.3* 1.7*   < > = values in this interval not displayed.   GFR: Estimated Creatinine Clearance: 42.8 mL/min (A) (by C-G formula based on SCr of 1.33 mg/dL (H)). Liver Function Tests: Recent Labs  Lab 05/07/19 1822 05/08/19 0357 05/09/19 0433 05/10/19 0343 05/11/19 1302  AST 176* 87* 109* 150* 214*  ALT 357* 261* 209* 212* 239*  ALKPHOS 169* 150* 154* 175* 173*  BILITOT 2.4* 1.5* 1.2 2.0* 2.2*  PROT 5.3* 4.8* 4.8* 5.0* 4.8*  ALBUMIN 2.3* 1.8* 1.7* 1.7* 1.5*   Recent Labs  Lab 05/07/19 0148  LIPASE 16   No results for input(s): AMMONIA in the last 168 hours. Coagulation Profile: Recent Labs  Lab 05/06/19 1056  INR 1.3*   Cardiac Enzymes: No results for input(s): CKTOTAL, CKMB, CKMBINDEX, TROPONINI in the last 168 hours. BNP (last 3 results) No results for input(s): PROBNP in the last 8760 hours. HbA1C: No results for input(s): HGBA1C in the last 72 hours. CBG: No results for input(s): GLUCAP in the last 168 hours. Lipid Profile: No results for input(s): CHOL, HDL, LDLCALC, TRIG, CHOLHDL, LDLDIRECT in the last 72 hours. Thyroid Function Tests: No results for input(s): TSH, T4TOTAL, FREET4, T3FREE, THYROIDAB in the last 72 hours. Anemia Panel: Recent Labs    05/10/19 0343  VITAMINB12 3,857*  FOLATE 12.5  FERRITIN 488*  TIBC 133*  IRON 27*  RETICCTPCT 2.3   Sepsis Labs: Recent Labs  Lab  05/07/19 0148  PROCALCITON 1.50    Recent Results (from the past 240 hour(s))  Blood culture (routine x 2)     Status: None   Collection Time: 05/06/19  9:42 PM   Specimen: BLOOD  Result Value Ref Range Status   Specimen Description BLOOD LEFT ARM  Final   Special Requests   Final    BOTTLES DRAWN AEROBIC AND ANAEROBIC Blood Culture results may not be optimal due to an excessive volume of blood received in culture bottles   Culture   Final    NO GROWTH 5 DAYS Performed at [facility], [place] [address], [place], [place] [address]    Report Status 05/11/2019 FINAL  Final  Respiratory Panel by RT PCR (Flu A&B, Covid) - Nasopharyngeal Swab     Status: None   Collection Time: 05/06/19 10:35 PM   Specimen: Nasopharyngeal Swab  Result Value Ref Range Status   SARS Coronavirus 2 by RT PCR NEGATIVE NEGATIVE Final    Comment: (NOTE) SARS-CoV-2 target nucleic acids are NOT DETECTED. The SARS-CoV-2 RNA is generally detectable in upper respiratoy specimens during the acute phase of infection. The lowest concentration of SARS-CoV-2 viral copies this assay can detect is 131 copies/mL. A negative result does  not preclude SARS-Cov-2 infection and should not be used as the sole basis for treatment or other patient management decisions. A negative result may occur with  improper specimen collection/handling, submission of specimen other than nasopharyngeal swab, presence of viral mutation(s) within the areas targeted by this assay, and inadequate number of viral copies (<131 copies/mL). A negative result must be combined with clinical observations, patient history, and epidemiological information. The expected result is Negative. Fact Sheet for Patients:  PinkCheek.be Fact Sheet for Healthcare Providers:  GravelBags.it This test is not yet ap proved or cleared by the [place] FDA and  has been authorized for detection and/or  diagnosis of SARS-CoV-2 by FDA under an Emergency Use Authorization (EUA). This EUA will remain  in effect (meaning this test can be used) for the duration of the COVID-19 declaration under Section 564(b)(1) of the Act, 21 U.S.C. section 360bbb-3(b)(1), unless the authorization is terminated or revoked sooner.    Influenza A by PCR NEGATIVE NEGATIVE Final   Influenza B by PCR NEGATIVE NEGATIVE Final    Comment: (NOTE) The Xpert Xpress SARS-CoV-2/FLU/RSV assay is intended as an aid in  the diagnosis of influenza from Nasopharyngeal swab specimens and  should not be used as a sole basis for treatment. Nasal washings and  aspirates are unacceptable for Xpert Xpress SARS-CoV-2/FLU/RSV  testing. Fact Sheet for Patients: PinkCheek.be Fact Sheet for Healthcare Providers: GravelBags.it This test is not yet approved or cleared by the [place] FDA and  has been authorized for detection and/or diagnosis of SARS-CoV-2 by  FDA under an Emergency Use Authorization (EUA). This EUA will remain  in effect (meaning this test can be used) for the duration of the  Covid-19 declaration under Section 564(b)(1) of the Act, 21  U.S.C. section 360bbb-3(b)(1), unless the authorization is  terminated or revoked. Performed at [facility], [place] [address], [place], [place] [address]   Culture, Urine     Status: None   Collection Time: 05/07/19  1:21 AM   Specimen: Urine, Random  Result Value Ref Range Status   Specimen Description URINE, RANDOM  Final   Special Requests NONE  Final   Culture   Final    NO GROWTH Performed at [facility], [place] [address] [place], [place] [address]    Report Status 05/08/2019 FINAL  Final  Blood culture (routine x 2)     Status: None (Preliminary result)   Collection Time: 05/07/19  3:13 AM   Specimen: BLOOD  Result Value Ref Range Status   Specimen Description BLOOD LEFT ARM  Final   Special  Requests   Final    BOTTLES DRAWN AEROBIC ONLY Blood Culture adequate volume   Culture   Final    NO GROWTH 4 DAYS Performed at [facility], [place] [address] [place], [place] [address]    Report Status PENDING  Incomplete  MRSA PCR Screening     Status: None   Collection Time: 05/08/19  5:19 PM   Specimen: Nasopharyngeal  Result Value Ref Range Status   MRSA by PCR NEGATIVE NEGATIVE Final    Comment:        The GeneXpert MRSA Assay (FDA approved for NASAL specimens only), is one component of a comprehensive MRSA colonization surveillance program. It is not intended to diagnose MRSA infection nor to guide or monitor treatment for MRSA infections. Performed at [facility], [place] [address] [place], [place] [address]      RN  Pressure Injury Documentation:     Estimated body mass index is 22.3 kg/m as calculated from the following:   Height as of this encounter: 5' 3" (1.6 m).   Weight as of this encounter: 57.1 kg.  Malnutrition Type:  Nutrition Problem: Increased nutrient needs Etiology: cancer and cancer related treatments   Malnutrition Characteristics:  Signs/Symptoms: estimated needs   Nutrition Interventions:  Interventions: Ensure Enlive (each supplement provides 350kcal and 20 grams of protein), MVI  Radiology Studies: MR BRAIN W WO CONTRAST  Result Date: 05/11/2019 CLINICAL DATA:  Melanoma, staging EXAM: MRI HEAD WITHOUT AND WITH CONTRAST TECHNIQUE: Multiplanar, multiecho pulse sequences of the brain and surrounding structures were obtained without and with intravenous contrast. CONTRAST:  72m GADAVIST GADOBUTROL 1 MMOL/ML IV SOLN COMPARISON:  None. FINDINGS: Brain: There is no acute infarction or intracranial hemorrhage. There is no intracranial mass, mass effect, or edema. There is no hydrocephalus or extra-axial fluid collection. No abnormal enhancement. Vascular: Major vessel flow voids at the skull base are preserved. Skull and upper cervical  spine: No focal suspicious osseous lesion. Sinuses/Orbits: Paranasal sinuses are aerated. Orbits are unremarkable. Other: Sella is unremarkable. Minor left mastoid fluid opacification. IMPRESSION: No evidence of intracranial metastatic disease. Electronically Signed   By: PMacy MisM.D.   On: 05/11/2019 12:13   VAS [place]OWER EXTREMITY VENOUS (DVT)  Result Date: 05/10/2019  Lower Venous DVTStudy Indications: Swelling.  Risk Factors: None identified. Comparison Study: No prior studies. Performing Technologist: GOliver HumRVT  Examination Guidelines: A complete evaluation includes B-mode imaging, spectral Doppler, color Doppler, and power Doppler as needed of all accessible portions of each vessel. Bilateral testing is considered an integral part of a complete examination. Limited examinations for reoccurring indications may be performed as noted. The reflux portion of the exam is performed with the patient in reverse Trendelenburg.  +---------+---------------+---------+-----------+----------+--------------+ RIGHT    CompressibilityPhasicitySpontaneityPropertiesThrombus Aging +---------+---------------+---------+-----------+----------+--------------+ CFV      Full           Yes      Yes                                 +---------+---------------+---------+-----------+----------+--------------+ SFJ      Full                                                        +---------+---------------+---------+-----------+----------+--------------+ FV Prox  Full                                                        +---------+---------------+---------+-----------+----------+--------------+ FV Mid   Full                                                        +---------+---------------+---------+-----------+----------+--------------+ FV DistalFull                                                        +---------+---------------+---------+-----------+----------+--------------+  PFV      Full                                                        +---------+---------------+---------+-----------+----------+--------------+ POP      Full           Yes      Yes                                 +---------+---------------+---------+-----------+----------+--------------+ PTV      Full                                                        +---------+---------------+---------+-----------+----------+--------------+ PERO     Full                                                        +---------+---------------+---------+-----------+----------+--------------+   +---------+---------------+---------+-----------+----------+--------------+ LEFT     CompressibilityPhasicitySpontaneityPropertiesThrombus Aging +---------+---------------+---------+-----------+----------+--------------+ CFV      Full           Yes      Yes                                 +---------+---------------+---------+-----------+----------+--------------+ SFJ      Full                                                        +---------+---------------+---------+-----------+----------+--------------+ FV Prox  Full                                                        +---------+---------------+---------+-----------+----------+--------------+ FV Mid   Full                                                        +---------+---------------+---------+-----------+----------+--------------+ FV DistalFull                                                        +---------+---------------+---------+-----------+----------+--------------+ PFV      Full                                                        +---------+---------------+---------+-----------+----------+--------------+  POP      Full           Yes      Yes                                 +---------+---------------+---------+-----------+----------+--------------+ PTV      Full                                                         +---------+---------------+---------+-----------+----------+--------------+ PERO     Full                                                        +---------+---------------+---------+-----------+----------+--------------+     Summary: RIGHT: - There is no evidence of deep vein thrombosis in the lower extremity.  - No cystic structure found in the popliteal fossa.  LEFT: - There is no evidence of deep vein thrombosis in the lower extremity.  - No cystic structure found in the popliteal fossa.  *See table(s) above for measurements and observations. Electronically signed by Ruta Hinds MD on 05/10/2019 at 7:07:53 PM.    Final    Scheduled Meds: . Chlorhexidine Gluconate Cloth  6 each Topical Daily  . feeding supplement (ENSURE ENLIVE)  237 mL Oral BID BM  . loratadine  10 mg Oral Daily  . multivitamin with minerals  1 tablet Oral Daily  . polyethylene glycol  17 g Oral BID  . potassium chloride  40 mEq Oral BID  . senna-docusate  1 tablet Oral BID  . sodium bicarbonate  650 mg Oral BID  . sodium chloride flush  10-40 mL Intracatheter Q12H   Continuous Infusions: . [START ON 05/12/2019] ceFEPime (MAXIPIME) IV    . heparin 1,950 Units/hr (05/11/19 1516)  . potassium PHOSPHATE IVPB (in mmol) 30 mmol (05/11/19 1555)  . sodium bicarbonate 150 mEq in dextrose 5% 1000 mL 150 mEq (05/11/19 1514)    LOS: 5 days   Kerney Elbe, DO [facility] PAGER is on [place]  If 7PM-7AM, please contact night-coverage [email or web address]

## 2019-05-11 NOTE — Progress Notes (Signed)
Patient off the floor for a procedure RN to replace IV team consult when patient returns

## 2019-05-11 NOTE — Progress Notes (Signed)
Initial Nutrition Assessment  DOCUMENTATION CODES:   Not applicable  INTERVENTION:   -MVI with minerals daily -Ensure Enlive po BID, each supplement provides 350 kcal and 20 grams of protein  NUTRITION DIAGNOSIS:   Increased nutrient needs related to cancer and cancer related treatments as evidenced by estimated needs.  GOAL:   Patient will meet greater than or equal to 90% of their needs  MONITOR:   PO intake, Supplement acceptance, Labs, Weight trends, Skin, I & O's  REASON FOR ASSESSMENT:   Consult Assessment of nutrition requirement/status, Diet education, Poor PO  ASSESSMENT:   Patient is a 49 year old lady with history of recurrent melanoma with lymph node involvement.  Patient underwent right axillary lymph node dissection on March 31, 2019, as well as, placement of port for possible immunotherapy.  Postop.  Has been complicated by significantly elevated liver enzymes.  Patient was referred to the GI team by the PCP, patient was seen earlier today.  Liver enzymes remain significantly elevated.  Additionally, patient has significant leukocytosis, with WBC of 39,000.  Patient denies fever, chills, headache, neck pain, URI symptoms, chest pain, shortness of breath, GI symptoms or urinary symptoms.  Abdominal ultrasound revealed biliary dilatation with common bile duct measuring 10 mm.  Gallbladder was said to be mildly distended, but otherwise normal.  No gallstones.  Right-sided pleural effusion was incidentally noted, which is said to be new.  Patient will be admitted for further assessment and management.  Pt admitted with leukocytosis with SIRS-like picture.   Reviewed I/O's: +1.4 L x 24 hours and +8.3 L since admisison  Attempted to evaluate p, however, pt out of room at time of visit. Attempted to speak with pt via phone later on in the day, however, no answer. RD unable to obtain additional history at this time.   Reviewed wt hx; wt has been stable.   Per chart  review, pt endorses poor appetite. Noted meal completion 50-100%.   Albumin has a half-life of 21 days and is strongly affected by stress response and inflammatory process, therefore, do not expect to see an improvement in this lab value during acute hospitalization. When a patient presents with low albumin, it is likely skewed due to the acute inflammatory response.  Unless it is suspected that patient had poor PO intake or malnutrition prior to admission, then RD should not be consulted solely for low albumin. Note that low albumin is no longer used to diagnose malnutrition; [place] uses the new malnutrition guidelines published by the American Society for Parenteral and Enteral Nutrition (A.S.P.E.N.) and the Academy of Nutrition and Dietetics (AND).    Medications reviewed and include miralax and senokot.   Labs reviewed.   Diet Order:   Diet Order            Diet regular Room service appropriate? Yes; Fluid consistency: Thin  Diet effective now              EDUCATION NEEDS:   No education needs have been identified at this time  Skin:  Skin Assessment: Reviewed RN Assessment  Last BM:  05/11/19  Height:   Ht Readings from Last 1 Encounters:  05/07/19 5\' 3"  (1.6 m)    Weight:   Wt Readings from Last 1 Encounters:  05/07/19 57.1 kg    Ideal Body Weight:  52.3 kg  BMI:  Body mass index is 22.3 kg/m.  Estimated Nutritional Needs:   Kcal:  1700-1900  Protein:  85-100 grams  Fluid:  >  1.7 L    Loistine Chance, RD, LDN, [place] Registered Dietitian II Certified Diabetes Care and Education Specialist Please refer to [facility] for RD and/or RD on-call/weekend/after hours pager

## 2019-05-12 DIAGNOSIS — R0602 Shortness of breath: Secondary | ICD-10-CM

## 2019-05-12 DIAGNOSIS — I82A11 Acute embolism and thrombosis of right axillary vein: Secondary | ICD-10-CM

## 2019-05-12 DIAGNOSIS — D649 Anemia, unspecified: Secondary | ICD-10-CM

## 2019-05-12 DIAGNOSIS — E876 Hypokalemia: Secondary | ICD-10-CM

## 2019-05-12 DIAGNOSIS — R7401 Elevation of levels of liver transaminase levels: Secondary | ICD-10-CM

## 2019-05-12 DIAGNOSIS — K5900 Constipation, unspecified: Secondary | ICD-10-CM

## 2019-05-12 LAB — HEPARIN LEVEL (UNFRACTIONATED)
Heparin Unfractionated: 0.25 IU/mL — ABNORMAL LOW (ref 0.30–0.70)
Heparin Unfractionated: 0.29 IU/mL — ABNORMAL LOW (ref 0.30–0.70)

## 2019-05-12 LAB — CBC WITH DIFFERENTIAL/PLATELET
Abs Immature Granulocytes: 0.5 10*3/uL — ABNORMAL HIGH (ref 0.00–0.07)
Basophils Absolute: 0.2 10*3/uL — ABNORMAL HIGH (ref 0.0–0.1)
Basophils Relative: 1 %
Eosinophils Absolute: 0.4 10*3/uL (ref 0.0–0.5)
Eosinophils Relative: 1 %
HCT: 33.8 % — ABNORMAL LOW (ref 36.0–46.0)
Hemoglobin: 10.9 g/dL — ABNORMAL LOW (ref 12.0–15.0)
Immature Granulocytes: 2 %
Lymphocytes Relative: 10 %
Lymphs Abs: 3.3 10*3/uL (ref 0.7–4.0)
MCH: 29.2 pg (ref 26.0–34.0)
MCHC: 32.2 g/dL (ref 30.0–36.0)
MCV: 90.6 fL (ref 80.0–100.0)
Monocytes Absolute: 4.4 10*3/uL — ABNORMAL HIGH (ref 0.1–1.0)
Monocytes Relative: 13 %
Neutro Abs: 25 10*3/uL — ABNORMAL HIGH (ref 1.7–7.7)
Neutrophils Relative %: 73 %
Platelets: 208 10*3/uL (ref 150–400)
RBC: 3.73 MIL/uL — ABNORMAL LOW (ref 3.87–5.11)
RDW: 14.7 % (ref 11.5–15.5)
WBC: 33.9 10*3/uL — ABNORMAL HIGH (ref 4.0–10.5)
nRBC: 0 % (ref 0.0–0.2)

## 2019-05-12 LAB — COMPREHENSIVE METABOLIC PANEL
ALT: 199 U/L — ABNORMAL HIGH (ref 0–44)
AST: 125 U/L — ABNORMAL HIGH (ref 15–41)
Albumin: 1.4 g/dL — ABNORMAL LOW (ref 3.5–5.0)
Alkaline Phosphatase: 177 U/L — ABNORMAL HIGH (ref 38–126)
Anion gap: 10 (ref 5–15)
BUN: 7 mg/dL (ref 6–20)
CO2: 19 mmol/L — ABNORMAL LOW (ref 22–32)
Calcium: 8.9 mg/dL (ref 8.9–10.3)
Chloride: 112 mmol/L — ABNORMAL HIGH (ref 98–111)
Creatinine, Ser: 1.25 mg/dL — ABNORMAL HIGH (ref 0.44–1.00)
GFR calc Af Amer: 59 mL/min — ABNORMAL LOW (ref >60)
GFR calc non Af Amer: 51 mL/min — ABNORMAL LOW (ref >60)
Glucose, Bld: 130 mg/dL — ABNORMAL HIGH (ref 70–99)
Potassium: 3.1 mmol/L — ABNORMAL LOW (ref 3.5–5.1)
Sodium: 141 mmol/L (ref 135–145)
Total Bilirubin: 2.4 mg/dL — ABNORMAL HIGH (ref 0.3–1.2)
Total Protein: 4.8 g/dL — ABNORMAL LOW (ref 6.5–8.1)

## 2019-05-12 LAB — MAGNESIUM: Magnesium: 1.9 mg/dL (ref 1.7–2.4)

## 2019-05-12 LAB — PHOSPHORUS: Phosphorus: 2.2 mg/dL — ABNORMAL LOW (ref 2.5–4.6)

## 2019-05-12 LAB — CULTURE, BLOOD (ROUTINE X 2)
Culture: NO GROWTH
Special Requests: ADEQUATE

## 2019-05-12 MED ORDER — HEPARIN BOLUS VIA INFUSION
1000.0000 [IU] | Freq: Once | INTRAVENOUS | Status: AC
  Administered 2019-05-12: 1000 [IU] via INTRAVENOUS
  Filled 2019-05-12: qty 1000

## 2019-05-12 MED ORDER — FUROSEMIDE 10 MG/ML IJ SOLN
40.0000 mg | Freq: Once | INTRAMUSCULAR | Status: AC
  Administered 2019-05-12: 40 mg via INTRAVENOUS
  Filled 2019-05-12: qty 4

## 2019-05-12 MED ORDER — SODIUM BICARBONATE 8.4 % IV SOLN
INTRAVENOUS | Status: DC
  Filled 2019-05-12 (×3): qty 150

## 2019-05-12 MED ORDER — MAGNESIUM SULFATE 2 GM/50ML IV SOLN
2.0000 g | Freq: Once | INTRAVENOUS | Status: AC
  Administered 2019-05-12: 2 g via INTRAVENOUS
  Filled 2019-05-12: qty 50

## 2019-05-12 MED ORDER — ONDANSETRON HCL 4 MG/2ML IJ SOLN
4.0000 mg | Freq: Four times a day (QID) | INTRAMUSCULAR | Status: DC | PRN
  Administered 2019-05-12: 4 mg via INTRAVENOUS
  Filled 2019-05-12 (×2): qty 2

## 2019-05-12 MED ORDER — POTASSIUM PHOSPHATES 15 MMOLE/5ML IV SOLN
30.0000 mmol | Freq: Once | INTRAVENOUS | Status: AC
  Administered 2019-05-12: 30 mmol via INTRAVENOUS
  Filled 2019-05-12: qty 10

## 2019-05-12 MED ORDER — POTASSIUM CHLORIDE CRYS ER 20 MEQ PO TBCR
40.0000 meq | EXTENDED_RELEASE_TABLET | ORAL | Status: AC
  Administered 2019-05-12 (×2): 40 meq via ORAL
  Filled 2019-05-12 (×2): qty 2

## 2019-05-12 NOTE — Progress Notes (Signed)
[place] for Heparin Indication: DVT - RUE  Allergies  Allergen Reactions  . Gabapentin Other (See Comments)    "Made me feel odd, so I stopped taking it"    Patient Measurements: Height: 5\' 3"  (160 cm) Weight: 125 lb 14.1 oz (57.1 kg) IBW/kg (Calculated) : 52.4 Heparin Dosing Weight: 57.1 kg  Vital Signs: Temp: 98 F (36.7 C) (03/16 2142) Temp Source: Oral (03/16 2142) BP: 147/83 (03/16 2142) Pulse Rate: 122 (03/16 2142)  Labs: Recent Labs    05/09/19 0433 05/09/19 2313 05/10/19 0343 05/10/19 1030 05/11/19 0136 05/11/19 0426 05/11/19 1302 05/11/19 2211  HGB 11.4*  --  11.8*  --   --  11.0*  --   --   HCT 34.9*  --  36.7  --   --  33.8*  --   --   PLT 341  --  304  --   --  272  --   --   HEPARINUNFRC  --    < >  --    < > 0.14*  --  0.13* 0.25*  CREATININE 1.29*  --  1.26*  --   --   --  1.33*  --    < > = values in this interval not displayed.    Estimated Creatinine Clearance: 42.8 mL/min (A) (by C-G formula based on SCr of 1.33 mg/dL (H)).  Assessment: 49 y.o. female with RUE DVT for heparin  Goal of Therapy:  Heparin level 0.3-0.7 units/ml Monitor platelets by anticoagulation protocol: Yes   Plan:  Increase Heparin 2100 units/hr Follow-up am labs.   Roshaun Pound, [place],  05/12/2019,12:41 AM

## 2019-05-12 NOTE — Progress Notes (Signed)
PROGRESS NOTE    Angelica Dunn  YQI:[id number] DOB: Jul 19, 1970 DOA: 05/06/2019 PCP: Renaldo Reel, PA   Brief Narrative:  HPI per Dr. Dana Allan on 05/06/19 Patient is a 49 year old lady with history of recurrent melanoma with lymph node involvement. Patient underwent right axillary lymph node dissection on March 31, 2019, as well as, placement of port for possible immunotherapy. Postop. Has been complicated by significantly elevated liver enzymes. Patient was referred to the GI team by the PCP, patient was seen earlier today. Liver enzymes remain significantly elevated. Additionally, patient has significant leukocytosis, with WBC of 39,000. Patient denies fever, chills, headache, neck pain, URI symptoms, chest pain, shortness of breath, GI symptoms or urinary symptoms. Abdominal ultrasound revealed biliary dilatation with common bile duct measuring 10 mm. Gallbladder was said to be mildly distended, but otherwise normal. No gallstones. Right-sided pleural effusion was incidentally noted, which is said to be new. Patient will be admitted for further assessment and management.  ED Course:On presentation to the hospital, vitals revealed temperature of 98.5, heart rate of 140 140 bpm, respiratory rate of 15, blood pressure of 110/74 mmHg and O2 sat of 99%. Patient has been pancultured by the ER providers. Patient will be started IV vancomycin and Zosyn.  Pertinent labs:CBC reveals WBC of 39, hemoglobin of 14.7, hematocrit of 44.3 with platelet count of 500. Test revealed ALT of 741, AST of 486, alkaline phosphatase of 225, albumin of 3.2, direct bilirubin of 1.5 with total bilirubin of 2 and total protein of 7.2.  Imaging:independently reviewed.  Interim History Patient was still complaining of some right axillary pain that was her main complaint is that her arm felt "heavy".  She thinks it is a little bit more swollen and heavier so we will obtain a upper  extremity duplex to rule out DVT as it did look a little bit more swollen.  The ultrasound did show an acute DVT involving the right brachial veins, right radial veins and the right ulnar veins and they were unable to visualize the cephalic and basilic veins.  She was started on anticoagulation with heparin drip and I have asked oncology for to weigh in on anticoagulation recommendations.  Dr. Burr Medico now recommends Eliquis at discharge as the patient is unwilling to give herself Lovenox injections  Patient was happy to learn that her LFTs are trending down along with her bilirubin slightly and increased from yesterday to today and are going up again.  She was started on IV fluids and diet given that no ERCP will be done .  General Surgery recommends continuing IV antibiotics at this time empirically I have stopped her vancomycin given her negative MRSA PCR.  Gastroenterology has now signed off as they felt that her elevated LFTs were secondary to drug-induced liver injury question if this is secondary to autoimmune parameters given that she has a positive ANA.  The day before yesterday the patient has also been complaining about some constipation and had a small bowel movement but will continue stool softeners and if necessary will try an enema.  Because her legs were somewhat swollen we also obtained a venous duplex which did not show any evidence of a DVT bilaterally.  Her leg swelling is secondary to her poor nutritional status and hypoalbuminemia so nutritionist has been consulted for further evaluation.  Oncology has also recommended the patient undergo evaluation for metastatic brain cancer given her melanoma and they have recommended MRI of the brain with and without contrast.  She did  have an acidosis and she was changed from normal saline to sodium bicarbonate and this is improving slightly.  Assessment & Plan:   Active Problems:   Leukocytosis   Melanoma of skin ([place])   DVT of right axillary  vein, acute ([facility])   Hypokalemia   Hypophosphatemia   Shortness of breath   Transaminitis   Hypercalcemia   Constipation   Normocytic anemia   1 leukocytosis with SIRS-like picture, POA in the setting of malignancy Patient admission noted to have a significant leukocytosis noted to be tachycardic with no source of infection noted.  White count was close to 40,000 on admission initially trending down but started to trend back up.  Patient pancultured with no growth to date.  MRI abdomen done negative for choledocholithiasis or biliary obstruction, no evidence of hepatic abscess or mass, small right pleural effusion right chest and abdominal wall edema, probable postop seroma in the right breast.  Patient currently afebrile.  Continue IV cefepime to complete a 7-day course of antibiotic treatment.  Subsequently discontinue IV cefepime after tomorrow's doses.  Leukocytosis may likely be related to malignancy.  Follow.  2.  Metastatic lymph node involvement associated malaise and fatigue Status post lymph node dissection.  Patient noted to have 7/19 lymph nodes positive for metastatic melanoma and breast lesion was complex sclerosing lesion.  Port in place.  Patient not started immunotherapy due to abnormalities.  Immunotherapy currently on hold due to abnormal LFTs and will be started in the outpatient setting.  GI team following on LFTs and recommending to monitor for now with no ERCP recommended at this time.  Patient will follow up with oncologist in [facility].  Patient was seen by medical oncology during this hospitalization and feel no inpatient treatment recommended at this time.  MRI brain negative for metastatic disease.  CT chest done negative for PE however did show extensive aggressive and progressive infiltrating tumor in the right axilla and invading the right chest wall and involving the right chest wall musculature.  New small right pleural effusion with overlying atelectasis.   No definite metastatic pulmonary nodules noted.  Outpatient follow-up with oncology.  3.  Right axillary pain secondary metastatic melanoma and to acute DVT Patient with complaints of right axillary pain with right upper extremity swelling.  Doppler ultrasound done positive for DVT.  Due to arm swelling and tightness vascular surgery consulted to rule out compartment syndrome.  It was felt patient did not have compartment syndrome at this time and if patient was to develop forearm numbness and tingling to reconsult for potential hand surgery fasciotomies.  Continue current pain management.  Continue anticoagulation with IV heparin.  4.  Acute right upper extremity DVT in the setting of malignancy and right upper extremity lymphedema Per Dopplers of the right upper extremity showing acute DVT involving right brachial veins, right radial veins, right ulnar veins unable to visualize cephalic and basilic veins.  Patient currently on IV heparin.  Dr. Burr Medico, oncology recommended lifelong anticoagulation with Lovenox on discharge and to continue heparin while inpatient.  Patient however uncomfortable to do self injections and as such Dr. Burr Medico recommends that most likely convenient therapy would be Eliquis and recommend starting at 10 mg twice daily for 7 days, then 5 mg twice daily thereafter.  Outpatient follow-up.  5.  Shortness of breath Patient with some complaints of shortness of breath per mother.  Could be secondary to recurrent melanoma with metastatic lymph node involvement.  CT chest which was  done was negative for PE however did show extensive aggressive and progressive infiltrating tumor in the right axilla.  Left upper extremity positive for DVT.  Patient currently on IV heparin.  Patient also noted to be on IV fluids early on in the hospitalization for hypercalcemia.  Approximately 10 L positive.  We will repeat chest x-ray in the morning.  Give a dose of Lasix 40 mg IV x1.  Strict I's and O's.  Daily  weights.  6.  Hypercalcemia Likely secondary to malignancy of melanoma.  Patient initially on IV fluids and change to sodium bicarb drip.  Status post IV Zometa x1.  Calcium went from 13 and currently at 8.9.  Follow.  7.  Thrombocytosis Improved.  Outpatient follow-up.  8.  Transaminitis/questionable autoimmune hepatitis/hyperbilirubinemia Patient noted to have LFTs now initially improving and fluctuating and started to trend back up.  LFTs trending back down this morning.  He with no evidence of biliary obstruction or choledocholithiasis.  No evidence of hepatic abscess or mass.  Mono screen was negative.  Alpha-1 antitrypsin serum level greater than 300.  Ceruloplasmin 44.  GI has assessed patient and recommends no plans for ERCP and continue empiric IV antibiotics.  GI feels likely drug-induced liver injury.  Concern for autoimmune etiology.  ANA positive and ANA pending was cytoplasmic fluorescence.  HCV quantitative less than 1.18.  HCV RNA PCR less than 15.  Will need outpatient follow-up per Dr. Teena Irani discussion with GI, Dr. Ardis Hughs.  9.  Hypokalemia/hypophosphatemia Replete.  10.  Normocytic anemia Patient with no overt bleeding.  Follow H&H.  Transfusion threshold hemoglobin less than 7.  11.  Constipation Continue current bowel regimen.  If no improvement will likely need a enema.  12.  Poor oral intake/low albumin Nutrition consulted.  Continue current nutritional supplementation.  DVT prophylaxis: Heparin Code Status: Full Family Communication: Updated patient and mother at bedside. Disposition Plan:  . Patient came from: Home           . Anticipated d/c place: Home . Barriers to d/c OR conditions which need to be met to effect a safe d/c: Likely home when work-up completed for significant leukocytosis with improvement in LFTs and transition to either subcutaneous Lovenox or NOAC.  Patient also with complaints of worsening shortness of breath.   Consultants:    Vascular surgery: Dr. Oneida Alar 05/10/2019  Oncology: Dr. Burr Medico 05/07/2019  General surgery: Dr. Dema Severin 05/07/2019    Procedures:   CT angiogram chest 05/07/2019  MRI brain 05/11/2019  MRI abdomen 05/07/2019  Abdominal ultrasound 05/06/2019  Lower extremity Dopplers 05/10/2019  Upper extremity Doppler 05/09/2019  Antimicrobials:   IV cefepime 05/07/2019>>>>>>   Subjective: Patient laying in bed complaining of nausea.  Patient with shortness of breath on minimal exertion per patient and mother..  Mother at bedside.  Patient also with complaints in the right upper extremity.  Objective: Vitals:   05/11/19 1307 05/11/19 2142 05/12/19 0503 05/12/19 1508  BP: 137/86 (!) 147/83 133/73 130/73  Pulse: (!) 112 (!) 122 (!) 124 (!) 115  Resp: 18 16 (!) 22 17  Temp: 97.8 F (36.6 C) 98 F (36.7 C) (!) 97.4 F (36.3 C) 97.8 F (36.6 C)  TempSrc: Oral Oral Oral Oral  SpO2: 100% 100% 100% 100%  Weight:      Height:        Intake/Output Summary (Last 24 hours) at 05/12/2019 2133 Last data filed at 05/12/2019 1849 Gross per 24 hour  Intake 1878.47 ml  Output --  Net 1878.47 ml   Filed Weights   05/07/19 0107  Weight: 57.1 kg    Examination:  General exam: Appears calm and comfortable  Respiratory system: Clear to auscultation no wheezes, no crackles, no rhonchi. Respiratory effort normal. Cardiovascular system: RRR. No JVD, murmurs, rubs, gallops or clicks. No pedal edema. Gastrointestinal system: Abdomen is nondistended, soft and nontender. No organomegaly or masses felt. Normal bowel sounds heard. Central nervous system: Alert and oriented. No focal neurological deficits. Extremities: Right upper extremity swelling decreasing.  Skin: No rashes, lesions or ulcers Psychiatry: Judgement and insight appear normal. Mood & affect appropriate.     Data Reviewed: I have personally reviewed following labs and imaging studies  CBC: Recent Labs  Lab 05/07/19 0148 05/07/19 1043  05/08/19 0935 05/09/19 0433 05/10/19 0343 05/11/19 0426 05/12/19 0355  WBC 34.9*   < > 30.5* 29.4* 33.7* 33.2* 33.9*  NEUTROABS 29.8*  --  29.0* 24.3* 26.9*  --  25.0*  HGB 13.2   < > 11.7* 11.4* 11.8* 11.0* 10.9*  HCT 41.7   < > 36.5 34.9* 36.7 33.8* 33.8*  MCV 92.5   < > 93.1 89.7 92.0 91.1 90.6  PLT 460*   < > 358 341 304 272 208   < > = values in this interval not displayed.   Basic Metabolic Panel: Recent Labs  Lab 05/08/19 0357 05/09/19 0433 05/10/19 0343 05/11/19 1302 05/12/19 0355  NA 141 139 140 142 141  K 3.0* 3.6 3.3* 2.7* 3.1*  CL 112* 114* 115* 114* 112*  CO2 22 17* 16* 19* 19*  GLUCOSE 102* 85 118* 146* 130*  BUN _0 CREATININE 1.42* 1.29* 1.26* 1.33* 1.25*  CALCIUM 11.8*  11.8* 9.7 9.1 8.8* 8.9  MG 1.7 1.9 1.9 1.9 1.9  PHOS 3.0 1.8* 2.3* 1.7* 2.2*   GFR: Estimated Creatinine Clearance: 45.5 mL/min (A) (by C-G formula based on SCr of 1.25 mg/dL (H)). Liver Function Tests: Recent Labs  Lab 05/08/19 0357 05/09/19 0433 05/10/19 0343 05/11/19 1302 05/12/19 0355  AST 87* 109* 150* 214* 125*  ALT 261* 209* 212* 239* 199*  ALKPHOS 150* 154* 175* 173* 177*  BILITOT 1.5* 1.2 2.0* 2.2* 2.4*  PROT 4.8* 4.8* 5.0* 4.8* 4.8*  ALBUMIN 1.8* 1.7* 1.7* 1.5* 1.4*   Recent Labs  Lab 05/07/19 0148  LIPASE 16   No results for input(s): AMMONIA in the last 168 hours. Coagulation Profile: Recent Labs  Lab 05/06/19 1056  INR 1.3*   Cardiac Enzymes: No results for input(s): CKTOTAL, CKMB, CKMBINDEX, TROPONINI in the last 168 hours. BNP (last 3 results) No results for input(s): PROBNP in the last 8760 hours. HbA1C: No results for input(s): HGBA1C in the last 72 hours. CBG: No results for input(s): GLUCAP in the last 168 hours. Lipid Profile: No results for input(s): CHOL, HDL, LDLCALC, TRIG, CHOLHDL, LDLDIRECT in the last 72 hours. Thyroid Function Tests: No results for input(s): TSH, T4TOTAL, FREET4, T3FREE, THYROIDAB in the last 72  hours. Anemia Panel: Recent Labs    05/10/19 0343  VITAMINB12 3,857*  FOLATE 12.5  FERRITIN 488*  TIBC 133*  IRON 27*  RETICCTPCT 2.3   Sepsis Labs: Recent Labs  Lab 05/07/19 0148  PROCALCITON 1.50    Recent Results (from the past 240 hour(s))  Blood culture (routine x 2)     Status: None   Collection Time: 05/06/19  9:42 PM   Specimen: BLOOD  Result Value Ref Range Status   Specimen Description BLOOD  LEFT ARM  Final   Special Requests   Final    BOTTLES DRAWN AEROBIC AND ANAEROBIC Blood Culture results may not be optimal due to an excessive volume of blood received in culture bottles   Culture   Final    NO GROWTH 5 DAYS Performed at [facility], [place] [address] [place], [place] [address]    Report Status 05/11/2019 FINAL  Final  Respiratory Panel by RT PCR (Flu A&B, Covid) - Nasopharyngeal Swab     Status: None   Collection Time: 05/06/19 10:35 PM   Specimen: Nasopharyngeal Swab  Result Value Ref Range Status   SARS Coronavirus 2 by RT PCR NEGATIVE NEGATIVE Final    Comment: (NOTE) SARS-CoV-2 target nucleic acids are NOT DETECTED. The SARS-CoV-2 RNA is generally detectable in upper respiratoy specimens during the acute phase of infection. The lowest concentration of SARS-CoV-2 viral copies this assay can detect is 131 copies/mL. A negative result does not preclude SARS-Cov-2 infection and should not be used as the sole basis for treatment or other patient management decisions. A negative result may occur with  improper specimen collection/handling, submission of specimen other than nasopharyngeal swab, presence of viral mutation(s) within the areas targeted by this assay, and inadequate number of viral copies (<131 copies/mL). A negative result must be combined with clinical observations, patient history, and epidemiological information. The expected result is Negative. Fact Sheet for Patients:  PinkCheek.be Fact Sheet for  Healthcare Providers:  GravelBags.it This test is not yet ap proved or cleared by the [place] FDA and  has been authorized for detection and/or diagnosis of SARS-CoV-2 by FDA under an Emergency Use Authorization (EUA). This EUA will remain  in effect (meaning this test can be used) for the duration of the COVID-19 declaration under Section 564(b)(1) of the Act, 21 U.S.C. section 360bbb-3(b)(1), unless the authorization is terminated or revoked sooner.    Influenza A by PCR NEGATIVE NEGATIVE Final   Influenza B by PCR NEGATIVE NEGATIVE Final    Comment: (NOTE) The Xpert Xpress SARS-CoV-2/FLU/RSV assay is intended as an aid in  the diagnosis of influenza from Nasopharyngeal swab specimens and  should not be used as a sole basis for treatment. Nasal washings and  aspirates are unacceptable for Xpert Xpress SARS-CoV-2/FLU/RSV  testing. Fact Sheet for Patients: PinkCheek.be Fact Sheet for Healthcare Providers: GravelBags.it This test is not yet approved or cleared by the [place] FDA and  has been authorized for detection and/or diagnosis of SARS-CoV-2 by  FDA under an Emergency Use Authorization (EUA). This EUA will remain  in effect (meaning this test can be used) for the duration of the  Covid-19 declaration under Section 564(b)(1) of the Act, 21  U.S.C. section 360bbb-3(b)(1), unless the authorization is  terminated or revoked. Performed at [facility], [place] [address], [place], [place] [address]   Culture, Urine     Status: None   Collection Time: 05/07/19  1:21 AM   Specimen: Urine, Random  Result Value Ref Range Status   Specimen Description URINE, RANDOM  Final   Special Requests NONE  Final   Culture   Final    NO GROWTH Performed at [facility], [place] [address], [place], [place] [address]    Report Status 05/08/2019 FINAL  Final  Blood culture (routine x 2)      Status: None   Collection Time: 05/07/19  3:13 AM   Specimen: BLOOD  Result Value Ref Range Status  Specimen Description BLOOD LEFT ARM  Final   Special Requests   Final    BOTTLES DRAWN AEROBIC ONLY Blood Culture adequate volume   Culture   Final    NO GROWTH 5 DAYS Performed at [facility], [address] [place], [place] [address]    Report Status 05/12/2019 FINAL  Final  MRSA PCR Screening     Status: None   Collection Time: 05/08/19  5:19 PM   Specimen: Nasopharyngeal  Result Value Ref Range Status   MRSA by PCR NEGATIVE NEGATIVE Final    Comment:        The GeneXpert MRSA Assay (FDA approved for NASAL specimens only), is one component of a comprehensive MRSA colonization surveillance program. It is not intended to diagnose MRSA infection nor to guide or monitor treatment for MRSA infections. Performed at [facility], [place] [address] [place],  [address]          Radiology Studies: MR BRAIN W WO CONTRAST  Result Date: 05/11/2019 CLINICAL DATA:  Melanoma, staging EXAM: MRI HEAD WITHOUT AND WITH CONTRAST TECHNIQUE: Multiplanar, multiecho pulse sequences of the brain and surrounding structures were obtained without and with intravenous contrast. CONTRAST:  16m GADAVIST GADOBUTROL 1 MMOL/ML IV SOLN COMPARISON:  None. FINDINGS: Brain: There is no acute infarction or intracranial hemorrhage. There is no intracranial mass, mass effect, or edema. There is no hydrocephalus or extra-axial fluid collection. No abnormal enhancement. Vascular: Major vessel flow voids at the skull base are preserved. Skull and upper cervical spine: No focal suspicious osseous lesion. Sinuses/Orbits: Paranasal sinuses are aerated. Orbits are unremarkable. Other: Sella is unremarkable. Minor left mastoid fluid opacification. IMPRESSION: No evidence of intracranial metastatic disease. Electronically Signed   By: PMacy MisM.D.   On: 05/11/2019 12:13        Scheduled  Meds: . Chlorhexidine Gluconate Cloth  6 each Topical Daily  . feeding supplement (ENSURE ENLIVE)  237 mL Oral BID BM  . furosemide  40 mg Intravenous Once  . loratadine  10 mg Oral Daily  . multivitamin with minerals  1 tablet Oral Daily  . polyethylene glycol  17 g Oral BID  . senna-docusate  1 tablet Oral BID  . sodium bicarbonate  650 mg Oral BID  . sodium chloride flush  10-40 mL Intracatheter Q12H   Continuous Infusions: . ceFEPime (MAXIPIME) IV Stopped (05/12/19 1235)  . heparin 2,350 Units/hr (05/12/19 2048)  .  sodium bicarbonate  infusion 1000 mL 75 mL/hr at 05/12/19 1849     LOS: 6 days    Time spent: 460mins    DIrine Seal MD [facility]   To contact the attending provider between 7A-7P or the covering provider during after hours 7P-7A, please log into the web site [email or web address] and access using universal [place] password for that web site. If you do not have the password, please call the hospital operator.  05/12/2019, 9:33 PM

## 2019-05-12 NOTE — Plan of Care (Signed)
  Problem: Education: Goal: Knowledge of General Education information will improve Description: Including pain rating scale, medication(s)/side effects and non-pharmacologic comfort measures Outcome: Progressing   Problem: Health Behavior/Discharge Planning: Goal: Ability to manage health-related needs will improve Outcome: Progressing   Problem: Clinical Measurements: Goal: Ability to maintain clinical measurements within normal limits will improve Outcome: Progressing Goal: Diagnostic test results will improve Outcome: Progressing Goal: Respiratory complications will improve Outcome: Progressing Goal: Cardiovascular complication will be avoided Outcome: Progressing   Problem: Activity: Goal: Risk for activity intolerance will decrease Outcome: Progressing   Problem: Elimination: Goal: Will not experience complications related to bowel motility Outcome: Progressing Goal: Will not experience complications related to urinary retention Outcome: Progressing   Problem: Pain Managment: Goal: General experience of comfort will improve Outcome: Progressing   Problem: Safety: Goal: Ability to remain free from injury will improve Outcome: Progressing   Problem: Skin Integrity: Goal: Risk for impaired skin integrity will decrease Outcome: Progressing

## 2019-05-12 NOTE — Progress Notes (Signed)
ANTICOAGULATION CONSULT NOTE - Follow Up Consult  Pharmacy Consult for Heparin Indication: DVT  Patient Measurements: Height: 5\' 3"  (160 cm) Weight: 125 lb 14.1 oz (57.1 kg) IBW/kg (Calculated) : 52.4 Heparin Dosing Weight: 57.1 kg  Vital Signs: Temp: 97.8 F (36.6 C) (03/17 1508) Temp Source: Oral (03/17 1508) BP: 130/73 (03/17 1508) Pulse Rate: 115 (03/17 1508)  Labs: Recent Labs    05/10/19 0343 05/10/19 1030 05/11/19 0136 05/11/19 0426 05/11/19 1302 05/11/19 1302 05/11/19 2211 05/12/19 0355 05/12/19 1024 05/12/19 1925  HGB 11.8*  --   --  11.0*  --   --   --  10.9*  --   --   HCT 36.7  --   --  33.8*  --   --   --  33.8*  --   --   PLT 304  --   --  272  --   --   --  208  --   --   HEPARINUNFRC  --    < >   < >  --  0.13*   < > 0.25*  --  0.25* 0.29*  CREATININE 1.26*  --   --   --  1.33*  --   --  1.25*  --   --    < > = values in this interval not displayed.    Estimated Creatinine Clearance: 45.5 mL/min (A) (by C-G formula based on SCr of 1.25 mg/dL (H)).  Assessment: Anticoag: IV heparin for RUE DVT per duplex 3/14. LE duplex negative 3/15. S/p R axillary lymph node dissection 03/31/19 and port placement.   HL 0.29 almost therapeutic on a high dose of 39.4 units/hr heparin after 3 days of dose increases and numerous bolus doses. Confirmed with RN no issues with infusion, no bleeding. Per note, planning for apixaban at discharge as patient is refusing enoxaparin injections.   Goal of Therapy:  Heparin level 0.3-0.7 units/ml Monitor platelets by anticoagulation protocol: Yes   Plan:  Heparin to 2350 units/hr.  Monitor daily HL, CBC/plt Monitor for signs/symptoms of bleeding  F/u transition to apixaban   Benetta Spar, PharmD, BCPS, [facility] Clinical Pharmacist  Please check AMION for all [place] phone numbers After 10:00 PM, call [place]

## 2019-05-12 NOTE — Progress Notes (Signed)
ANTICOAGULATION CONSULT NOTE - Follow Up Consult  Pharmacy Consult for Heparin Indication: DVT  Allergies  Allergen Reactions  . Gabapentin Other (See Comments)    "Made me feel odd, so I stopped taking it"    Patient Measurements: Height: 5\' 3"  (160 cm) Weight: 125 lb 14.1 oz (57.1 kg) IBW/kg (Calculated) : 52.4 Heparin Dosing Weight: 57.1 kg  Vital Signs: Temp: 97.4 F (36.3 C) (03/17 0503) Temp Source: Oral (03/17 0503) BP: 133/73 (03/17 0503) Pulse Rate: 124 (03/17 0503)  Labs: Recent Labs    05/10/19 0343 05/10/19 1030 05/11/19 0136 05/11/19 0426 05/11/19 1302 05/11/19 2211 05/12/19 0355 05/12/19 1024  HGB 11.8*  --   --  11.0*  --   --  10.9*  --   HCT 36.7  --   --  33.8*  --   --  33.8*  --   PLT 304  --   --  272  --   --  208  --   HEPARINUNFRC  --    < >   < >  --  0.13* 0.25*  --  0.25*  CREATININE 1.26*  --   --   --  1.33*  --  1.25*  --    < > = values in this interval not displayed.    Estimated Creatinine Clearance: 45.5 mL/min (A) (by C-G formula based on SCr of 1.25 mg/dL (H)).  Assessment: Anticoag: IV heparin for RUE DVT per duplex 3/14. LE duplex negative 3/15. S/p R axillary lymph node dissection 03/31/19 and port placement. HL 0.25 low. Hgb 10.9 slowly trending down. Plts 460(3/12)>208 today.   Goal of Therapy:  Heparin level 0.3-0.7 units/ml Monitor platelets by anticoagulation protocol: Yes   Plan:  IV heparin bolus 1000 units and increase to 2250 units/hr.  Recheck in 6-8 hrs. Daily HL and CBC   Jacarie Pate S. Alford Highland, PharmD, BCPS Clinical Staff Pharmacist [email or web address] Alford Highland, [place] 05/12/2019,11:28 AM

## 2019-05-13 ENCOUNTER — Inpatient Hospital Stay (HOSPITAL_COMMUNITY): Payer: Managed Care, Other (non HMO)

## 2019-05-13 DIAGNOSIS — I82A11 Acute embolism and thrombosis of right axillary vein: Secondary | ICD-10-CM

## 2019-05-13 LAB — CBC WITH DIFFERENTIAL/PLATELET
Abs Immature Granulocytes: 0.87 10*3/uL — ABNORMAL HIGH (ref 0.00–0.07)
Basophils Absolute: 0.2 10*3/uL — ABNORMAL HIGH (ref 0.0–0.1)
Basophils Relative: 1 %
Eosinophils Absolute: 0.3 10*3/uL (ref 0.0–0.5)
Eosinophils Relative: 1 %
HCT: 35.1 % — ABNORMAL LOW (ref 36.0–46.0)
Hemoglobin: 11.5 g/dL — ABNORMAL LOW (ref 12.0–15.0)
Immature Granulocytes: 2 %
Lymphocytes Relative: 8 %
Lymphs Abs: 3.2 10*3/uL (ref 0.7–4.0)
MCH: 29.2 pg (ref 26.0–34.0)
MCHC: 32.8 g/dL (ref 30.0–36.0)
MCV: 89.1 fL (ref 80.0–100.0)
Monocytes Absolute: 4.7 10*3/uL — ABNORMAL HIGH (ref 0.1–1.0)
Monocytes Relative: 12 %
Neutro Abs: 29.8 10*3/uL — ABNORMAL HIGH (ref 1.7–7.7)
Neutrophils Relative %: 76 %
Platelets: 203 10*3/uL (ref 150–400)
RBC: 3.94 MIL/uL (ref 3.87–5.11)
RDW: 14.8 % (ref 11.5–15.5)
WBC: 39.1 10*3/uL — ABNORMAL HIGH (ref 4.0–10.5)
nRBC: 0 % (ref 0.0–0.2)

## 2019-05-13 LAB — MAGNESIUM: Magnesium: 2.4 mg/dL (ref 1.7–2.4)

## 2019-05-13 LAB — COMPREHENSIVE METABOLIC PANEL
ALT: 176 U/L — ABNORMAL HIGH (ref 0–44)
AST: 91 U/L — ABNORMAL HIGH (ref 15–41)
Albumin: 1.6 g/dL — ABNORMAL LOW (ref 3.5–5.0)
Alkaline Phosphatase: 208 U/L — ABNORMAL HIGH (ref 38–126)
Anion gap: 8 (ref 5–15)
BUN: 8 mg/dL (ref 6–20)
CO2: 20 mmol/L — ABNORMAL LOW (ref 22–32)
Calcium: 8.6 mg/dL — ABNORMAL LOW (ref 8.9–10.3)
Chloride: 111 mmol/L (ref 98–111)
Creatinine, Ser: 1.25 mg/dL — ABNORMAL HIGH (ref 0.44–1.00)
GFR calc Af Amer: 59 mL/min — ABNORMAL LOW (ref >60)
GFR calc non Af Amer: 51 mL/min — ABNORMAL LOW (ref >60)
Glucose, Bld: 152 mg/dL — ABNORMAL HIGH (ref 70–99)
Potassium: 3.6 mmol/L (ref 3.5–5.1)
Sodium: 139 mmol/L (ref 135–145)
Total Bilirubin: 2.9 mg/dL — ABNORMAL HIGH (ref 0.3–1.2)
Total Protein: 5.3 g/dL — ABNORMAL LOW (ref 6.5–8.1)

## 2019-05-13 LAB — HEPARIN LEVEL (UNFRACTIONATED): Heparin Unfractionated: 0.36 IU/mL (ref 0.30–0.70)

## 2019-05-13 LAB — PHOSPHORUS: Phosphorus: 2.6 mg/dL (ref 2.5–4.6)

## 2019-05-13 MED ORDER — MORPHINE SULFATE 15 MG PO TABS: 15.0000 mg | ORAL_TABLET | ORAL | Status: DC | PRN

## 2019-05-13 MED ORDER — POTASSIUM CHLORIDE CRYS ER 20 MEQ PO TBCR
40.0000 meq | EXTENDED_RELEASE_TABLET | Freq: Once | ORAL | Status: AC
  Administered 2019-05-13: 40 meq via ORAL
  Filled 2019-05-13: qty 2

## 2019-05-13 MED ORDER — APIXABAN 5 MG PO TABS: 5.0000 mg | ORAL_TABLET | Freq: Two times a day (BID) | ORAL | Status: DC

## 2019-05-13 MED ORDER — MORPHINE SULFATE 15 MG PO TABS
15.0000 mg | ORAL_TABLET | ORAL | Status: DC | PRN
  Administered 2019-05-13 – 2019-05-14 (×4): 15 mg via ORAL
  Filled 2019-05-13 (×6): qty 1

## 2019-05-13 MED ORDER — HEPARIN SOD (PORK) LOCK FLUSH 100 UNIT/ML IV SOLN
500.0000 [IU] | INTRAVENOUS | Status: DC
  Filled 2019-05-13: qty 5

## 2019-05-13 MED ORDER — APIXABAN 5 MG PO TABS
10.0000 mg | ORAL_TABLET | Freq: Two times a day (BID) | ORAL | Status: DC
  Administered 2019-05-13 – 2019-05-14 (×2): 10 mg via ORAL
  Filled 2019-05-13 (×2): qty 2

## 2019-05-13 MED ORDER — HEPARIN SOD (PORK) LOCK FLUSH 100 UNIT/ML IV SOLN
500.0000 [IU] | INTRAVENOUS | Status: DC | PRN
  Administered 2019-05-13: 500 [IU]
  Filled 2019-05-13: qty 5

## 2019-05-13 MED ORDER — POLYETHYLENE GLYCOL 3350 17 G PO PACK: 17.0000 g | PACK | Freq: Every day | ORAL | Status: DC | PRN

## 2019-05-13 NOTE — Progress Notes (Signed)
ANTICOAGULATION CONSULT NOTE - Follow Up Consult  Pharmacy Consult for Heparin Indication: DVT  Allergies  Allergen Reactions  . Gabapentin Other (See Comments)    "Made me feel odd, so I stopped taking it"    Patient Measurements: Height: 5\' 3"  (160 cm) Weight: 144 lb 2.9 oz (65.4 kg) IBW/kg (Calculated) : 52.4 Heparin Dosing Weight: 57.1 kg  Vital Signs: Temp: 98.7 F (37.1 C) (03/18 1842) Temp Source: Oral (03/18 1842) BP: 122/79 (03/18 1842) Pulse Rate: 120 (03/18 1842)  Labs: Recent Labs    05/11/19 0136 05/11/19 0426 05/11/19 1302 05/11/19 2211 05/12/19 0355 05/12/19 1024 05/12/19 1925 05/13/19 0145  HGB   < > 11.0*  --   --  10.9*  --   --  11.5*  HCT  --  33.8*  --   --  33.8*  --   --  35.1*  PLT  --  272  --   --  208  --   --  203  HEPARINUNFRC   < >  --  0.13*   < >  --  0.25* 0.29* 0.36  CREATININE  --   --  1.33*  --  1.25*  --   --  1.25*   < > = values in this interval not displayed.    Estimated Creatinine Clearance: 50 mL/min (A) (by C-G formula based on SCr of 1.25 mg/dL (H)).  Assessment: Anticoag: IV heparin for RUE DVT per duplex 3/14. LE duplex negative 3/15. S/p R axillary lymph node dissection 03/31/19 and port placement. HL 0.36 finally in goal. Hgb 11.5. Plts 460(3/12)>203 today.  Asked to convert to apixaban  Plan:  Dc heparin apixaban 10 mg bid x 1 week 5 mg bid thereafter  Barth Kirks, PharmD, BCPS, BCCCP Clinical Pharmacist [phone]  Please check AMION for all [facility] numbers  05/13/2019 6:59 PM

## 2019-05-13 NOTE — Progress Notes (Signed)
PROGRESS NOTE    Angelica Dunn  YQI:[id number] DOB: Jul 19, 1970 DOA: 05/06/2019 PCP: Renaldo Reel, PA   Brief Narrative:  HPI per Dr. Dana Allan on 05/06/19 Patient is a 49 year old lady with history of recurrent melanoma with lymph node involvement. Patient underwent right axillary lymph node dissection on March 31, 2019, as well as, placement of port for possible immunotherapy. Postop. Has been complicated by significantly elevated liver enzymes. Patient was referred to the GI team by the PCP, patient was seen earlier today. Liver enzymes remain significantly elevated. Additionally, patient has significant leukocytosis, with WBC of 39,000. Patient denies fever, chills, headache, neck pain, URI symptoms, chest pain, shortness of breath, GI symptoms or urinary symptoms. Abdominal ultrasound revealed biliary dilatation with common bile duct measuring 10 mm. Gallbladder was said to be mildly distended, but otherwise normal. No gallstones. Right-sided pleural effusion was incidentally noted, which is said to be new. Patient will be admitted for further assessment and management.  ED Course:On presentation to the hospital, vitals revealed temperature of 98.5, heart rate of 140 140 bpm, respiratory rate of 15, blood pressure of 110/74 mmHg and O2 sat of 99%. Patient has been pancultured by the ER providers. Patient will be started IV vancomycin and Zosyn.  Pertinent labs:CBC reveals WBC of 39, hemoglobin of 14.7, hematocrit of 44.3 with platelet count of 500. Test revealed ALT of 741, AST of 486, alkaline phosphatase of 225, albumin of 3.2, direct bilirubin of 1.5 with total bilirubin of 2 and total protein of 7.2.  Imaging:independently reviewed.  Interim History Patient was still complaining of some right axillary pain that was her main complaint is that her arm felt "heavy".  She thinks it is a little bit more swollen and heavier so we will obtain a upper  extremity duplex to rule out DVT as it did look a little bit more swollen.  The ultrasound did show an acute DVT involving the right brachial veins, right radial veins and the right ulnar veins and they were unable to visualize the cephalic and basilic veins.  She was started on anticoagulation with heparin drip and I have asked oncology for to weigh in on anticoagulation recommendations.  Dr. Burr Medico now recommends Eliquis at discharge as the patient is unwilling to give herself Lovenox injections  Patient was happy to learn that her LFTs are trending down along with her bilirubin slightly and increased from yesterday to today and are going up again.  She was started on IV fluids and diet given that no ERCP will be done .  General Surgery recommends continuing IV antibiotics at this time empirically I have stopped her vancomycin given her negative MRSA PCR.  Gastroenterology has now signed off as they felt that her elevated LFTs were secondary to drug-induced liver injury question if this is secondary to autoimmune parameters given that she has a positive ANA.  The day before yesterday the patient has also been complaining about some constipation and had a small bowel movement but will continue stool softeners and if necessary will try an enema.  Because her legs were somewhat swollen we also obtained a venous duplex which did not show any evidence of a DVT bilaterally.  Her leg swelling is secondary to her poor nutritional status and hypoalbuminemia so nutritionist has been consulted for further evaluation.  Oncology has also recommended the patient undergo evaluation for metastatic brain cancer given her melanoma and they have recommended MRI of the brain with and without contrast.  She did  have an acidosis and she was changed from normal saline to sodium bicarbonate and this is improving slightly.  Assessment & Plan:   Active Problems:   Leukocytosis   Melanoma of skin ([place])   DVT of right axillary  vein, acute ([facility])   Hypokalemia   Hypophosphatemia   Shortness of breath   Transaminitis   Hypercalcemia   Constipation   Normocytic anemia   1 leukocytosis with SIRS-like picture, POA in the setting of malignancy Patient admission noted to have a significant leukocytosis noted to be tachycardic with no source of infection noted.  Likely secondary to malignancy.   White count was close to 40 on admission initially trending down but started to trend back up at 39.1.Marland Kitchen  Patient pancultured with no growth to date.  MRI abdomen done negative for choledocholithiasis or biliary obstruction, no evidence of hepatic abscess or mass, small right pleural effusion right chest and abdominal wall edema, probable postop seroma in the right breast.  Patient currently afebrile.  Continue IV cefepime through today and discontinue after today's dose to complete a 7-day course of antibiotic treatment.  No further antibiotics needed.  Outpatient follow-up with oncology.  2.  Metastatic lymph node involvement associated malaise and fatigue Status post lymph node dissection.  Patient noted to have 7/19 lymph nodes positive for metastatic melanoma and breast lesion was complex sclerosing lesion.  Port in place.  Patient not started immunotherapy due to abnormalities.  Immunotherapy currently on hold due to abnormal LFTs and will be started in the outpatient setting.  GI team following on LFTs and recommending to monitor for now with no ERCP recommended at this time.  Patient will follow up with oncologist in [facility].  Patient was seen by medical oncology during this hospitalization and feel no inpatient treatment recommended at this time.  MRI brain negative for metastatic disease.  CT chest done negative for PE however did show extensive aggressive and progressive infiltrating tumor in the right axilla and invading the right chest wall and involving the right chest wall musculature.  New small right pleural  effusion with overlying atelectasis.  No definite metastatic pulmonary nodules noted.  Dr. Burr Medico with oncology spoke with Bobby Rumpf, oncology at [facility] cancer center who plans to see patient early back next week to start her on immunotherapy and radiation at the cancer center.  Hopeful to discharge patient home tomorrow if pain under better control with further pain management per her oncologist.  Appreciate oncology input and recommendations.  Outpatient follow-up with oncology.  3.  Right axillary pain secondary metastatic melanoma and to acute DVT Patient with complaints of right axillary pain with right upper extremity swelling.  Doppler ultrasound done positive for DVT.  Due to arm swelling and tightness vascular surgery consulted to rule out compartment syndrome.  It was felt patient did not have compartment syndrome at this time and if patient was to develop forearm numbness and tingling to reconsult for potential hand surgery fasciotomies.  Patient states oxycodone not managing her pain significantly well with pain going from a 10 to a 7.  Will discontinue oxycodone and placed on MSIR 15 mg every 4 hours as needed breakthrough pain.  Will likely need further titration in the outpatient setting with her oncologist.  Transition from IV heparin to Eliquis.   4.  Acute right upper extremity DVT in the setting of malignancy and right upper extremity lymphedema Per Dopplers of the right upper extremity showing acute DVT involving right brachial veins,  right radial veins, right ulnar veins unable to visualize cephalic and basilic veins.  Patient currently on IV heparin.  Dr. Burr Medico, oncology recommended lifelong anticoagulation with Lovenox on discharge and to continue heparin while inpatient.  Patient however uncomfortable to do self injections and as such Dr. Burr Medico recommends that most likely convenient therapy would be Eliquis and recommend starting at 10 mg twice daily for 7 days, then 5 mg twice daily  thereafter.  We will transition from heparin to Eliquis per pharmacy.  Outpatient follow-up.  5.  Shortness of breath Patient with some complaints of shortness of breath per mother.  Could be secondary to recurrent melanoma with metastatic lymph node involvement.  CT chest which was done was negative for PE however did show extensive aggressive and progressive infiltrating tumor in the right axilla.  Left upper extremity positive for DVT.  Patient currently on IV heparin.  Patient also noted to be on IV fluids early on in the hospitalization for hypercalcemia.  Approximately 10 L positive.  Repeat chest x-ray with increased right basilar atelectasis or infiltrate noted with possible small pleural effusion.  Status post Lasix 40 mg IV x1 on 05/12/2019 with a urine output not properly recorded.  Strict I's and O's.  Daily weights.   6.  Hypercalcemia Likely secondary to malignancy of melanoma.  Patient initially on IV fluids and change to sodium bicarb drip.  Status post IV Zometa x1.  Calcium went from 13 and currently at 8.6.  Follow.  7.  Thrombocytosis Resolved.  Outpatient follow-up.   8.  Transaminitis/questionable autoimmune hepatitis/hyperbilirubinemia Patient noted to have LFTs now initially improving and fluctuating and started to trend back up.  LFTs continue to trend down.  MRCP with no evidence of biliary obstruction or choledocholithiasis.  No evidence of hepatic abscess or mass.  Mono screen was negative.  Alpha-1 antitrypsin serum level greater than 300.  Ceruloplasmin 44.  GI has assessed patient and recommends no plans for ERCP and continue empiric IV antibiotics.  GI feels likely drug-induced liver injury.  Concern for autoimmune etiology.  ANA positive and ANA pending was cytoplasmic fluorescence.  HCV quantitative less than 1.18.  HCV RNA PCR less than 15.  Will need outpatient follow-up per Dr. Teena Irani discussion with GI, Dr. Ardis Hughs.  9.   Hypokalemia/hypophosphatemia Repleted.  10.  Normocytic anemia Patient with no overt bleeding.  Follow H&H.  Transfusion threshold hemoglobin less than 7.  11.  Constipation Patient states having good results and does not want MiraLAX.  Change MiraLAX to as needed.  Continue Senokot-S twice daily.   12.  Poor oral intake/low albumin Nutrition consulted.  Continue current nutritional supplementation.  DVT prophylaxis: Heparin>>> Eliquis Code Status: Full Family Communication: Updated patient and mother at bedside. Disposition Plan:  . Patient came from: Home           . Anticipated d/c place: Home . Barriers to d/c OR conditions which need to be met to effect a safe d/c: Likely home when work-up completed for significant leukocytosis with improvement in LFTs and transition to either subcutaneous Lovenox or NOAC, improvement with pain management hopefully tomorrow.   Consultants:   Vascular surgery: Dr. Oneida Alar 05/10/2019  Oncology: Dr. Burr Medico 05/07/2019  General surgery: Dr. Dema Severin 05/07/2019    Procedures:   CT angiogram chest 05/07/2019  MRI brain 05/11/2019  MRI abdomen 05/07/2019  Abdominal ultrasound 05/06/2019  Lower extremity Dopplers 05/10/2019  Upper extremity Doppler 05/09/2019  Antimicrobials:   IV cefepime 05/07/2019>>>>>> 05/13/2019  Subjective: Patient laying in bed complains of right axillary pain and right upper extremity pain and swelling.  Patient states oxycodone helps with the pain decreases it from a 10 to a 7 however still in constant pain.  Some shortness of breath however some improvement over the past 24 hours.  Good urine output after dose of Lasix.  Mother at bedside.  Objective: Vitals:   05/12/19 1508 05/12/19 1915 05/13/19 0448 05/13/19 0500  BP: 130/73 128/70 121/71   Pulse: (!) 115 (!) 112 (!) 125   Resp: '17 18 20   '$ Temp: 97.8 F (36.6 C) 98 F (36.7 C) 97.9 F (36.6 C)   TempSrc: Oral Oral Oral   SpO2: 100% 98% 100%   Weight:    65.4  kg  Height:        Intake/Output Summary (Last 24 hours) at 05/13/2019 1003 Last data filed at 05/13/2019 0300 Gross per 24 hour  Intake 2887.5 ml  Output 750 ml  Net 2137.5 ml   Filed Weights   05/07/19 0107 05/13/19 0500  Weight: 57.1 kg 65.4 kg    Examination:  General exam: Appears calm and comfortable  Respiratory system: Lungs clear to auscultation bilaterally.  No wheezes, no crackles, no rhonchi.  Normal respiratory effort.  Cardiovascular system: RRR. No JVD, murmurs, rubs, gallops or clicks. No pedal edema. Gastrointestinal system: Abdomen is nondistended, soft and nontender. No organomegaly or masses felt. Normal bowel sounds heard. Central nervous system: Alert and oriented. No focal neurological deficits. Extremities: Right upper extremity swelling decreasing.  Skin: 2 small pimples noted medially to Port-A-Cath however Port-A-Cath area with no significant erythema or tenderness to palpation.  Psychiatry: Judgement and insight appear normal. Mood & affect appropriate.     Data Reviewed: I have personally reviewed following labs and imaging studies  CBC: Recent Labs  Lab 05/08/19 0935 05/08/19 0935 05/09/19 0433 05/10/19 0343 05/11/19 0426 05/12/19 0355 05/13/19 0145  WBC 30.5*   < > 29.4* 33.7* 33.2* 33.9* 39.1*  NEUTROABS 29.0*  --  24.3* 26.9*  --  25.0* 29.8*  HGB 11.7*   < > 11.4* 11.8* 11.0* 10.9* 11.5*  HCT 36.5   < > 34.9* 36.7 33.8* 33.8* 35.1*  MCV 93.1   < > 89.7 92.0 91.1 90.6 89.1  PLT 358   < > 341 304 272 208 203   < > = values in this interval not displayed.   Basic Metabolic Panel: Recent Labs  Lab 05/09/19 0433 05/10/19 0343 05/11/19 1302 05/12/19 0355 05/13/19 0145  NA 139 140 142 141 139  K 3.6 3.3* 2.7* 3.1* 3.6  CL 114* 115* 114* 112* 111  CO2 17* 16* 19* 19* 20*  GLUCOSE 85 118* 146* 130* 152*  BUN '15 12 9 7 8  '$ CREATININE 1.29* 1.26* 1.33* 1.25* 1.25*  CALCIUM 9.7 9.1 8.8* 8.9 8.6*  MG 1.9 1.9 1.9 1.9 2.4  PHOS 1.8*  2.3* 1.7* 2.2* 2.6   GFR: Estimated Creatinine Clearance: 50 mL/min (A) (by C-G formula based on SCr of 1.25 mg/dL (H)). Liver Function Tests: Recent Labs  Lab 05/09/19 0433 05/10/19 0343 05/11/19 1302 05/12/19 0355 05/13/19 0145  AST 109* 150* 214* 125* 91*  ALT 209* 212* 239* 199* 176*  ALKPHOS 154* 175* 173* 177* 208*  BILITOT 1.2 2.0* 2.2* 2.4* 2.9*  PROT 4.8* 5.0* 4.8* 4.8* 5.3*  ALBUMIN 1.7* 1.7* 1.5* 1.4* 1.6*   Recent Labs  Lab 05/07/19 0148  LIPASE 16   No results for input(s): AMMONIA  in the last 168 hours. Coagulation Profile: Recent Labs  Lab 05/06/19 1056  INR 1.3*   Cardiac Enzymes: No results for input(s): CKTOTAL, CKMB, CKMBINDEX, TROPONINI in the last 168 hours. BNP (last 3 results) No results for input(s): PROBNP in the last 8760 hours. HbA1C: No results for input(s): HGBA1C in the last 72 hours. CBG: No results for input(s): GLUCAP in the last 168 hours. Lipid Profile: No results for input(s): CHOL, HDL, LDLCALC, TRIG, CHOLHDL, LDLDIRECT in the last 72 hours. Thyroid Function Tests: No results for input(s): TSH, T4TOTAL, FREET4, T3FREE, THYROIDAB in the last 72 hours. Anemia Panel: No results for input(s): VITAMINB12, FOLATE, FERRITIN, TIBC, IRON, RETICCTPCT in the last 72 hours. Sepsis Labs: Recent Labs  Lab 05/07/19 0148  PROCALCITON 1.50    Recent Results (from the past 240 hour(s))  Blood culture (routine x 2)     Status: None   Collection Time: 05/06/19  9:42 PM   Specimen: BLOOD  Result Value Ref Range Status   Specimen Description BLOOD LEFT ARM  Final   Special Requests   Final    BOTTLES DRAWN AEROBIC AND ANAEROBIC Blood Culture results may not be optimal due to an excessive volume of blood received in culture bottles   Culture   Final    NO GROWTH 5 DAYS Performed at [facility], [place] [address], [place], [place] [address]    Report Status 05/11/2019 FINAL  Final  Respiratory Panel by RT PCR (Flu A&B, Covid) -  Nasopharyngeal Swab     Status: None   Collection Time: 05/06/19 10:35 PM   Specimen: Nasopharyngeal Swab  Result Value Ref Range Status   SARS Coronavirus 2 by RT PCR NEGATIVE NEGATIVE Final    Comment: (NOTE) SARS-CoV-2 target nucleic acids are NOT DETECTED. The SARS-CoV-2 RNA is generally detectable in upper respiratoy specimens during the acute phase of infection. The lowest concentration of SARS-CoV-2 viral copies this assay can detect is 131 copies/mL. A negative result does not preclude SARS-Cov-2 infection and should not be used as the sole basis for treatment or other patient management decisions. A negative result may occur with  improper specimen collection/handling, submission of specimen other than nasopharyngeal swab, presence of viral mutation(s) within the areas targeted by this assay, and inadequate number of viral copies (<131 copies/mL). A negative result must be combined with clinical observations, patient history, and epidemiological information. The expected result is Negative. Fact Sheet for Patients:  PinkCheek.be Fact Sheet for Healthcare Providers:  GravelBags.it This test is not yet ap proved or cleared by the [place] FDA and  has been authorized for detection and/or diagnosis of SARS-CoV-2 by FDA under an Emergency Use Authorization (EUA). This EUA will remain  in effect (meaning this test can be used) for the duration of the COVID-19 declaration under Section 564(b)(1) of the Act, 21 U.S.C. section 360bbb-3(b)(1), unless the authorization is terminated or revoked sooner.    Influenza A by PCR NEGATIVE NEGATIVE Final   Influenza B by PCR NEGATIVE NEGATIVE Final    Comment: (NOTE) The Xpert Xpress SARS-CoV-2/FLU/RSV assay is intended as an aid in  the diagnosis of influenza from Nasopharyngeal swab specimens and  should not be used as a sole basis for treatment. Nasal washings and  aspirates  are unacceptable for Xpert Xpress SARS-CoV-2/FLU/RSV  testing. Fact Sheet for Patients: PinkCheek.be Fact Sheet for Healthcare Providers: GravelBags.it This test is not yet approved or cleared by the [place] and  has been authorized  for detection and/or diagnosis of SARS-CoV-2 by  FDA under an Emergency Use Authorization (EUA). This EUA will remain  in effect (meaning this test can be used) for the duration of the  Covid-19 declaration under Section 564(b)(1) of the Act, 21  U.S.C. section 360bbb-3(b)(1), unless the authorization is  terminated or revoked. Performed at [facility], [place] [address] [place], [place] [address]   Culture, Urine     Status: None   Collection Time: 05/07/19  1:21 AM   Specimen: Urine, Random  Result Value Ref Range Status   Specimen Description URINE, RANDOM  Final   Special Requests NONE  Final   Culture   Final    NO GROWTH Performed at [facility], [place] [address] [place], [place] [address]    Report Status 05/08/2019 FINAL  Final  Blood culture (routine x 2)     Status: None   Collection Time: 05/07/19  3:13 AM   Specimen: BLOOD  Result Value Ref Range Status   Specimen Description BLOOD LEFT ARM  Final   Special Requests   Final    BOTTLES DRAWN AEROBIC ONLY Blood Culture adequate volume   Culture   Final    NO GROWTH 5 DAYS Performed at [facility], [place] [address], [place], [place] [address]    Report Status 05/12/2019 FINAL  Final  MRSA PCR Screening     Status: None   Collection Time: 05/08/19  5:19 PM   Specimen: Nasopharyngeal  Result Value Ref Range Status   MRSA by PCR NEGATIVE NEGATIVE Final    Comment:        The GeneXpert MRSA Assay (FDA approved for NASAL specimens only), is one component of a comprehensive MRSA colonization surveillance program. It is not intended to diagnose MRSA infection nor to guide or monitor treatment for MRSA  infections. Performed at [facility], [place] [address] [place], [place] [address]          Radiology Studies: MR BRAIN W WO CONTRAST  Result Date: 05/11/2019 CLINICAL DATA:  Melanoma, staging EXAM: MRI HEAD WITHOUT AND WITH CONTRAST TECHNIQUE: Multiplanar, multiecho pulse sequences of the brain and surrounding structures were obtained without and with intravenous contrast. CONTRAST:  97m GADAVIST GADOBUTROL 1 MMOL/ML IV SOLN COMPARISON:  None. FINDINGS: Brain: There is no acute infarction or intracranial hemorrhage. There is no intracranial mass, mass effect, or edema. There is no hydrocephalus or extra-axial fluid collection. No abnormal enhancement. Vascular: Major vessel flow voids at the skull base are preserved. Skull and upper cervical spine: No focal suspicious osseous lesion. Sinuses/Orbits: Paranasal sinuses are aerated. Orbits are unremarkable. Other: Sella is unremarkable. Minor left mastoid fluid opacification. IMPRESSION: No evidence of intracranial metastatic disease. Electronically Signed   By: PMacy MisM.D.   On: 05/11/2019 12:13   DG CHEST PORT 1 VIEW  Result Date: 05/13/2019 CLINICAL DATA:  Shortness of breath. EXAM: PORTABLE CHEST 1 VIEW COMPARISON:  March 31, 2019. FINDINGS: The heart size and mediastinal contours are within normal limits. No pneumothorax. Left lung is clear. Left subclavian Port-A-Cath is noted. Increased right basilar atelectasis or infiltrate is noted with possible small pleural effusion. The visualized skeletal structures are unremarkable. IMPRESSION: Increased right basilar atelectasis or infiltrate is noted with possible small pleural effusion. Electronically Signed   By: JMarijo ConceptionM.D.   On: 05/13/2019 08:20        Scheduled Meds: . Chlorhexidine Gluconate Cloth  6 each Topical  Daily  . feeding supplement (ENSURE ENLIVE)  237 mL Oral BID BM  . loratadine  10 mg Oral Daily  . multivitamin with minerals  1 tablet Oral Daily  .  polyethylene glycol  17 g Oral BID  . senna-docusate  1 tablet Oral BID  . sodium bicarbonate  650 mg Oral BID  . sodium chloride flush  10-40 mL Intracatheter Q12H   Continuous Infusions: . ceFEPime (MAXIPIME) IV Stopped (05/13/19 0049)  . heparin 2,350 Units/hr (05/13/19 0300)     LOS: 7 days    Time spent: 40 mins    Irine Seal, MD [facility]   To contact the attending provider between 7A-7P or the covering provider during after hours 7P-7A, please log into the web site [email or web address] and access using universal [place] password for that web site. If you do not have the password, please call the hospital operator.  05/13/2019, 10:03 AM

## 2019-05-13 NOTE — Progress Notes (Addendum)
Angelica Dunn   DOB:12-04-1970   [id number]   DH:[id number]  Oncology follow up   Subjective: Continues to have right upper extremity edema and pain.  She is also still having significant pain in her right axilla.  Feels as though the skin surrounding her axilla is "tight."  Remains afebrile.  Still having fatigue.  No significant headaches, vision changes, or neurological symptoms.  Appetite is fair.  Objective:  Vitals:   05/12/19 1915 05/13/19 0448  BP: 128/70 121/71  Pulse: (!) 112 (!) 125  Resp: 18 20  Temp: 98 F (36.7 C) 97.9 F (36.6 C)  SpO2: 98% 100%    Body mass index is 25.54 kg/m.  Intake/Output Summary (Last 24 hours) at 05/13/2019 1426 Last data filed at 05/13/2019 1117 Gross per 24 hour  Intake 2597.5 ml  Output 750 ml  Net 1847.5 ml     Sclerae unicteric  Oropharynx clear  Lungs clear -- no rales or rhonchi  Heart regular rate and rhythm  Abdomen benign  (+) Right upper extremity edema, including hand, no edema in left arm or LEs  CBG (last 3)  No results for input(s): GLUCAP in the last 72 hours.   Labs:  Urine Studies No results for input(s): UHGB, CRYS in the last 72 hours.  Invalid input(s): UACOL, UAPR, USPG, UPH, UTP, UGL, UKET, UBIL, UNIT, UROB, ULEU, UEPI, UWBC, Junie Panning Dresser, [place], [place]  Basic Metabolic Panel: Recent Labs  Lab 05/09/19 0433 05/09/19 0433 05/10/19 0343 05/10/19 0343 05/11/19 1302 05/11/19 1302 05/12/19 0355 05/13/19 0145  NA 139  --  140  --  142  --  141 139  K 3.6   < > 3.3*   < > 2.7*   < > 3.1* 3.6  CL 114*  --  115*  --  114*  --  112* 111  CO2 17*  --  16*  --  19*  --  19* 20*  GLUCOSE 85  --  118*  --  146*  --  130* 152*  BUN 15  --  12  --  9  --  7 8  CREATININE 1.29*  --  1.26*  --  1.33*  --  1.25* 1.25*  CALCIUM 9.7  --  9.1  --  8.8*  --  8.9 8.6*  MG 1.9  --  1.9  --  1.9  --  1.9 2.4  PHOS 1.8*  --  2.3*  --  1.7*  --  2.2* 2.6   < > = values in this interval not displayed.    GFR Estimated Creatinine Clearance: 50 mL/min (A) (by C-G formula based on SCr of 1.25 mg/dL (H)). Liver Function Tests: Recent Labs  Lab 05/09/19 0433 05/10/19 0343 05/11/19 1302 05/12/19 0355 05/13/19 0145  AST 109* 150* 214* 125* 91*  ALT 209* 212* 239* 199* 176*  ALKPHOS 154* 175* 173* 177* 208*  BILITOT 1.2 2.0* 2.2* 2.4* 2.9*  PROT 4.8* 5.0* 4.8* 4.8* 5.3*  ALBUMIN 1.7* 1.7* 1.5* 1.4* 1.6*   Recent Labs  Lab 05/07/19 0148  LIPASE 16   No results for input(s): AMMONIA in the last 168 hours. Coagulation profile No results for input(s): INR, PROTIME in the last 168 hours.  CBC: Recent Labs  Lab 05/08/19 0935 05/08/19 0935 05/09/19 0433 05/10/19 0343 05/11/19 0426 05/12/19 0355 05/13/19 0145  WBC 30.5*   < > 29.4* 33.7* 33.2* 33.9* 39.1*  NEUTROABS 29.0*  --  24.3* 26.9*  --  25.0*  29.8*  HGB 11.7*   < > 11.4* 11.8* 11.0* 10.9* 11.5*  HCT 36.5   < > 34.9* 36.7 33.8* 33.8* 35.1*  MCV 93.1   < > 89.7 92.0 91.1 90.6 89.1  PLT 358   < > 341 304 272 208 203   < > = values in this interval not displayed.   Cardiac Enzymes: No results for input(s): CKTOTAL, CKMB, CKMBINDEX, TROPONINI in the last 168 hours. BNP: Invalid input(s): POCBNP CBG: No results for input(s): GLUCAP in the last 168 hours. D-Dimer No results for input(s): DDIMER in the last 72 hours. Hgb A1c No results for input(s): HGBA1C in the last 72 hours. Lipid Profile No results for input(s): CHOL, HDL, LDLCALC, TRIG, CHOLHDL, LDLDIRECT in the last 72 hours. Thyroid function studies No results for input(s): TSH, T4TOTAL, T3FREE, THYROIDAB in the last 72 hours.  Invalid input(s): FREET3 Anemia work up No results for input(s): VITAMINB12, FOLATE, FERRITIN, TIBC, IRON, RETICCTPCT in the last 72 hours. Microbiology Recent Results (from the past 240 hour(s))  Blood culture (routine x 2)     Status: None   Collection Time: 05/06/19  9:42 PM   Specimen: BLOOD  Result Value Ref Range Status    Specimen Description BLOOD LEFT ARM  Final   Special Requests   Final    BOTTLES DRAWN AEROBIC AND ANAEROBIC Blood Culture results may not be optimal due to an excessive volume of blood received in culture bottles   Culture   Final    NO GROWTH 5 DAYS Performed at [facility], [place] [address] [place], [place] [address]    Report Status 05/11/2019 FINAL  Final  Respiratory Panel by RT PCR (Flu A&B, Covid) - Nasopharyngeal Swab     Status: None   Collection Time: 05/06/19 10:35 PM   Specimen: Nasopharyngeal Swab  Result Value Ref Range Status   SARS Coronavirus 2 by RT PCR NEGATIVE NEGATIVE Final    Comment: (NOTE) SARS-CoV-2 target nucleic acids are NOT DETECTED. The SARS-CoV-2 RNA is generally detectable in upper respiratoy specimens during the acute phase of infection. The lowest concentration of SARS-CoV-2 viral copies this assay can detect is 131 copies/mL. A negative result does not preclude SARS-Cov-2 infection and should not be used as the sole basis for treatment or other patient management decisions. A negative result may occur with  improper specimen collection/handling, submission of specimen other than nasopharyngeal swab, presence of viral mutation(s) within the areas targeted by this assay, and inadequate number of viral copies (<131 copies/mL). A negative result must be combined with clinical observations, patient history, and epidemiological information. The expected result is Negative. Fact Sheet for Patients:  PinkCheek.be Fact Sheet for Healthcare Providers:  GravelBags.it This test is not yet ap proved or cleared by the [place] FDA and  has been authorized for detection and/or diagnosis of SARS-CoV-2 by FDA under an Emergency Use Authorization (EUA). This EUA will remain  in effect (meaning this test can be used) for the duration of the COVID-19 declaration under Section 564(b)(1) of the Act,  21 U.S.C. section 360bbb-3(b)(1), unless the authorization is terminated or revoked sooner.    Influenza A by PCR NEGATIVE NEGATIVE Final   Influenza B by PCR NEGATIVE NEGATIVE Final    Comment: (NOTE) The Xpert Xpress SARS-CoV-2/FLU/RSV assay is intended as an aid in  the diagnosis of influenza from Nasopharyngeal swab specimens and  should not be used as a sole basis for treatment. Nasal washings and  aspirates  are unacceptable for Xpert Xpress SARS-CoV-2/FLU/RSV  testing. Fact Sheet for Patients: PinkCheek.be Fact Sheet for Healthcare Providers: GravelBags.it This test is not yet approved or cleared by the [place] FDA and  has been authorized for detection and/or diagnosis of SARS-CoV-2 by  FDA under an Emergency Use Authorization (EUA). This EUA will remain  in effect (meaning this test can be used) for the duration of the  Covid-19 declaration under Section 564(b)(1) of the Act, 21  U.S.C. section 360bbb-3(b)(1), unless the authorization is  terminated or revoked. Performed at [facility], [place] [address], [place], [place] [address]   Culture, Urine     Status: None   Collection Time: 05/07/19  1:21 AM   Specimen: Urine, Random  Result Value Ref Range Status   Specimen Description URINE, RANDOM  Final   Special Requests NONE  Final   Culture   Final    NO GROWTH Performed at [facility], [place] [address], [place], [place] [address]    Report Status 05/08/2019 FINAL  Final  Blood culture (routine x 2)     Status: None   Collection Time: 05/07/19  3:13 AM   Specimen: BLOOD  Result Value Ref Range Status   Specimen Description BLOOD LEFT ARM  Final   Special Requests   Final    BOTTLES DRAWN AEROBIC ONLY Blood Culture adequate volume   Culture   Final    NO GROWTH 5 DAYS Performed at [facility], [place] [address], [place], [place] [address]    Report Status 05/12/2019 FINAL  Final  MRSA  PCR Screening     Status: None   Collection Time: 05/08/19  5:19 PM   Specimen: Nasopharyngeal  Result Value Ref Range Status   MRSA by PCR NEGATIVE NEGATIVE Final    Comment:        The GeneXpert MRSA Assay (FDA approved for NASAL specimens only), is one component of a comprehensive MRSA colonization surveillance program. It is not intended to diagnose MRSA infection nor to guide or monitor treatment for MRSA infections. Performed at [facility], [place] [address], [place], [place] [address]       Studies:  DG CHEST PORT 1 VIEW  Result Date: 05/13/2019 CLINICAL DATA:  Shortness of breath. EXAM: PORTABLE CHEST 1 VIEW COMPARISON:  March 31, 2019. FINDINGS: The heart size and mediastinal contours are within normal limits. No pneumothorax. Left lung is clear. Left subclavian Port-A-Cath is noted. Increased right basilar atelectasis or infiltrate is noted with possible small pleural effusion. The visualized skeletal structures are unremarkable. IMPRESSION: Increased right basilar atelectasis or infiltrate is noted with possible small pleural effusion. Electronically Signed   By: Marijo Conception M.D.   On: 05/13/2019 08:20    Assessment: 49 y.o.   1.  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  New RUE DVT 3.  Leukocytosis 4.  Elevated LFTs and total bilirubin 5.  Hypercalcemia, resolved  6.  Hypertension 7.  Hyperlipidemia  Plan:  -her right UE DVT is related to her underlying malignancy, in the setting of right arm lymphedema.  She will need lifelong anticoagulation.  Continue heparin during her hospitalization.  Anticoagulation options have previously been discussed with her.  She is not interested in Lovenox as she is not comfortable doing self injections.  Recommend Eliquis 10 mg twice daily for 1 week and then 5 mg twice daily maintenance. -if all ID work  up is negative, I think her leukocytosis could be related to her underlying  malignancy.  Due to her rapid growth of her melanoma, she needs start treatment as soon as possible after discharge.  It sounds as though she may be discharged in the next 1 to 2 days. -Dr. Bobby Rumpf will resume care after her discharge, I will inform him when she is ready to leave hospital.   Mikey Bussing, NP 05/13/2019  2:26 PM  Addendum  I have seen the patient, examined her. I agree with the assessment and and plan and have edited the notes.   Her ID work-up has been negative so far, her persistent leukocytosis is likely related to her malignancy, OK to discharge from our stand point, I spoke with pt, Dr. Marcello Moores and Dr. Bobby Rumpf this afternoon, plan to discharge her home tomorrow or Saturday, Dr. Bobby Rumpf will see her back early next week, and start her on immunotherapy and radiation at [facility].  She does have limited social support, will arrange home care service and bedside commode, she has a shower chair and walker at home.   Truitt Merle  05/13/2019

## 2019-05-13 NOTE — Progress Notes (Signed)
Noted two individual pimple type areas under the [facility] dressing approximately one inch from the needle insertion site.  Both spots appear to have purulent drainage present.  Bernie RN and Dr. Grandville Silos notified of areas. Pt also aware.

## 2019-05-13 NOTE — Progress Notes (Signed)
Transitions of Care Pharmacist Note  Angelica Dunn is a 49 y.o. female that has been diagnosed with DVT and will be prescribed Eliquis (apixaban) at discharge.   Patient Education: I provided the following education on 3/18 to the patient: How to take the medication Described what the medication is Signs of bleeding Signs/symptoms of VTE and stroke  Answered their questions  Discharge Medications Plan: The patient wants to have their discharge medications filled by the Transitions of Care pharmacy rather than their usual pharmacy.  The discharge orders pharmacy has been changed to the Transitions of Care pharmacy, the patient will receive a phone call regarding co-pay, and their medications will be delivered by the Transitions of Care pharmacy.   Per Case Manager - Eliquis has a zero co-pay and no prior authorization  Thank you,   Acey Lav, PharmD  PGY1 Acute Care Pharmacy Resident May 13, 2019

## 2019-05-13 NOTE — Progress Notes (Signed)
PAC deaccessed as per securechat with Dr Grandville Silos.  Site cleaned and has no signs of infection. Once clear dressing from [place] removed, noted multiple  "pimple" areas.  All areas cleaned separately from [facility] insertion site with 2 CHG.  All areas left open to air with slight bleeding noted at sites. Bernie RN and Dr Grandville Silos notified of findings and intervention.

## 2019-05-13 NOTE — TOC Initial Note (Signed)
Transition of Care [facility]) - Initial/Assessment Note    Patient Details  Name: Angelica Dunn MRN: [id number] Date of Birth: 1970-12-05  Transition of Care [facility]) CM/SW Contact:    Marilu Favre, RN Phone Number: 05/13/2019, 2:40 PM  Clinical Narrative:                 Spoke to patient and mother at bedside. Explained Eliquis has zero co pay and no prior authorization needed.    Expected Discharge Plan: Home/Self Care Barriers to Discharge: Continued Medical Work up   Patient Goals and CMS Choice Patient states their goals for this hospitalization and ongoing recovery are:: to return to home CMS Medicare.gov Compare Post Acute Care list provided to:: Patient Choice offered to / list presented to : NA  Expected Discharge Plan and Services Expected Discharge Plan: Home/Self Care       Living arrangements for the past 2 months: Single Family Home                 DME Arranged: N/A         HH Arranged: NA          Prior Living Arrangements/Services Living arrangements for the past 2 months: Single Family Home   Patient language and need for interpreter reviewed:: Yes Do you feel safe going back to the place where you live?: Yes      Need for Family Participation in Patient Care: Yes (Comment) Care giver support system in place?: Yes (comment)   Criminal Activity/Legal Involvement Pertinent to Current Situation/Hospitalization: No - Comment as needed  Activities of Daily Living Home Assistive Devices/Equipment: None ADL Screening (condition at time of admission) Patient's cognitive ability adequate to safely complete daily activities?: Yes Is the patient deaf or have difficulty hearing?: No Does the patient have difficulty seeing, even when wearing glasses/contacts?: No Does the patient have difficulty concentrating, remembering, or making decisions?: No Patient able to express need for assistance with ADLs?: No Does the patient have difficulty  dressing or bathing?: No Independently performs ADLs?: No Does the patient have difficulty walking or climbing stairs?: No Weakness of Legs: None Weakness of Arms/Hands: None  Permission Sought/Granted      Share Information with NAME: mother           Emotional Assessment Appearance:: Appears stated age Attitude/Demeanor/Rapport: Engaged Affect (typically observed): Accepting Orientation: : Oriented to Situation, Oriented to  Time, Oriented to Place, Oriented to Self Alcohol / Substance Use: Not Applicable    Admission diagnosis:  Leukocytosis [D72.829] Common bile duct dilatation [K83.8] Abdominal pain, RUQ [R10.11] Patient Active Problem List   Diagnosis Date Noted  . DVT of right axillary vein, acute ([place])   . Hypokalemia   . Hypophosphatemia   . Shortness of breath   . Transaminitis   . Hypercalcemia   . Constipation   . Normocytic anemia   . Melanoma of skin ([place])   . Leukocytosis 05/06/2019   PCP:  Renaldo Reel, PA Pharmacy:   [place], [place] [address] Phone: [phone] Fax: [place], [place] - [address] [place] [address] Phone: [phone] Fax: [phone]     Social Determinants of Health (SDOH) Interventions    Readmission Risk Interventions No flowsheet data found.

## 2019-05-13 NOTE — Progress Notes (Signed)
ANTICOAGULATION CONSULT NOTE - Follow Up Consult  Pharmacy Consult for Heparin Indication: DVT  Allergies  Allergen Reactions  . Gabapentin Other (See Comments)    "Made me feel odd, so I stopped taking it"    Patient Measurements: Height: 5\' 3"  (160 cm) Weight: 144 lb 2.9 oz (65.4 kg) IBW/kg (Calculated) : 52.4 Heparin Dosing Weight: 57.1 kg  Vital Signs: Temp: 97.9 F (36.6 C) (03/18 0448) Temp Source: Oral (03/18 0448) BP: 121/71 (03/18 0448) Pulse Rate: 125 (03/18 0448)  Labs: Recent Labs    05/11/19 0136 05/11/19 0426 05/11/19 1302 05/11/19 2211 05/12/19 0355 05/12/19 1024 05/12/19 1925 05/13/19 0145  HGB   < > 11.0*  --   --  10.9*  --   --  11.5*  HCT  --  33.8*  --   --  33.8*  --   --  35.1*  PLT  --  272  --   --  208  --   --  203  HEPARINUNFRC   < >  --  0.13*   < >  --  0.25* 0.29* 0.36  CREATININE  --   --  1.33*  --  1.25*  --   --  1.25*   < > = values in this interval not displayed.    Estimated Creatinine Clearance: 50 mL/min (A) (by C-G formula based on SCr of 1.25 mg/dL (H)).  Assessment: Anticoag: IV heparin for RUE DVT per duplex 3/14. LE duplex negative 3/15. S/p R axillary lymph node dissection 03/31/19 and port placement. HL 0.36 finally in goal. Hgb 11.5. Plts 460(3/12)>203 today.   Goal of Therapy:  Heparin level 0.3-0.7 units/ml Monitor platelets by anticoagulation protocol: Yes   Plan:  IV heparin 2350 units/hr Daily HL and CBC D/c Cefepime after today's doses.  Florita Nitsch S. Alford Highland, PharmD, BCPS Clinical Staff Pharmacist [email or web address] Alford Highland, Terrie Haring Stillinger 05/13/2019,9:07 AM

## 2019-05-14 LAB — COMPREHENSIVE METABOLIC PANEL
ALT: 119 U/L — ABNORMAL HIGH (ref 0–44)
AST: 64 U/L — ABNORMAL HIGH (ref 15–41)
Albumin: 1.6 g/dL — ABNORMAL LOW (ref 3.5–5.0)
Alkaline Phosphatase: 218 U/L — ABNORMAL HIGH (ref 38–126)
Anion gap: 10 (ref 5–15)
BUN: 9 mg/dL (ref 6–20)
CO2: 18 mmol/L — ABNORMAL LOW (ref 22–32)
Calcium: 8.9 mg/dL (ref 8.9–10.3)
Chloride: 112 mmol/L — ABNORMAL HIGH (ref 98–111)
Creatinine, Ser: 1.28 mg/dL — ABNORMAL HIGH (ref 0.44–1.00)
GFR calc Af Amer: 57 mL/min — ABNORMAL LOW (ref >60)
GFR calc non Af Amer: 49 mL/min — ABNORMAL LOW (ref >60)
Glucose, Bld: 99 mg/dL (ref 70–99)
Potassium: 4 mmol/L (ref 3.5–5.1)
Sodium: 140 mmol/L (ref 135–145)
Total Bilirubin: 5.2 mg/dL — ABNORMAL HIGH (ref 0.3–1.2)
Total Protein: 5.6 g/dL — ABNORMAL LOW (ref 6.5–8.1)

## 2019-05-14 LAB — CBC
HCT: 37.4 % (ref 36.0–46.0)
Hemoglobin: 12.1 g/dL (ref 12.0–15.0)
MCH: 29.3 pg (ref 26.0–34.0)
MCHC: 32.4 g/dL (ref 30.0–36.0)
MCV: 90.6 fL (ref 80.0–100.0)
Platelets: 222 10*3/uL (ref 150–400)
RBC: 4.13 MIL/uL (ref 3.87–5.11)
RDW: 15.8 % — ABNORMAL HIGH (ref 11.5–15.5)
WBC: 44.2 10*3/uL — ABNORMAL HIGH (ref 4.0–10.5)
nRBC: 0.1 % (ref 0.0–0.2)

## 2019-05-14 MED ORDER — POLYETHYLENE GLYCOL 3350 17 G PO PACK: 17.0000 g | PACK | Freq: Every day | ORAL | 0 refills | Status: DC | PRN

## 2019-05-14 MED ORDER — SENNOSIDES-DOCUSATE SODIUM 8.6-50 MG PO TABS: 1.0000 | ORAL_TABLET | Freq: Two times a day (BID) | ORAL | 0 refills | Status: DC

## 2019-05-14 MED ORDER — MORPHINE SULFATE 15 MG PO TABS
15.0000 mg | ORAL_TABLET | ORAL | Status: DC | PRN
  Administered 2019-05-14: 15 mg via ORAL
  Filled 2019-05-14: qty 1

## 2019-05-14 MED ORDER — ENSURE ENLIVE PO LIQD: 237.0000 mL | Freq: Two times a day (BID) | ORAL | 12 refills | Status: DC

## 2019-05-14 MED ORDER — APIXABAN 5 MG PO TABS: 5.0000 mg | ORAL_TABLET | Freq: Two times a day (BID) | ORAL | 1 refills | Status: DC

## 2019-05-14 MED ORDER — EZETIMIBE 10 MG PO TABS: 10.0000 mg | ORAL_TABLET | Freq: Every day | ORAL | Status: DC

## 2019-05-14 MED ORDER — MORPHINE SULFATE 15 MG PO TABS: 7.5000 mg | ORAL_TABLET | ORAL | 0 refills | Status: DC | PRN

## 2019-05-14 MED ORDER — ADULT MULTIVITAMIN W/MINERALS CH: 1.0000 | ORAL_TABLET | Freq: Every day | ORAL | Status: DC

## 2019-05-14 MED ORDER — APIXABAN 5 MG PO TABS: 10.0000 mg | ORAL_TABLET | Freq: Two times a day (BID) | ORAL | 0 refills | Status: DC

## 2019-05-14 MED ORDER — MORPHINE SULFATE 15 MG PO TABS
7.5000 mg | ORAL_TABLET | ORAL | Status: DC | PRN
  Administered 2019-05-14: 7.5 mg via ORAL
  Filled 2019-05-14: qty 1

## 2019-05-14 MED FILL — SENEXON-S 8.6-50 MG TABS: 8.6-50 | 30 days supply | Qty: 60 | Fill #0

## 2019-05-14 MED FILL — POLYETHYLENE GLYCOL 3350 PO: 17 | 14 days supply | Qty: 238 | Fill #0

## 2019-05-14 MED FILL — ELIQUIS 5 MG TABLET: 5 | 30 days supply | Qty: 60 | Fill #0

## 2019-05-14 MED FILL — MORPHINE SULFATE 15 MG TABS: 15 | 2 days supply | Qty: 10 | Fill #0

## 2019-05-14 MED FILL — ELIQUIS STARTER PACK 5 MG T: 5 | 30 days supply | Qty: 74 | Fill #0

## 2019-05-14 NOTE — Evaluation (Addendum)
Occupational Therapy Evaluation Patient Details Name: Angelica Dunn MRN: [id number] DOB: 09-05-1970 Today's Date: 05/14/2019    History of Present Illness Pt is a 49 y/o female with hx of recurrent melanoma with lymph node involvement. S/P R axillary lymph node dissection and placement of port for possible immunotherapy 0000000, post op complicated by elevated liver enzymes. Found with acute R UE DVT. Negative DVT BLE imaging. MRI head negative for metastatic disease.    Clinical Impression   Patient admitted for above and limited by problem list below, including R UE pain (shoulder) and decreased functional ROM, weakness and coordination (R hand dominant), impaired activity tolerance.  She reports independent prior to surgery on 2/3, but since has required increased assist for ADLs.  She currently requires min-max assist for ADLs, min assist for bed mobility and min assist for stand pivot transfers to [facility].  Educated on elevated on R UE, use of R UE for simple functional tasks (with shoulder supported for pain mgmt), and importance of movement of UE to avoid stiffness and reduce edema--pt with no shoulder active or passive ROM today due to pain.  She may benefit from sling to R UE for pain control/comfort with mobilization. She will benefit from continued OT services while admitted and after dc at [facility] level to optimize independence, functional use of R UE for ADL and IADL performance.  Will follow.      Follow Up Recommendations  Home health OT;Supervision/Assistance - 24 hour(pending progress maybe OP if moving better?)    Equipment Recommendations  3 in 1 bedside commode    Recommendations for Other Services       Precautions / Restrictions Precautions Precautions: Other (comment) Precaution Comments: R UE pain  Restrictions Weight Bearing Restrictions: No      Mobility Bed Mobility Overal bed mobility: Needs Assistance Bed Mobility: Supine to Sit;Sit to Supine      Supine to sit: Min assist Sit to supine: Min guard   General bed mobility comments: min assist for trunk support to ascend, min guard for safety to return supine   Transfers Overall transfer level: Needs assistance Equipment used: 1 person hand held assist Transfers: Sit to/from [facility] Sit to Stand: Min assist Stand pivot transfers: Min guard       General transfer comment: min assist to power up and steady, pivot to [facility] with min guard     Balance Overall balance assessment: Mild deficits observed, not formally tested                                         ADL either performed or assessed with clinical judgement   ADL Overall ADL's : Needs assistance/impaired Eating/Feeding: Minimal assistance   Grooming: Minimal assistance;Bed level   Upper Body Bathing: Moderate assistance;Bed level   Lower Body Bathing: Maximal assistance;Bed level   Upper Body Dressing : Maximal assistance;Bed level   Lower Body Dressing: Maximal assistance;Bed level   Toilet Transfer: Minimal assistance;BSC;Stand-pivot   Toileting- Clothing Manipulation and Hygiene: Min guard;Sit to/from stand       Functional mobility during ADLs: Minimal assistance General ADL Comments: pt limited by decreased activity tolerance, R UE pain and impaired functional use      Vision         Perception     Praxis      Pertinent Vitals/Pain Pain Assessment: 0-10 Pain Score: 6  Pain Location: R UE-shoulder Pain Descriptors / Indicators: Discomfort;Spasm;Stabbing Pain Intervention(s): Limited activity within patient's tolerance;Monitored during session     Hand Dominance Right   Extremity/Trunk Assessment Upper Extremity Assessment Upper Extremity Assessment: Generalized weakness;RUE deficits/detail RUE Deficits / Details: limited by pain and edema; no A/PROM at shoulder (pain), elbow and distal 3/5; limited [place]  RUE: Unable to fully assess due to pain RUE  Sensation: decreased light touch RUE Coordination: decreased fine motor;decreased gross motor   Lower Extremity Assessment Lower Extremity Assessment: Defer to PT evaluation       Communication Communication Communication: No difficulties   Cognition Arousal/Alertness: Awake/alert Behavior During Therapy: WFL for tasks assessed/performed Overall Cognitive Status: Within Functional Limits for tasks assessed     appears WFL, not formally tested; noted slow processing and decreased initation when ordering breakfast. Continue to assess.  appears overwhelmed from situation.                                  General Comments  R axillary area edematous and painful limiting functional use; educated pt on use of UE functionally for eating and grooming while supported proximally    Exercises     Shoulder Instructions      Home Living Family/patient expects to be discharged to:: Private residence Living Arrangements: Children Available Help at Discharge: Family Type of Home: House Home Access: Stairs to enter Technical brewer of Steps: 4-5 Entrance Stairs-Rails: Can reach both;Right;Left Home Layout: One level     Bathroom Shower/Tub: Teacher, early years/pre: Standard     Home Equipment: Building services engineer Comments: daughter is 56 and in school, mom lives nearby and has been assisting       Prior Functioning/Environment Level of Independence: Independent        Comments: works at tax department; 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        OT Problem List: Decreased strength;Decreased range of motion;Decreased activity tolerance;Decreased coordination;Decreased knowledge of use of DME or AE;Decreased knowledge of precautions;Impaired UE functional use;Pain;Increased edema      OT Treatment/Interventions: Self-care/ADL training;Therapeutic exercise;DME and/or AE instruction;Therapeutic activities;Patient/family  education;Balance training    OT Goals(Current goals can be found in the care plan section) Acute Rehab OT Goals Patient Stated Goal: less pain, feel better OT Goal Formulation: With patient Time For Goal Achievement: 05/28/19 Potential to Achieve Goals: Good  OT Frequency: Min 3X/week   Barriers to D/C:            Co-evaluation              AM-PAC OT "6 Clicks" Daily Activity     Outcome Measure Help from another person eating meals?: A Little Help from another person taking care of personal grooming?: A Little Help from another person toileting, which includes using toliet, bedpan, or urinal?: A Little Help from another person bathing (including washing, rinsing, drying)?: A Lot Help from another person to put on and taking off regular upper body clothing?: A Lot Help from another person to put on and taking off regular lower body clothing?: A Lot 6 Click Score: 15   End of Session Nurse Communication: Mobility status  Activity Tolerance: Patient limited by pain Patient left: in bed;with call bell/phone within reach;with family/visitor present  OT Visit Diagnosis: Other abnormalities of gait and mobility (R26.89);Muscle weakness (generalized) (M62.81);Pain Pain - Right/Left: Right Pain - part of body:  Shoulder;Arm                Time: [id number] OT Time Calculation (min): 31 min Charges:  OT General Charges $OT Visit: 1 Visit OT Evaluation $OT Eval Moderate Complexity: 1 Mod OT Treatments $Self Care/Home Management : 8-22 mins  Jolaine Artist, OT Acute Rehabilitation Services Pager [phone] Office [phone]   Delight Stare 05/14/2019, 11:02 AM

## 2019-05-14 NOTE — Progress Notes (Signed)
Orthopedic Tech Progress Note Patient Details:  Angelica Dunn [birth year] [id number]  Ortho Devices Type of Ortho Device: Sling immobilizer Ortho Device/Splint Location: right Ortho Device/Splint Interventions: Application   Post Interventions Patient Tolerated: Well Instructions Provided: Care of device   [place] Pink 05/14/2019, 2:15 PM

## 2019-05-14 NOTE — TOC Initial Note (Addendum)
Transition of Care [facility]) - Initial/Assessment Note    Patient Details  Name: Angelica Dunn MRN: [id number] Date of Birth: 10/02/1970  Transition of [facility]) CM/SW Contact:    Marilu Favre, RN Phone Number: 05/14/2019, 2:40 PM  Clinical Narrative:                  Patient from home with daughter. Mother assists.    Ordered 3 in 1 from [place] with [place].   Called [facility] to arrange home health , they no longer have a contract with [place]. Called [employer] [phone] spoke with Al supervisor, was told I needed to call patient's case manager at [facility] who is Dylanna D. At [phone] ext [phone] called same Ned Card is out of office until May 18, 2019, directed to call Colletta Maryland at ext [phone] left voicemail mail.   Colletta Maryland returned call , per Harrington Challenger , and [facility] and [facility] are in network with [place].   Called Santiago Glad with [place] they are out of network .  Called [place] with [facility] they are out of network.     Called Drew with [facility] they are out of network.   Malachy Mood with [facility] is out of network.  Hoyle Sauer with [facility] is out of network    [facility] unable to accept.  [facility] unable to accept.   [facility] unable to accept. [facility] unable to accept.  Cory with [employer] [facility] at [place] back. She is aware of above. Patient also aware and provided with Dylanna's and jackie's numbers.   Home health of [facility] unable to accept referral.  Tommi Rumps at [place] can provide HHPT/OT but not Therapist, sports and aide. If needed possible to add later. Messaged MD , will need orders changed if agreeable.  Kennyth Lose at [facility] aware   Expected Discharge Plan: [place] Barriers to Discharge: No Barriers Identified   Patient Goals and CMS Choice Patient states their goals for this hospitalization and ongoing recovery are:: to return to home CMS Medicare.gov Compare Post  Acute Care list provided to:: Patient Choice offered to / list presented to : Patient  Expected Discharge Plan and Services Expected Discharge Plan: [place]   Discharge Planning Services: CM Consult Post Acute Care Choice: [place] arrangements for the past 2 months: Single Family Home                 DME Arranged: 3-N-1 DME Agency: [facility] Date DME Agency Contacted: 05/14/19 Time DME Agency Contacted: [phone] Representative spoke with at DME Agency: [place] Arranged: PT, OT, RN, Nurse's Aide          Prior Living Arrangements/Services Living arrangements for the past 2 months: [place] with:: Adult Children Patient language and need for interpreter reviewed:: Yes Do you feel safe going back to the place where you live?: Yes      Need for Family Participation in Patient Care: Yes (Comment) Care giver support system in place?: Yes (comment)   Criminal Activity/Legal Involvement Pertinent to Current Situation/Hospitalization: No - Comment as needed  Activities of Daily Living Home Assistive Devices/Equipment: None ADL Screening (condition at time of admission) Patient's cognitive ability adequate to safely complete daily activities?: Yes Is the patient deaf or have difficulty hearing?: No Does the patient have difficulty seeing, even when wearing glasses/contacts?: No Does the patient have difficulty concentrating, remembering,  or making decisions?: No Patient able to express need for assistance with ADLs?: No Does the patient have difficulty dressing or bathing?: No Independently performs ADLs?: No Does the patient have difficulty walking or climbing stairs?: No Weakness of Legs: None Weakness of Arms/Hands: None  Permission Sought/Granted   Permission granted to share information with : No  Share Information with NAME: mother           Emotional Assessment Appearance:: Appears stated age Attitude/Demeanor/Rapport:  Engaged Affect (typically observed): Accepting Orientation: : Oriented to Self, Oriented to Place, Oriented to  Time, Oriented to Situation Alcohol / Substance Use: Not Applicable Psych Involvement: No (comment)  Admission diagnosis:  Leukocytosis [D72.829] Common bile duct dilatation [K83.8] Abdominal pain, RUQ [R10.11] Patient Active Problem List   Diagnosis Date Noted  . DVT of right axillary vein, acute ([place])   . Hypokalemia   . Hypophosphatemia   . Shortness of breath   . Transaminitis   . Hypercalcemia   . Constipation   . Normocytic anemia   . Melanoma of skin ([place])   . Leukocytosis 05/06/2019   PCP:  Renaldo Reel, PA Pharmacy:   [place], [place] [address] [place] [address] Phone: [phone] Fax: [place], [place] - [address] [place] [address] Phone: [phone] Fax: [phone]  [facility] [place], [place] - [address] [place] [address] Phone: [phone] Fax: [phone]     Social Determinants of Health (SDOH) Interventions    Readmission Risk Interventions No flowsheet data found.

## 2019-05-14 NOTE — Care Management (Signed)
Per [address]W/[employer] @ [place] are:   [email or web address] ,[place].

## 2019-05-14 NOTE — Progress Notes (Signed)
Discharge instructions given. Patient verbalized understanding and all questions were answered.  ?

## 2019-05-14 NOTE — Evaluation (Signed)
Physical Therapy Evaluation Patient Details Name: Angelica Dunn MRN: [id number] DOB: [birth year] Today's Date: 05/14/2019   History of Present Illness  Pt is a 49 y/o female with hx of recurrent melanoma with lymph node involvement. S/P R axillary lymph node dissection and placement of port for possible immunotherapy 0000000, post op complicated by elevated liver enzymes. Found with acute R UE DVT. Negative DVT BLE imaging. MRI head negative for metastatic disease.     Clinical Impression  Pt presented supine in bed with HOB elevated, awake and willing to participate in therapy session. Pt's mother present throughout session as well. Prior to admission, pt reported that she ambulated independently and could perform ADLs independently but very slowly and she was exhausted afterwards. Pt lives with her teenage daughter and has her mother to assist when her daughter is at school. At the time of evaluation, pt significantly limited with functional mobility secondary to R UE pain, weakness and fatigue. Pt would continue to benefit from skilled physical therapy services at this time while admitted and after d/c to address the below listed limitations in order to improve overall safety and independence with functional mobility.       Follow Up Recommendations Home health PT;Supervision/Assistance - 24 hour    Equipment Recommendations  3in1 (PT);Other (comment)(R UE sling for comfort with mobility)    Recommendations for Other Services       Precautions / Restrictions Precautions Precautions: Fall Precaution Comments: R UE pain  Restrictions Weight Bearing Restrictions: No      Mobility  Bed Mobility Overal bed mobility: Needs Assistance Bed Mobility: Supine to Sit;Sit to Supine     Supine to sit: Min guard Sit to supine: Min guard   General bed mobility comments: no physical assistance needed, min guard for safety, HOB elevated  Transfers Overall transfer level: Needs  assistance Equipment used: 1 person hand held assist Transfers: Sit to/from Stand Sit to Stand: Min assist Stand pivot transfers: Min guard       General transfer comment: min A for stability with transition into standing from EOB; pt unable to tolerate standing for more than two seconds secondary to R UE pain  Ambulation/Gait             General Gait Details: deferred secondary to pain  Stairs            Wheelchair Mobility    Modified Rankin (Stroke Patients Only)       Balance Overall balance assessment: Needs assistance Sitting-balance support: Feet supported Sitting balance-Leahy Scale: Good     Standing balance support: During functional activity Standing balance-Leahy Scale: Fair                               Pertinent Vitals/Pain Pain Assessment: 0-10 Pain Score: 6  Pain Location: R UE-shoulder Pain Descriptors / Indicators: Discomfort;Spasm;Stabbing Pain Intervention(s): Monitored during session;Repositioned    Home Living Family/patient expects to be discharged to:: Private residence Living Arrangements: Children Available Help at Discharge: Family Type of Home: House Home Access: Stairs to enter Entrance Stairs-Rails: Can reach both;Right;Left Entrance Stairs-Number of Steps: 4-5 Home Layout: One level Home Equipment: Clinical cytogeneticist - 4 wheels Additional Comments: daughter is 66 and in school, mom lives nearby and has been assisting     Prior Function Level of Independence: Independent         Comments: works at tax department; 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     Hand Dominance   Dominant Hand: Right    Extremity/Trunk Assessment   Upper Extremity Assessment Upper Extremity Assessment: Defer to OT evaluation RUE Deficits / Details: limited by pain and edema; no A/PROM at shoulder (pain), elbow and distal 3/5; limited [place]  RUE: Unable to fully assess due to pain RUE Sensation: decreased  light touch RUE Coordination: decreased fine motor;decreased gross motor    Lower Extremity Assessment Lower Extremity Assessment: Generalized weakness       Communication   Communication: No difficulties  Cognition Arousal/Alertness: Awake/alert Behavior During Therapy: Flat affect Overall Cognitive Status: Within Functional Limits for tasks assessed                                 General Comments: cognition not formally assessed but [facility] for general conversation, although pt delayed with responses and her mother mostly answered all of PT's questions      General Comments General comments (skin integrity, edema, etc.): R axillary area edematous and painful limiting functional use; educated pt on use of UE functionally for eating and grooming while supported proximally    Exercises     Assessment/Plan    PT Assessment Patient needs continued PT services  PT Problem List Decreased strength;Decreased range of motion;Decreased activity tolerance;Decreased balance;Decreased mobility;Decreased coordination;Decreased knowledge of use of DME;Decreased safety awareness;Decreased knowledge of precautions;Pain       PT Treatment Interventions DME instruction;Gait training;Stair training;Functional mobility training;Therapeutic activities;Therapeutic exercise;Neuromuscular re-education;Balance training;Patient/family education    PT Goals (Current goals can be found in the Care Plan section)  Acute Rehab PT Goals Patient Stated Goal: less pain, feel better PT Goal Formulation: With patient/family Time For Goal Achievement: 05/28/19 Potential to Achieve Goals: Good    Frequency Min 3X/week   Barriers to discharge        Co-evaluation               AM-PAC PT "6 Clicks" Mobility  Outcome Measure Help needed turning from your back to your side while in a flat bed without using bedrails?: None Help needed moving from lying on your back to sitting on the side of  a flat bed without using bedrails?: None Help needed moving to and from a bed to a chair (including a wheelchair)?: A Little Help needed standing up from a chair using your arms (e.g., wheelchair or bedside chair)?: A Little Help needed to walk in hospital room?: A Little Help needed climbing 3-5 steps with a railing? : A Lot 6 Click Score: 19    End of Session   Activity Tolerance: Patient limited by pain Patient left: in bed;with call bell/phone within reach;with family/visitor present Nurse Communication: Mobility status PT Visit Diagnosis: Other abnormalities of gait and mobility (R26.89);Pain Pain - Right/Left: Right Pain - part of body: Arm;Shoulder    Time: [id number] PT Time Calculation (min) (ACUTE ONLY): 17 min   Charges:   PT Evaluation $PT Eval Moderate Complexity: 1 Mod          Eduard Clos, PT, DPT  Acute Rehabilitation Services Pager [phone] Office [place] 05/14/2019, 12:45 PM

## 2019-05-14 NOTE — Discharge Summary (Signed)
Physician Discharge Summary  Angelica Dunn [id number] DOB: 1971-02-23 DOA: 05/06/2019  PCP: Renaldo Reel, PA  Admit date: 05/06/2019 Discharge date: 05/14/2019  Time spent: 60 minutes  Recommendations for Outpatient Follow-up:  1. Follow-up with Dr. Bobby Rumpf, oncology in [facility] in 3 to 4 days.  On follow-up patient will need a comprehensive metabolic profile done to follow-up on electrolytes, renal function, LFTs as well as a CBC with differential to follow-up on counts. 2. Follow-up with Renaldo Reel, PA in 2 weeks.  On follow-up patient's blood pressure need to be reassessed as patient blood pressure remained stable off antihypertensive medications during the hospitalization.  Patient also need a comprehensive metabolic profile done to follow-up on electrolytes, renal function, LFTs.  Patient also need a magnesium and phosphorus level checked.  Patient also need a CBC done.  Patient will need referral to GI for follow-up on transaminitis.   Discharge Diagnoses:  Active Problems:   Leukocytosis   Melanoma of skin (HCC)   DVT of right axillary vein, acute (HCC)   Hypokalemia   Hypophosphatemia   Shortness of breath   Transaminitis   Hypercalcemia   Constipation   Normocytic anemia   Discharge Condition: Stable  Diet recommendation: Regular  Filed Weights   05/07/19 0107 05/13/19 0500 05/14/19 0500  Weight: 57.1 kg 65.4 kg 66.2 kg    History of present illness:  HPI per Dr. Marthenia Dunn Patient is a 49 year old lady with history of recurrent melanoma with lymph node involvement.  Patient underwent right axillary lymph node dissection on March 31, 2019, as well as, placement of port for possible immunotherapy.  Postop.  Has been complicated by significantly elevated liver enzymes.  Patient was referred to the GI team by the PCP, patient was seen earlier today.  Liver enzymes remain significantly elevated.  Additionally, patient has significant  leukocytosis, with WBC of 39,000.  Patient denied fever, chills, headache, neck pain, URI symptoms, chest pain, shortness of breath, GI symptoms or urinary symptoms.  Abdominal ultrasound revealed biliary dilatation with common bile duct measuring 10 mm.  Gallbladder was said to be mildly distended, but otherwise normal.  No gallstones.  Right-sided pleural effusion was incidentally noted, which was said to be new.  Patient was admitted for further assessment and management.  ED Course: On presentation to the hospital, vitals revealed temperature of 98.5, heart rate of 140 140 bpm, respiratory rate of 15, blood pressure of 110/74 mmHg and O2 sat of 99%.  Patient was pancultured by the ER providers.  Patient started IV vancomycin and Zosyn.  Pertinent labs: CBC reveals WBC of 39, hemoglobin of 14.7, hematocrit of 44.3 with platelet count of 500.  Test revealed ALT of 741, AST of 486, alkaline phosphatase of 225, albumin of 3.2, direct bilirubin of 1.5 with total bilirubin of 2 and total protein of 7.2.  Imaging: independently reviewed.   Review of Systems:  Negative for fever, visual changes, sore throat, rash, new muscle aches, chest pain, SOB, dysuria, bleeding, n/v/abdominal pain.  Patient has right axillary discomfort/pain following lymph node dissection on March 30, 2019.   Hospital Course:  1 leukocytosis with SIRS-like picture, POA in the setting of malignancy Patient on admission noted to have a significant leukocytosis noted to be tachycardic with no source of infection noted.  Likely secondary to malignancy.   White count was close to 40 on admission initially trending down but started to trend back up at 44.2 by day of discharge..  Patient pancultured  with no growth to date.  MRI abdomen done negative for choledocholithiasis or biliary obstruction, no evidence of hepatic abscess or mass, small right pleural effusion right chest and abdominal wall edema, probable postop seroma in the  right breast.  Patient remained afebrile during the hospitalization.  Patient received empiric IV cefepime and completed 7-day course of treatment.  Patient was also followed by oncology who are in agreement that patient's leukocytosis was likely secondary to her malignancy.  Patient was discharged home in stable condition will follow up with her primary oncologist in the outpatient setting in the next 3 to 4 days for initiation of therapy.  Outpatient follow-up.   2.  Metastatic lymph node involvement associated malaise and fatigue Status post lymph node dissection.  Patient noted to have 7/19 lymph nodes positive for metastatic melanoma and breast lesion was complex sclerosing lesion.  Port in place.  Patient not started immunotherapy due to abnormalities.  Immunotherapy currently on hold due to abnormal LFTs and will be started in the outpatient setting.  GI team following on LFTs and recommended to monitor for now with no ERCP recommended at this time.  Patient will follow up with oncologist in [facility].  Patient was seen by medical oncology during this hospitalization and feel no inpatient treatment recommended at this time.  MRI brain negative for metastatic disease.  CT chest done negative for PE however did show extensive aggressive and progressive infiltrating tumor in the right axilla and invading the right chest wall and involving the right chest wall musculature.  New small right pleural effusion with overlying atelectasis.  No definite metastatic pulmonary nodules noted.  Dr. Burr Medico with oncology spoke with Dr. Bobby Rumpf, patient's primary oncologist at [facility] cancer center who plans to see patient early back next week to start her on immunotherapy and radiation at the cancer center.   Patient will be discharged home in stable condition and will follow up with her primary oncologist, Dr. Bobby Rumpf in 3 to 4 days for further evaluation and management and initiation of immunotherapy.  3.   Right axillary pain secondary metastatic melanoma and to acute DVT Patient with complaints of right axillary pain with right upper extremity swelling.  Doppler ultrasound done positive for DVT.  Due to arm swelling and tightness vascular surgery consulted to rule out compartment syndrome.  It was felt patient did not have compartment syndrome at this time and if patient was to develop forearm numbness and tingling to reconsult for potential hand surgery fasciotomies.  Patient was initially placed on oxycodone however stated oxycodone not managing her pain significantly well.  Oxycodone was subsequently discontinued and patient started on MSIR 15 mg every 4 hours as needed for breakthrough pain.  Patient's pain was better managed on MSIR 7.5-15 mg every 4 hours as needed.  Patient be discharged home on 10 pills of MSIR and will follow up closely with hematology/oncology who will continue to manage patient's pain.  Patient also was on IV heparin and transition to oral Eliquis which she will be discharged home on.   4.  Acute right upper extremity DVT in the setting of malignancy and right upper extremity lymphedema Per Dopplers of the right upper extremity showing acute DVT involving right brachial veins, right radial veins, right ulnar veins unable to visualize cephalic and basilic veins.  Patient initially was placed on IV heparin.  Dr. Burr Medico, oncology recommended lifelong anticoagulation with Lovenox on discharge and to continue heparin while inpatient.  Patient however uncomfortable to  do self injections and as such Dr. Burr Medico recommends that most likely convenient therapy would be Eliquis and recommend starting at 10 mg twice daily for 7 days, then 5 mg twice daily thereafter.  Patient subsequently transition from IV heparin to Eliquis prior to discharge which she tolerated.  Patient be discharged home on Eliquis.  Outpatient follow-up with hematology/oncology.  5.  Shortness of breath Patient with some  complaints of shortness of breath per mother.  Likely secondary to recurrent melanoma with metastatic lymph node involvement.  CT chest which was done was negative for PE however did show extensive aggressive and progressive infiltrating tumor in the right axilla.  Left upper extremity positive for DVT.  Patient was initially placed on IV heparin. Patient also noted to be on IV fluids early on in the hospitalization for hypercalcemia.  Repeat chest x-ray with increased right basilar atelectasis or infiltrate noted with possible small pleural effusion.  Status post Lasix 40 mg IV x1 on 05/12/2019 with a urine output not properly recorded.  Patient improved clinically was speaking in full sentences will be discharged in stable and improved condition.  Outpatient follow-up.   6.  Hypercalcemia Likely secondary to malignancy of melanoma.  Patient initially on IV fluids and changed to sodium bicarb drip.  Status post IV Zometa x1.  Calcium went from 13 and stabilized at 8.9 by day of discharge.  Corrected calcium by day of discharge was 10.82 by day of discharge.  Outpatient follow-up with oncology.   7.  Thrombocytosis Resolved.  Outpatient follow-up.   8.  Transaminitis/questionable autoimmune hepatitis/hyperbilirubinemia Patient noted to have transaminitis which was initially improving and subsequently fluctuated and started to trend back down. MRCP with no evidence of biliary obstruction or choledocholithiasis.  No evidence of hepatic abscess or mass.  Mono screen was negative.  Alpha-1 antitrypsin serum level greater than 300.  Ceruloplasmin 44.  GI has assessed patient and recommends no plans for ERCP and continue empiric IV antibiotics.  GI feels likely drug-induced liver injury.  Concern for autoimmune etiology.  ANA positive and ANA pending was cytoplasmic fluorescence.  HCV quantitative less than 1.18.  HCV RNA PCR less than 15.  Will need outpatient follow-up per Dr. Teena Irani discussion with GI, Dr.  Ardis Hughs.  9.  Hypokalemia/hypophosphatemia Repleted.  10.  Normocytic anemia Patient with no overt bleeding.    H&H remained stable.  Outpatient follow-up.    11.  Constipation Patient on presentation had some complaints of constipation.  Patient was placed on MiraLAX twice daily as well as Senokot-S twice daily with good results.  MiraLAX was subsequently changed to as needed and patient maintained on Senokot-S twice daily as patient was on narcotic pain medication for pain management.  Outpatient follow-up.   12.  Poor oral intake/low albumin Nutrition consulted.  Patient was maintained on nutritional supplementation.   14.  Sinus tachycardia Secondary to pain secondary to metastatic recurrent melanoma.  Patient was placed on MSIR for breakthrough pain.  Outpatient follow-up.   Procedures:  CT angiogram chest 05/07/2019  MRI brain 05/11/2019  MRI abdomen 05/07/2019  Abdominal ultrasound 05/06/2019  Lower extremity Dopplers 05/10/2019  Upper extremity Doppler 05/09/2019   Consultations:  Vascular surgery: Dr. Oneida Alar 05/10/2019  Oncology: Dr. Burr Medico 05/07/2019  General surgery: Dr. Dema Severin 05/07/2019    Discharge Exam: Vitals:   05/14/19 0532 05/14/19 1454  BP: 119/69 118/72  Pulse: (!) 123 (!) 122  Resp: 20 (!) 22  Temp: 98 F (36.7 C) 98 F (36.7 C)  SpO2: 100% 100%    General: NAD Cardiovascular: Tachycardia.  No murmurs rubs or gallops. Respiratory: Clear to auscultation bilaterally.  Discharge Instructions   Discharge Instructions    Diet general   Complete by: As directed    Increase activity slowly   Complete by: As directed      Allergies as of 05/14/2019      Reactions   Gabapentin Other (See Comments)   "Made me feel odd, so I stopped taking it"      Medication List    STOP taking these medications   lisinopril 20 MG tablet Commonly known as: ZESTRIL   oxyCODONE 5 MG immediate release tablet Commonly known as: Oxy IR/ROXICODONE      TAKE these medications   acetaminophen 325 MG tablet Commonly known as: TYLENOL Take 325-650 mg by mouth every 6 (six) hours as needed for mild pain or headache.   amitriptyline 10 MG tablet Commonly known as: ELAVIL Take 10-20 mg by mouth at bedtime as needed for sleep.   apixaban 5 MG Tabs tablet Commonly known as: ELIQUIS Take 2 tablets (10 mg total) by mouth 2 (two) times daily for 7 days.   apixaban 5 MG Tabs tablet Commonly known as: ELIQUIS Take 1 tablet (5 mg total) by mouth 2 (two) times daily. Start taking on: May 20, 2019   ezetimibe 10 MG tablet Commonly known as: ZETIA Take 1 tablet (10 mg total) by mouth daily. Start taking on: May 21, 2019 What changed: These instructions start on May 21, 2019. If you are unsure what to do until then, ask your doctor or other care provider.   feeding supplement (ENSURE ENLIVE) Liqd Take 237 mLs by mouth 2 (two) times daily between meals. Start taking on: May 15, 2019   fexofenadine 180 MG tablet Commonly known as: ALLEGRA Take 180 mg by mouth daily as needed for rhinitis (or seasonal allergies).   magnesium hydroxide 400 MG/5ML suspension Commonly known as: MILK OF MAGNESIA Take 15-30 mLs by mouth 2 (two) times daily as needed for mild constipation or moderate constipation.   morphine 15 MG tablet Commonly known as: MSIR Take 0.5-1 tablets (7.5-15 mg total) by mouth every 4 (four) hours as needed for severe pain.   multivitamin with minerals Tabs tablet Take 1 tablet by mouth daily. Start taking on: May 15, 2019   Nasacort Allergy 24HR 21 MCG/ACT Aero nasal inhaler Generic drug: triamcinolone Place 2 sprays into the nose daily as needed (for seasonal allergies).   ondansetron 4 MG disintegrating tablet Commonly known as: ZOFRAN-ODT Take 4 mg by mouth every 8 (eight) hours as needed for nausea or vomiting (DISSOLVE ORALLY).   polyethylene glycol 17 g packet Commonly known as: MIRALAX / GLYCOLAX Take 17 g  by mouth daily as needed for mild constipation.   senna-docusate 8.6-50 MG tablet Commonly known as: Senokot-S Take 1 tablet by mouth 2 (two) times daily.   traMADol 50 MG tablet Commonly known as: ULTRAM Take 50 mg by mouth every 6 (six) hours as needed (for pain).            Durable Medical Equipment  (From admission, onward)         Start     Ordered   05/14/19 1257  For home use only DME 3 n 1  Once     05/14/19 1256   05/13/19 1646  For home use only DME 3 n 1  Once     05/13/19 1645  Allergies  Allergen Reactions  . Gabapentin Other (See Comments)    "Made me feel odd, so I stopped taking it"   Follow-up Information    Renaldo Reel, PA. Schedule an appointment as soon as possible for a visit in 2 week(s).   Specialty: Family Medicine Contact information: [address] [place] [address] [phone]        Marice Potter, MD Follow up in 3 day(s).   Specialty: Oncology Why: Follow-up in 3 to 4 days.  Office will call with appointment time. Contact information: [address] [place] [address] [phone]            The results of significant diagnostics from this hospitalization (including imaging, microbiology, ancillary and laboratory) are listed below for reference.    Significant Diagnostic Studies: CT Angio Chest PE W and/or Wo Contrast  Result Date: 05/07/2019 CLINICAL DATA:  History of melanoma. Axillary biopsy 5 weeks ago. Pain, swelling and inability to raise the right arm. No chest pain or shortness of breath. EXAM: CT ANGIOGRAPHY CHEST WITH CONTRAST TECHNIQUE: Multidetector CT imaging of the chest was performed using the standard protocol during bolus administration of intravenous contrast. Multiplanar CT image reconstructions and MIPs were obtained to evaluate the vascular anatomy. CONTRAST:  60 cc OMNIPAQUE IOHEXOL 350 MG/ML SOLN COMPARISON:  Chest CT 04/28/2019 FINDINGS: Cardiovascular: The heart is  normal in size. No pericardial effusion. The aorta is normal in caliber. No dissection. The branch vessels are patent. No coronary artery calcifications. The pulmonary arterial tree is well opacified. No filling defects to suggest pulmonary embolism. Mediastinum/Nodes: Small scattered mediastinal and hilar lymph nodes are stable. No mass or overt adenopathy. The esophagus is grossly normal. The thyroid gland is normal. Lungs/Pleura: New small right pleural effusion with overlying atelectasis. No focal infiltrates or worrisome pulmonary nodules. Upper Abdomen: No significant upper abdominal findings. Chest wall/musculoskeletal: Extensive and progressive infiltrating tumor in the right axilla and invading the right chest wall. This measures a maximum of 10.2 x 6.2 cm on image 47/6. This previously measured 8.1 x 3.4 cm. Tumor is now bulging the pleura. Do not see any definite direct bony invasion or destruction. Suspect direct invasion of the latissimus dorsi, serratus anterior and subscapularis muscles. Review of the MIP images confirms the above findings. IMPRESSION: 1. No CT findings for pulmonary embolism. 2. Extensive, aggressive and progressive infiltrating tumor in the right axilla. This is invading the right chest wall and involving the right chest wall musculature as above. 3. No mediastinal or hilar mass or adenopathy. 4. New small right pleural effusion with overlying atelectasis. No definite metastatic pulmonary nodules. Electronically Signed   By: Marijo Sanes M.D.   On: 05/07/2019 07:55   MR BRAIN W WO CONTRAST  Result Date: 05/11/2019 CLINICAL DATA:  Melanoma, staging EXAM: MRI HEAD WITHOUT AND WITH CONTRAST TECHNIQUE: Multiplanar, multiecho pulse sequences of the brain and surrounding structures were obtained without and with intravenous contrast. CONTRAST:  41mL GADAVIST GADOBUTROL 1 MMOL/ML IV SOLN COMPARISON:  None. FINDINGS: Brain: There is no acute infarction or intracranial hemorrhage. There  is no intracranial mass, mass effect, or edema. There is no hydrocephalus or extra-axial fluid collection. No abnormal enhancement. Vascular: Major vessel flow voids at the skull base are preserved. Skull and upper cervical spine: No focal suspicious osseous lesion. Sinuses/Orbits: Paranasal sinuses are aerated. Orbits are unremarkable. Other: Sella is unremarkable. Minor left mastoid fluid opacification. IMPRESSION: No evidence of intracranial metastatic disease. Electronically Signed  By: Macy Mis M.D.   On: 05/11/2019 12:13   DG CHEST PORT 1 VIEW  Result Date: 05/13/2019 CLINICAL DATA:  Shortness of breath. EXAM: PORTABLE CHEST 1 VIEW COMPARISON:  March 31, 2019. FINDINGS: The heart size and mediastinal contours are within normal limits. No pneumothorax. Left lung is clear. Left subclavian Port-A-Cath is noted. Increased right basilar atelectasis or infiltrate is noted with possible small pleural effusion. The visualized skeletal structures are unremarkable. IMPRESSION: Increased right basilar atelectasis or infiltrate is noted with possible small pleural effusion. Electronically Signed   By: Marijo Conception M.D.   On: 05/13/2019 08:20   MR ABDOMEN MRCP W WO CONTAST  Result Date: 05/07/2019 CLINICAL DATA:  Acute cholangitis. Mild biliary ductal dilatation. Leukocytosis. Melanoma and right breast carcinoma. EXAM: MRI ABDOMEN WITHOUT AND WITH CONTRAST (INCLUDING MRCP) TECHNIQUE: Multiplanar multisequence MR imaging of the abdomen was performed both before and after the administration of intravenous contrast. Heavily T2-weighted images of the biliary and pancreatic ducts were obtained, and three-dimensional MRCP images were rendered by post processing. CONTRAST:  5.72mL GADAVIST GADOBUTROL 1 MMOL/ML IV SOLN COMPARISON:  Ultrasound on 05/06/2019 and CT on 04/28/2019 FINDINGS: Lower chest: Small right pleural effusion. Subcutaneous edema in right chest and abdominal wall. Small fluid collection in  right breast, likely representing a postop seroma. Hepatobiliary: No evidence of hepatic mass or abscess. Unremarkable appearance of gallbladder. Common bile duct measures 8 mm in diameter. No evidence of common bile duct wall thickening or enhancement. No evidence of choledocholithiasis or biliary stricture. Pancreas: No mass or inflammatory changes. No evidence of pancreatic ductal dilatation or pancreas divisum. Spleen:  Within normal limits in size and appearance. Adrenals/Urinary Tract: No masses identified. No evidence of hydronephrosis. Stomach/Bowel: Visualized portion unremarkable. Vascular/Lymphatic: No pathologically enlarged lymph nodes identified. No abdominal aortic aneurysm. Other:  None. Musculoskeletal:  No suspicious bone lesions identified. IMPRESSION: 1. No evidence of biliary obstruction or choledocholithiasis. 2. No evidence of hepatic abscess or mass. 3. Small right pleural effusion, and right chest and abdominal wall edema. Probable postop seroma in the right breast. Electronically Signed   By: Marlaine Hind M.D.   On: 05/07/2019 08:05   VAS [place] LOWER EXTREMITY VENOUS (DVT)  Result Date: 05/10/2019  Lower Venous DVTStudy Indications: Swelling.  Risk Factors: None identified. Comparison Study: No prior studies. Performing Technologist: Oliver Hum RVT  Examination Guidelines: A complete evaluation includes B-mode imaging, spectral Doppler, color Doppler, and power Doppler as needed of all accessible portions of each vessel. Bilateral testing is considered an integral part of a complete examination. Limited examinations for reoccurring indications may be performed as noted. The reflux portion of the exam is performed with the patient in reverse Trendelenburg.  +---------+---------------+---------+-----------+----------+--------------+ RIGHT    CompressibilityPhasicitySpontaneityPropertiesThrombus Aging +---------+---------------+---------+-----------+----------+--------------+ CFV       Full           Yes      Yes                                 +---------+---------------+---------+-----------+----------+--------------+ SFJ      Full                                                        +---------+---------------+---------+-----------+----------+--------------+ FV Prox  Full                                                        +---------+---------------+---------+-----------+----------+--------------+  FV Mid   Full                                                        +---------+---------------+---------+-----------+----------+--------------+ FV DistalFull                                                        +---------+---------------+---------+-----------+----------+--------------+ PFV      Full                                                        +---------+---------------+---------+-----------+----------+--------------+ POP      Full           Yes      Yes                                 +---------+---------------+---------+-----------+----------+--------------+ PTV      Full                                                        +---------+---------------+---------+-----------+----------+--------------+ PERO     Full                                                        +---------+---------------+---------+-----------+----------+--------------+   +---------+---------------+---------+-----------+----------+--------------+ LEFT     CompressibilityPhasicitySpontaneityPropertiesThrombus Aging +---------+---------------+---------+-----------+----------+--------------+ CFV      Full           Yes      Yes                                 +---------+---------------+---------+-----------+----------+--------------+ SFJ      Full                                                        +---------+---------------+---------+-----------+----------+--------------+ FV Prox  Full                                                         +---------+---------------+---------+-----------+----------+--------------+ FV Mid   Full                                                        +---------+---------------+---------+-----------+----------+--------------+  FV DistalFull                                                        +---------+---------------+---------+-----------+----------+--------------+ PFV      Full                                                        +---------+---------------+---------+-----------+----------+--------------+ POP      Full           Yes      Yes                                 +---------+---------------+---------+-----------+----------+--------------+ PTV      Full                                                        +---------+---------------+---------+-----------+----------+--------------+ PERO     Full                                                        +---------+---------------+---------+-----------+----------+--------------+     Summary: RIGHT: - There is no evidence of deep vein thrombosis in the lower extremity.  - No cystic structure found in the popliteal fossa.  LEFT: - There is no evidence of deep vein thrombosis in the lower extremity.  - No cystic structure found in the popliteal fossa.  *See table(s) above for measurements and observations. Electronically signed by Ruta Hinds MD on 05/10/2019 at 7:07:53 PM.    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 Extensive, aggressive and progressive infiltrating tumor in the right axilla. Limitations: Edema, pain with movement of arm. Comparison Study: No prior study on file Performing Technologist: Sharion Dove RVS  Examination Guidelines: A complete evaluation includes B-mode imaging, spectral Doppler, color Doppler, and power Doppler as needed of all accessible portions of each  vessel. Bilateral testing is considered an integral part of a complete examination. Limited examinations for reoccurring indications may be performed as noted.  Right Findings: +----------+------------+---------+-----------+----------+--------------+ RIGHT     CompressiblePhasicitySpontaneousProperties   Summary     +----------+------------+---------+-----------+----------+--------------+ IJV           Full       Yes       Yes                             +----------+------------+---------+-----------+----------+--------------+ Subclavian               Yes       Yes                             +----------+------------+---------+-----------+----------+--------------+  Axillary                 Yes       Yes                             +----------+------------+---------+-----------+----------+--------------+ Brachial    Partial                                     Acute      +----------+------------+---------+-----------+----------+--------------+ Radial        None                                      Acute      +----------+------------+---------+-----------+----------+--------------+ Ulnar         None                                      Acute      +----------+------------+---------+-----------+----------+--------------+ Cephalic                                            Not visualized +----------+------------+---------+-----------+----------+--------------+ Basilic                                             Not visualized +----------+------------+---------+-----------+----------+--------------+  Left Findings: +----------+------------+---------+-----------+----------+-------+ LEFT      CompressiblePhasicitySpontaneousPropertiesSummary +----------+------------+---------+-----------+----------+-------+ Subclavian               Yes       Yes                      +----------+------------+---------+-----------+----------+-------+  Summary:   Right: Findings consistent with acute deep vein thrombosis involving the right brachial veins, right radial veins and right ulnar veins. However, unable to visualize the cephalic and basilic veins. Anechoic area in axillary region of chest. Possible lymph node.  Left: No evidence of thrombosis in the subclavian.  *See table(s) above for measurements and observations.  Diagnosing physician: Ruta Hinds MD Electronically signed by Ruta Hinds MD on 05/10/2019 at 7:16:31 PM.    Final    US Abdomen Limited RUQ  Result Date: 05/06/2019 CLINICAL DATA:  Right upper quadrant abdominal pain. EXAM: ULTRASOUND ABDOMEN LIMITED RIGHT UPPER QUADRANT COMPARISON:  CT 04/28/2019 at [facility]: Gallbladder: Distended. No gallstones or wall thickening visualized. No sonographic Murphy sign noted by sonographer. Common bile duct: Diameter: Dilated measuring 10 mm at the porta hepatis, 8 mm distally. Liver: No focal lesion identified. Within normal limits in parenchymal echogenicity. Portal vein is patent on color Doppler imaging with normal direction of blood flow towards the liver. Other: Right pleural effusion, not seen on recent CT. IMPRESSION: 1. Biliary dilatation with common bile duct measuring 10 mm. This is new from CT 1 week ago. Recommend further evaluation with MRCP or ERCP. 2. Gallbladder is mildly distended, but otherwise normal. No gallstones. 3. Right pleural effusion incidentally noted, new from prior CT. Electronically Signed   By: Aurther Loft.D.  On: 05/06/2019 20:50    Microbiology: Recent Results (from the past 240 hour(s))  Blood culture (routine x 2)     Status: None   Collection Time: 05/06/19  9:42 PM   Specimen: BLOOD  Result Value Ref Range Status   Specimen Description BLOOD LEFT ARM  Final   Special Requests   Final    BOTTLES DRAWN AEROBIC AND ANAEROBIC Blood Culture results may not be optimal due to an excessive volume of blood received in culture bottles    Culture   Final    NO GROWTH 5 DAYS Performed at [facility], [place] [address], [place], [place] [address]    Report Status 05/11/2019 FINAL  Final  Respiratory Panel by RT PCR (Flu A&B, Covid) - Nasopharyngeal Swab     Status: None   Collection Time: 05/06/19 10:35 PM   Specimen: Nasopharyngeal Swab  Result Value Ref Range Status   SARS Coronavirus 2 by RT PCR NEGATIVE NEGATIVE Final    Comment: (NOTE) SARS-CoV-2 target nucleic acids are NOT DETECTED. The SARS-CoV-2 RNA is generally detectable in upper respiratoy specimens during the acute phase of infection. The lowest concentration of SARS-CoV-2 viral copies this assay can detect is 131 copies/mL. A negative result does not preclude SARS-Cov-2 infection and should not be used as the sole basis for treatment or other patient management decisions. A negative result may occur with  improper specimen collection/handling, submission of specimen other than nasopharyngeal swab, presence of viral mutation(s) within the areas targeted by this assay, and inadequate number of viral copies (<131 copies/mL). A negative result must be combined with clinical observations, patient history, and epidemiological information. The expected result is Negative. Fact Sheet for Patients:  PinkCheek.be Fact Sheet for Healthcare Providers:  GravelBags.it This test is not yet ap proved or cleared by the [place] FDA and  has been authorized for detection and/or diagnosis of SARS-CoV-2 by FDA under an Emergency Use Authorization (EUA). This EUA will remain  in effect (meaning this test can be used) for the duration of the COVID-19 declaration under Section 564(b)(1) of the Act, 21 U.S.C. section 360bbb-3(b)(1), unless the authorization is terminated or revoked sooner.    Influenza A by PCR NEGATIVE NEGATIVE Final   Influenza B by PCR NEGATIVE NEGATIVE Final    Comment: (NOTE) The Xpert  Xpress SARS-CoV-2/FLU/RSV assay is intended as an aid in  the diagnosis of influenza from Nasopharyngeal swab specimens and  should not be used as a sole basis for treatment. Nasal washings and  aspirates are unacceptable for Xpert Xpress SARS-CoV-2/FLU/RSV  testing. Fact Sheet for Patients: PinkCheek.be Fact Sheet for Healthcare Providers: GravelBags.it This test is not yet approved or cleared by the [place] FDA and  has been authorized for detection and/or diagnosis of SARS-CoV-2 by  FDA under an Emergency Use Authorization (EUA). This EUA will remain  in effect (meaning this test can be used) for the duration of the  Covid-19 declaration under Section 564(b)(1) of the Act, 21  U.S.C. section 360bbb-3(b)(1), unless the authorization is  terminated or revoked. Performed at [facility], [place] [address] [place], [place] [address]   Culture, Urine     Status: None   Collection Time: 05/07/19  1:21 AM   Specimen: Urine, Random  Result Value Ref Range Status   Specimen Description URINE, RANDOM  Final   Special Requests NONE  Final   Culture   Final    NO GROWTH Performed at [facility], [place] [address], [place], [place] [address]    Report Status 05/08/2019 FINAL  Final  Blood culture (routine x 2)     Status: None   Collection Time: 05/07/19  3:13 AM   Specimen: BLOOD  Result Value Ref Range Status   Specimen Description BLOOD LEFT ARM  Final   Special Requests   Final    BOTTLES DRAWN AEROBIC ONLY Blood Culture adequate volume   Culture   Final    NO GROWTH 5 DAYS Performed at [facility], [place] [address] [place], [place] [address]    Report Status 05/12/2019 FINAL  Final  MRSA PCR Screening     Status: None   Collection Time: 05/08/19  5:19 PM   Specimen: Nasopharyngeal  Result Value Ref Range Status   MRSA by PCR NEGATIVE NEGATIVE Final    Comment:        The GeneXpert MRSA Assay  (FDA approved for NASAL specimens only), is one component of a comprehensive MRSA colonization surveillance program. It is not intended to diagnose MRSA infection nor to guide or monitor treatment for MRSA infections. Performed at [facility], [place] [address], [place], [place] [address]      Labs: Basic Metabolic Panel: Recent Labs  Lab 05/09/19 [phone] 05/09/19 0433 05/10/19 0343 05/11/19 1302 05/12/19 0355 05/13/19 0145 05/14/19 0916  NA 139   < > 140 142 141 139 140  K 3.6   < > 3.3* 2.7* 3.1* 3.6 4.0  CL 114*   < > 115* 114* 112* 111 112*  CO2 17*   < > 16* 19* 19* 20* 18*  GLUCOSE 85   < > 118* 146* 130* 152* 99  BUN 15   < > 12 9 7 8 9   CREATININE 1.29*   < > 1.26* 1.33* 1.25* 1.25* 1.28*  CALCIUM 9.7   < > 9.1 8.8* 8.9 8.6* 8.9  MG 1.9  --  1.9 1.9 1.9 2.4  --   PHOS 1.8*  --  2.3* 1.7* 2.2* 2.6  --    < > = values in this interval not displayed.   Liver Function Tests: Recent Labs  Lab 05/10/19 0343 05/11/19 1302 05/12/19 0355 05/13/19 0145 05/14/19 0916  AST 150* 214* 125* 91* 64*  ALT 212* 239* 199* 176* 119*  ALKPHOS 175* 173* 177* 208* 218*  BILITOT 2.0* 2.2* 2.4* 2.9* 5.2*  PROT 5.0* 4.8* 4.8* 5.3* 5.6*  ALBUMIN 1.7* 1.5* 1.4* 1.6* 1.6*   No results for input(s): LIPASE, AMYLASE in the last 168 hours. No results for input(s): AMMONIA in the last 168 hours. CBC: Recent Labs  Lab 05/08/19 0935 05/08/19 0935 05/09/19 0433 05/09/19 0433 05/10/19 0343 05/11/19 0426 05/12/19 0355 05/13/19 0145 05/14/19 0916  WBC 30.5*   < > 29.4*   < > 33.7* 33.2* 33.9* 39.1* 44.2*  NEUTROABS 29.0*  --  24.3*  --  26.9*  --  25.0* 29.8*  --   HGB 11.7*   < > 11.4*   < > 11.8* 11.0* 10.9* 11.5* 12.1  HCT 36.5   < > 34.9*   < > 36.7 33.8* 33.8* 35.1* 37.4  MCV 93.1   < > 89.7   < > 92.0 91.1 90.6 89.1 90.6  PLT 358   < > 341   < > 304 272 208 203 222   < > = values in this interval not displayed.   Cardiac Enzymes: No results for input(s): CKTOTAL,  CKMB, CKMBINDEX, TROPONINI in the last 168 hours. BNP: BNP (last 3 results) No results for input(s): BNP in the last 8760 hours.  ProBNP (last 3 results) No results for input(s): PROBNP in the last 8760 hours.  CBG: No results for input(s): GLUCAP in the last 168 hours.     Signed:  Irine Seal MD.  [facility] 05/14/2019, 3:51 PM

## 2019-05-14 NOTE — Discharge Instructions (Signed)
Your Case manager at [facility] is Dylanna D. 1 [phone] ext [phone]   Kennyth Lose at ext [phone] is covering [place] until May 18, 2019 Both aware hospital case manager was unable to arrange home health      Information on my medicine - ELIQUIS (apixaban)  Why was Eliquis prescribed for you? Eliquis was prescribed to treat blood clots that may have been found in the veins of your legs (deep vein thrombosis) or in your lungs (pulmonary embolism) and to reduce the risk of them occurring again.  What do You need to know about Eliquis ? The starting dose is 10 mg (two 5 mg tablets) taken TWICE daily for the FIRST SEVEN (7) DAYS, then on 4/27  the dose is reduced to ONE 5 mg tablet taken TWICE daily.  Eliquis may be taken with or without food.   Try to take the dose about the same time in the morning and in the evening. If you have difficulty swallowing the tablet whole please discuss with your pharmacist how to take the medication safely.  Take Eliquis exactly as prescribed and DO NOT stop taking Eliquis without talking to the doctor who prescribed the medication.  Stopping may increase your risk of developing a new blood clot.  Refill your prescription before you run out.  After discharge, you should have regular check-up appointments with your healthcare provider that is prescribing your Eliquis.    What do you do if you miss a dose? If a dose of ELIQUIS is not taken at the scheduled time, take it as soon as possible on the same day and twice-daily administration should be resumed. The dose should not be doubled to make up for a missed dose.  Important Safety Information A possible side effect of Eliquis is bleeding. You should call your healthcare provider right away if you experience any of the following: ? Bleeding from an injury or your nose that does not stop. ? Unusual colored urine (red or dark brown) or unusual colored stools (red or black). ? Unusual bruising for unknown  reasons. ? A serious fall or if you hit your head (even if there is no bleeding).  Some medicines may interact with Eliquis and might increase your risk of bleeding or clotting while on Eliquis. To help avoid this, consult your healthcare provider or pharmacist prior to using any new prescription or non-prescription medications, including herbals, vitamins, non-steroidal anti-inflammatory drugs (NSAIDs) and supplements.  This website has more information on Eliquis (apixaban): http://www.eliquis.com/eliquis/home

## 2019-05-18 ENCOUNTER — Inpatient Hospital Stay (HOSPITAL_COMMUNITY)
Admission: EM | Admit: 2019-05-18 | Discharge: 2019-05-21 | DRG: 840 | Disposition: A | Discharge status: Hospice | Payer: Managed Care, Other (non HMO) | Source: Other Acute Inpatient Hospital | Attending: Internal Medicine | Admitting: Internal Medicine

## 2019-05-18 DIAGNOSIS — C7989 Secondary malignant neoplasm of other specified sites: Secondary | ICD-10-CM | POA: Diagnosis present

## 2019-05-18 DIAGNOSIS — E876 Hypokalemia: Secondary | ICD-10-CM | POA: Diagnosis present

## 2019-05-18 DIAGNOSIS — D649 Anemia, unspecified: Secondary | ICD-10-CM | POA: Diagnosis present

## 2019-05-18 DIAGNOSIS — Z20822 Contact with and (suspected) exposure to covid-19: Secondary | ICD-10-CM | POA: Diagnosis present

## 2019-05-18 DIAGNOSIS — C439 Malignant melanoma of skin, unspecified: Secondary | ICD-10-CM | POA: Diagnosis present

## 2019-05-18 DIAGNOSIS — I82621 Acute embolism and thrombosis of deep veins of right upper extremity: Secondary | ICD-10-CM | POA: Diagnosis present

## 2019-05-18 DIAGNOSIS — Z66 Do not resuscitate: Secondary | ICD-10-CM | POA: Diagnosis present

## 2019-05-18 DIAGNOSIS — F329 Major depressive disorder, single episode, unspecified: Secondary | ICD-10-CM | POA: Diagnosis present

## 2019-05-18 DIAGNOSIS — K719 Toxic liver disease, unspecified: Secondary | ICD-10-CM | POA: Diagnosis present

## 2019-05-18 DIAGNOSIS — R7401 Elevation of levels of liver transaminase levels: Secondary | ICD-10-CM | POA: Diagnosis not present

## 2019-05-18 DIAGNOSIS — Z7189 Other specified counseling: Secondary | ICD-10-CM | POA: Diagnosis not present

## 2019-05-18 DIAGNOSIS — Z87891 Personal history of nicotine dependence: Secondary | ICD-10-CM

## 2019-05-18 DIAGNOSIS — N179 Acute kidney failure, unspecified: Secondary | ICD-10-CM | POA: Diagnosis present

## 2019-05-18 DIAGNOSIS — E872 Acidosis: Secondary | ICD-10-CM | POA: Diagnosis present

## 2019-05-18 DIAGNOSIS — K7200 Acute and subacute hepatic failure without coma: Secondary | ICD-10-CM | POA: Diagnosis present

## 2019-05-18 DIAGNOSIS — D696 Thrombocytopenia, unspecified: Secondary | ICD-10-CM | POA: Diagnosis present

## 2019-05-18 DIAGNOSIS — I82A11 Acute embolism and thrombosis of right axillary vein: Secondary | ICD-10-CM | POA: Diagnosis present

## 2019-05-18 DIAGNOSIS — Z888 Allergy status to other drugs, medicaments and biological substances status: Secondary | ICD-10-CM

## 2019-05-18 DIAGNOSIS — R188 Other ascites: Secondary | ICD-10-CM | POA: Diagnosis present

## 2019-05-18 DIAGNOSIS — R6510 Systemic inflammatory response syndrome (SIRS) of non-infectious origin without acute organ dysfunction: Secondary | ICD-10-CM

## 2019-05-18 DIAGNOSIS — G9341 Metabolic encephalopathy: Secondary | ICD-10-CM | POA: Diagnosis present

## 2019-05-18 DIAGNOSIS — T50905A Adverse effect of unspecified drugs, medicaments and biological substances, initial encounter: Secondary | ICD-10-CM | POA: Diagnosis present

## 2019-05-18 DIAGNOSIS — E878 Other disorders of electrolyte and fluid balance, not elsewhere classified: Secondary | ICD-10-CM | POA: Diagnosis present

## 2019-05-18 DIAGNOSIS — Z803 Family history of malignant neoplasm of breast: Secondary | ICD-10-CM

## 2019-05-18 DIAGNOSIS — D689 Coagulation defect, unspecified: Secondary | ICD-10-CM | POA: Diagnosis present

## 2019-05-18 DIAGNOSIS — J910 Malignant pleural effusion: Secondary | ICD-10-CM | POA: Diagnosis present

## 2019-05-18 DIAGNOSIS — Z90710 Acquired absence of both cervix and uterus: Secondary | ICD-10-CM

## 2019-05-18 DIAGNOSIS — K831 Obstruction of bile duct: Secondary | ICD-10-CM | POA: Diagnosis present

## 2019-05-18 DIAGNOSIS — C773 Secondary and unspecified malignant neoplasm of axilla and upper limb lymph nodes: Principal | ICD-10-CM | POA: Diagnosis present

## 2019-05-18 DIAGNOSIS — E43 Unspecified severe protein-calorie malnutrition: Secondary | ICD-10-CM | POA: Diagnosis present

## 2019-05-18 DIAGNOSIS — Z6825 Body mass index (BMI) 25.0-25.9, adult: Secondary | ICD-10-CM

## 2019-05-18 DIAGNOSIS — R17 Unspecified jaundice: Secondary | ICD-10-CM | POA: Diagnosis present

## 2019-05-18 DIAGNOSIS — F809 Developmental disorder of speech and language, unspecified: Secondary | ICD-10-CM | POA: Diagnosis present

## 2019-05-18 DIAGNOSIS — K5900 Constipation, unspecified: Secondary | ICD-10-CM | POA: Diagnosis present

## 2019-05-18 DIAGNOSIS — E870 Hyperosmolality and hypernatremia: Secondary | ICD-10-CM | POA: Diagnosis present

## 2019-05-18 DIAGNOSIS — Z79899 Other long term (current) drug therapy: Secondary | ICD-10-CM

## 2019-05-18 DIAGNOSIS — K828 Other specified diseases of gallbladder: Secondary | ICD-10-CM | POA: Diagnosis present

## 2019-05-18 DIAGNOSIS — I959 Hypotension, unspecified: Secondary | ICD-10-CM | POA: Diagnosis present

## 2019-05-18 DIAGNOSIS — Z515 Encounter for palliative care: Secondary | ICD-10-CM | POA: Diagnosis not present

## 2019-05-18 DIAGNOSIS — J189 Pneumonia, unspecified organism: Secondary | ICD-10-CM | POA: Diagnosis present

## 2019-05-18 DIAGNOSIS — Z7901 Long term (current) use of anticoagulants: Secondary | ICD-10-CM

## 2019-05-18 DIAGNOSIS — C779 Secondary and unspecified malignant neoplasm of lymph node, unspecified: Secondary | ICD-10-CM | POA: Diagnosis present

## 2019-05-18 DIAGNOSIS — R7989 Other specified abnormal findings of blood chemistry: Secondary | ICD-10-CM | POA: Diagnosis not present

## 2019-05-18 DIAGNOSIS — A419 Sepsis, unspecified organism: Secondary | ICD-10-CM

## 2019-05-18 DIAGNOSIS — R627 Adult failure to thrive: Secondary | ICD-10-CM | POA: Diagnosis present

## 2019-05-18 LAB — COMPREHENSIVE METABOLIC PANEL
ALT: 40 U/L (ref 0–44)
AST: 52 U/L — ABNORMAL HIGH (ref 15–41)
Albumin: 1.5 g/dL — ABNORMAL LOW (ref 3.5–5.0)
Alkaline Phosphatase: 291 U/L — ABNORMAL HIGH (ref 38–126)
Anion gap: 11 (ref 5–15)
BUN: 19 mg/dL (ref 6–20)
CO2: 14 mmol/L — ABNORMAL LOW (ref 22–32)
Calcium: 10.6 mg/dL — ABNORMAL HIGH (ref 8.9–10.3)
Chloride: 114 mmol/L — ABNORMAL HIGH (ref 98–111)
Creatinine, Ser: 1.59 mg/dL — ABNORMAL HIGH (ref 0.44–1.00)
GFR calc Af Amer: 44 mL/min — ABNORMAL LOW (ref >60)
GFR calc non Af Amer: 38 mL/min — ABNORMAL LOW (ref >60)
Glucose, Bld: 105 mg/dL — ABNORMAL HIGH (ref 70–99)
Potassium: 2.6 mmol/L — CL (ref 3.5–5.1)
Sodium: 139 mmol/L (ref 135–145)
Total Bilirubin: 11.7 mg/dL — ABNORMAL HIGH (ref 0.3–1.2)
Total Protein: 6.2 g/dL — ABNORMAL LOW (ref 6.5–8.1)

## 2019-05-18 LAB — CBC WITH DIFFERENTIAL/PLATELET
Abs Immature Granulocytes: 0 10*3/uL (ref 0.00–0.07)
Basophils Absolute: 0.6 10*3/uL — ABNORMAL HIGH (ref 0.0–0.1)
Basophils Relative: 1 %
Eosinophils Absolute: 0 10*3/uL (ref 0.0–0.5)
Eosinophils Relative: 0 %
HCT: 36.5 % (ref 36.0–46.0)
Hemoglobin: 11.9 g/dL — ABNORMAL LOW (ref 12.0–15.0)
Lymphocytes Relative: 2 %
Lymphs Abs: 1.2 10*3/uL (ref 0.7–4.0)
MCH: 29.8 pg (ref 26.0–34.0)
MCHC: 32.6 g/dL (ref 30.0–36.0)
MCV: 91.5 fL (ref 80.0–100.0)
Monocytes Absolute: 6.8 10*3/uL — ABNORMAL HIGH (ref 0.1–1.0)
Monocytes Relative: 11 %
Neutro Abs: 53.1 10*3/uL — ABNORMAL HIGH (ref 1.7–7.7)
Neutrophils Relative %: 86 %
Platelets: 177 10*3/uL (ref 150–400)
RBC: 3.99 MIL/uL (ref 3.87–5.11)
RDW: 19.6 % — ABNORMAL HIGH (ref 11.5–15.5)
WBC: 61.8 10*3/uL (ref 4.0–10.5)
nRBC: 1.7 % — ABNORMAL HIGH (ref 0.0–0.2)

## 2019-05-18 LAB — LACTIC ACID, PLASMA
Lactic Acid, Venous: 4.6 mmol/L (ref 0.5–1.9)
Lactic Acid, Venous: 5.1 mmol/L (ref 0.5–1.9)

## 2019-05-18 LAB — PROTIME-INR
INR: 2.7 — ABNORMAL HIGH (ref 0.8–1.2)
Prothrombin Time: 29 seconds — ABNORMAL HIGH (ref 11.4–15.2)

## 2019-05-18 LAB — APTT: aPTT: 54 seconds — ABNORMAL HIGH (ref 24–36)

## 2019-05-18 LAB — AMMONIA: Ammonia: 62 umol/L — ABNORMAL HIGH (ref 9–35)

## 2019-05-18 LAB — PTH-RELATED PEPTIDE: PTH-related peptide: 5.1 pmol/L — ABNORMAL HIGH

## 2019-05-18 LAB — HEPARIN LEVEL (UNFRACTIONATED): Heparin Unfractionated: >2.2 IU/mL — ABNORMAL HIGH (ref 0.30–0.70)

## 2019-05-18 MED ORDER — POLYETHYLENE GLYCOL 3350 17 G PO PACK: 17.0000 g | PACK | Freq: Every day | ORAL | Status: DC | PRN

## 2019-05-18 MED ORDER — LACTATED RINGERS IV SOLN: INTRAVENOUS | Status: DC

## 2019-05-18 MED ORDER — MAGNESIUM HYDROXIDE 400 MG/5ML PO SUSP: 15.0000 mL | Freq: Two times a day (BID) | ORAL | Status: DC | PRN

## 2019-05-18 MED ORDER — SODIUM CHLORIDE 0.9 % IV SOLN
2.0000 g | Freq: Two times a day (BID) | INTRAVENOUS | Status: DC
  Administered 2019-05-18 – 2019-05-20 (×5): 2 g via INTRAVENOUS
  Filled 2019-05-18 (×7): qty 2

## 2019-05-18 MED ORDER — LACTULOSE 10 GM/15ML PO SOLN: 20.0000 g | Freq: Two times a day (BID) | ORAL | Status: DC | PRN

## 2019-05-18 MED ORDER — SENNOSIDES-DOCUSATE SODIUM 8.6-50 MG PO TABS
1.0000 | ORAL_TABLET | Freq: Two times a day (BID) | ORAL | Status: DC
  Filled 2019-05-18: qty 1

## 2019-05-18 MED ORDER — MORPHINE SULFATE (PF) 4 MG/ML IV SOLN
4.0000 mg | INTRAVENOUS | Status: DC | PRN
  Administered 2019-05-18 – 2019-05-21 (×11): 4 mg via INTRAVENOUS
  Filled 2019-05-18 (×12): qty 1

## 2019-05-18 MED ORDER — AMITRIPTYLINE HCL 10 MG PO TABS
20.0000 mg | ORAL_TABLET | Freq: Every evening | ORAL | Status: DC | PRN
  Administered 2019-05-18 – 2019-05-19 (×2): 20 mg via ORAL
  Filled 2019-05-18 (×3): qty 2

## 2019-05-18 MED ORDER — POTASSIUM CHLORIDE 10 MEQ/50ML IV SOLN
10.0000 meq | INTRAVENOUS | Status: AC
  Administered 2019-05-18 – 2019-05-19 (×6): 10 meq via INTRAVENOUS
  Filled 2019-05-18 (×7): qty 50

## 2019-05-18 MED ORDER — ONDANSETRON HCL 4 MG/2ML IJ SOLN
4.0000 mg | Freq: Four times a day (QID) | INTRAMUSCULAR | Status: DC | PRN
  Administered 2019-05-20 (×2): 4 mg via INTRAVENOUS
  Filled 2019-05-18 (×2): qty 2

## 2019-05-18 MED ORDER — SODIUM CHLORIDE 0.9% FLUSH
3.0000 mL | Freq: Two times a day (BID) | INTRAVENOUS | Status: DC
  Administered 2019-05-20 (×2): 3 mL via INTRAVENOUS

## 2019-05-18 MED ORDER — HEPARIN (PORCINE) 25000 UT/250ML-% IV SOLN
600.0000 [IU]/h | INTRAVENOUS | Status: DC
  Administered 2019-05-18: 19:00:00 400 [IU]/h via INTRAVENOUS
  Administered 2019-05-19: 800 [IU]/h via INTRAVENOUS
  Administered 2019-05-21: 600 [IU]/h via INTRAVENOUS
  Filled 2019-05-18 (×2): qty 250

## 2019-05-18 MED ORDER — ONDANSETRON HCL 4 MG PO TABS: 4.0000 mg | ORAL_TABLET | Freq: Four times a day (QID) | ORAL | Status: DC | PRN

## 2019-05-18 NOTE — Progress Notes (Signed)
Rule for Heparin Indication: atrial fibrillation  Allergies  Allergen Reactions  . Gabapentin Other (See Comments)    "Made me feel odd, so I stopped taking it"    Patient Measurements: Height: 5\' 3"  (160 cm) Weight: 145 lb 15.1 oz (66.2 kg) IBW/kg (Calculated) : 52.4 Heparin Dosing Weight: 65.7 kg  Vital Signs: Temp: 99 F (37.2 C) (03/23 1632) Temp Source: Oral (03/23 1632) BP: 122/76 (03/23 1700) Pulse Rate: 132 (03/23 1700)  Labs: No results for input(s): HGB, HCT, PLT, APTT, LABPROT, INR, HEPARINUNFRC, HEPRLOWMOCWT, CREATININE, CKTOTAL, CKMB, TROPONINIHS in the last 72 hours.  Estimated Creatinine Clearance: 49.1 mL/min (A) (by C-G formula based on SCr of 1.28 mg/dL (H)).   Medical History: Past Medical History:  Diagnosis Date  . No pertinent past medical history     Medications:  Scheduled:  . senna-docusate  1 tablet Oral BID  . sodium chloride flush  3 mL Intravenous Q12H    Assessment: Patient is a 79 yof that is being admitted for metastatic axillary melanoma, hx of DVT, and liver failure. The patient was on apixaban at home however was started on a heparin drip at the transferring hospital. Transferring hospital started Heparin @ 400 units/hr at ~1000.  Goal of Therapy:  Heparin level 0.3-0.7 units/ml aPTT 66-102 seconds Monitor platelets by anticoagulation protocol: Yes   Plan:  - Obtained Heparin level and aPTT  - Baseline aPTT was 51 prior to having the heparin drip started at 400 units/hr - aPTT was subtherapeutic at 53 as expected heparin level was supra-therapeutic due to Apixaban  - Will increase Heparin drip to 950 units/hr - Will continue to monitor patient's heparin based on aPTT until the Heparin level correlates.  - aPTT in ~ 6 hours  - Monitor patient for s/s of bleeding and CBC while on heparin   Duanne Limerick PharmD. BCPS  05/18/2019,6:49 PM

## 2019-05-18 NOTE — Progress Notes (Signed)
Pharmacy Antibiotic Note  Angelica Dunn is a 49 y.o. female admitted on 05/18/2019 with pneumonia.  Pharmacy has been consulted for Cefepime dosing.      Temp (24hrs), Avg:99 F (37.2 C), Min:99 F (37.2 C), Max:99 F (37.2 C)  Recent Labs  Lab 05/12/19 0355 05/13/19 0145 05/14/19 0916  WBC 33.9* 39.1* 44.2*  CREATININE 1.25* 1.25* 1.28*    Estimated Creatinine Clearance: 49.1 mL/min (A) (by C-G formula based on SCr of 1.28 mg/dL (H)).    Allergies  Allergen Reactions  . Gabapentin Other (See Comments)    "Made me feel odd, so I stopped taking it"    Antimicrobials this admission: 3/23 Cefepime >>   Dose adjustments this admission: N/a  Microbiology results: Pending    Plan:  - With elevated lactate and WBC combined with the concern for possible RLL infiltrate will start cefepime, however this is likely related to a malignant effusion    - Cefepime 2g IV q12h  - Monitor patients renal function and urine output  - De-escalate ABX when appropriate   Thank you for allowing pharmacy to be a part of this patient's care.  Duanne Limerick PharmD. BCPS 05/18/2019 6:19 PM

## 2019-05-18 NOTE — H&P (Signed)
History and Physical    Angelica Dunn [id number] DOB: 1970/10/10 DOA: 05/18/2019  PCP: Renaldo Reel, PA Consultants:  [facility]; Linninger - surgery; Bobby Rumpf - oncology Patient coming from:  Home; NOK: Mother, Dianey Belarde, [phone]  Chief Complaint:   HPI: Angelica Dunn is a 49 y.o. 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  She underwent R axillary lymph node dissection on 03/31/19 with port placement.  Post-operatively, she has had significantly elevated LFTs and also had an incidental R-sided pleural effusion and DVT, on Eliquis.  She was hospitalized at [facility] from 3/11-19 with plan for outpatient oncology f/u in 3-4 days and BP reassessment.  She returned to [facility] yesterday (3/22) with jaundice, increased fatigue, R breast swelling, and persistent upper and lower extremity edema.  She was found to have tachycardia to the 120s with WBC 48.5, increased from 34.  LFTs were elevated (Bili 8, INR 8.3 and previously 1.3).  CT C/A/P with worsening cancer, pleural effusion, ascites, no apparent source of infection.  Her mother reports that she has been primarily failing to thrive with decreased PO intake, increased somnolence, jaundice.  The patient complains of RUE pain and swelling.  She has intermittent confusion.    ED Course:  Given IVF, Cefepime/Vanc.  COVID negative.  Accepted in transfer to SDU for worsening liver failure.  Review of Systems: As per HPI; otherwise review of systems reviewed and negative.  Limited by confusion and seriously ill appearance.   Past Medical History:  Diagnosis Date  . No pertinent past medical history     Past Surgical History:  Procedure Laterality Date  . BLADDER SUSPENSION  10/09/2011   Procedure: TRANSVAGINAL TAPE (TVT) PROCEDURE;  Surgeon: Daria Pastures, MD;  Location: [facility];  Service: Gynecology;  Laterality: N/A;  . BREAST EXCISIONAL BIOPSY  Right   . CYSTOCELE REPAIR  10/09/2011   Procedure: ANTERIOR REPAIR (CYSTOCELE);  Surgeon: Daria Pastures, MD;  Location: [facility];  Service: Gynecology;  Laterality: N/A;  . CYSTOSCOPY  10/09/2011   Procedure: CYSTOSCOPY;  Surgeon: Daria Pastures, MD;  Location: [facility];  Service: Gynecology;  Laterality: N/A;  . VAGINAL HYSTERECTOMY  10/09/2011   Procedure: HYSTERECTOMY VAGINAL;  Surgeon: Daria Pastures, MD;  Location: [facility];  Service: Gynecology;  Laterality: N/A;    Social History   Socioeconomic History  . Marital status: Divorced    Spouse name: Not on file  . Number of children: Not on file  . Years of education: Not on file  . Highest education level: Not on file  Occupational History  . Not on file  Tobacco Use  . Smoking status: Former Research scientist (life sciences)  . Smokeless tobacco: Never Used  Substance and Sexual Activity  . Alcohol use: Not Currently  . Drug use: No  . Sexual activity: Not on file  Other Topics Concern  . Not on file  Social History Narrative  . Not on file   Social Determinants of Health   Financial Resource Strain:   . Difficulty of Paying Living Expenses:   Food Insecurity:   . Worried About Charity fundraiser in the Last Year:   . Arboriculturist in the Last Year:   Transportation Needs:   . Film/video editor (Medical):   Marland Kitchen Lack of Transportation (Non-Medical):   Physical Activity:   . Days of Exercise per Week:   . Minutes of Exercise per Session:  Stress:   . Feeling of Stress :   Social Connections:   . Frequency of Communication with Friends and Family:   . Frequency of Social Gatherings with Friends and Family:   . Attends Religious Services:   . Active Member of Clubs or Organizations:   . Attends Archivist Meetings:   Marland Kitchen Marital Status:   Intimate Partner Violence:   . Fear of Current or Ex-Partner:   . Emotionally Abused:   Marland Kitchen Physically Abused:   . Sexually Abused:     Allergies  Allergen Reactions  .  Gabapentin Other (See Comments)    "Made me feel odd, so I stopped taking it"    Family History  Problem Relation Age of Onset  . Breast cancer Maternal Aunt   . Breast cancer Cousin     Prior to Admission medications   Medication Sig Start Date End Date Taking? Authorizing Provider  acetaminophen (TYLENOL) 325 MG tablet Take 325-650 mg by mouth every 6 (six) hours as needed for mild pain or headache.    [provider]  amitriptyline (ELAVIL) 10 MG tablet Take 10-20 mg by mouth at bedtime as needed for sleep.     [provider]  apixaban (ELIQUIS) 5 MG TABS tablet Take 2 tablets (10 mg total) by mouth 2 (two) times daily for 7 days. 05/14/19 05/21/19  Eugenie Filler, MD  apixaban (ELIQUIS) 5 MG TABS tablet Take 1 tablet (5 mg total) by mouth 2 (two) times daily. 05/20/19   Eugenie Filler, MD  ezetimibe (ZETIA) 10 MG tablet Take 1 tablet (10 mg total) by mouth daily. 05/21/19   Eugenie Filler, MD  feeding supplement, ENSURE ENLIVE, (ENSURE ENLIVE) LIQD Take 237 mLs by mouth 2 (two) times daily between meals. 05/15/19   Eugenie Filler, MD  fexofenadine (ALLEGRA) 180 MG tablet Take 180 mg by mouth daily as needed for rhinitis (or seasonal allergies).    [provider]  magnesium hydroxide (MILK OF MAGNESIA) 400 MG/5ML suspension Take 15-30 mLs by mouth 2 (two) times daily as needed for mild constipation or moderate constipation.     [provider]  morphine (MSIR) 15 MG tablet Take 0.5-1 tablets (7.5-15 mg total) by mouth every 4 (four) hours as needed for severe pain. 05/14/19   Eugenie Filler, MD  Multiple Vitamin (MULTIVITAMIN WITH MINERALS) TABS tablet Take 1 tablet by mouth daily. 05/15/19   Eugenie Filler, MD  ondansetron (ZOFRAN-ODT) 4 MG disintegrating tablet Take 4 mg by mouth every 8 (eight) hours as needed for nausea or vomiting (DISSOLVE ORALLY).    [provider]  polyethylene glycol (MIRALAX / GLYCOLAX) 17 g packet  Take 17 g by mouth daily as needed for mild constipation. 05/14/19   Eugenie Filler, MD  senna-docusate (SENOKOT-S) 8.6-50 MG tablet Take 1 tablet by mouth 2 (two) times daily. 05/14/19   Eugenie Filler, MD  traMADol (ULTRAM) 50 MG tablet Take 50 mg by mouth every 6 (six) hours as needed (for pain).     [provider]  triamcinolone (NASACORT ALLERGY 24HR) 55 MCG/ACT AERO nasal inhaler Place 2 sprays into the nose daily as needed (for seasonal allergies).    [provider]    Physical Exam: Vitals:   05/18/19 1629 05/18/19 1632 05/18/19 1700  BP: 118/78 120/74 122/76  Pulse: (!) 132 (!) 132 (!) 132  Resp: (!) 21 (!) 25 20  Temp:  99 F (37.2 C)   TempSrc:  Oral   SpO2: 98% 100% 99%     . General:  Appears calm and comfortable and is NAD . Eyes:  PERRL, EOMI, normal lids, iris . ENT:  grossly normal hearing, lips & tongue, mmm; appropriate dentition . Neck:  no LAD, masses or thyromegaly; no carotid bruits . Cardiovascular:  RRR, no m/r/g. No LE edema.  Marland Kitchen Respiratory:   CTA bilaterally with no wheezes/rales/rhonchi.  Normal respiratory effort. . Abdomen:  Mildly distended with soft fluid wave but NT . Skin:  RUE is edematous without erythema . Musculoskeletal:  RUE lymphedema, no bony abnormality . Psychiatric:  Very flat mood and affect, speech fluent and appropriate interspersed with nonsensical and clearly confused speech . Neurologic:  Unable to effectively perform    Radiological Exams on Admission: No results found.   From [facility]: CXR with increasing R base infiltrate/effusion CT C/A/P: increased size of R axillary mass with R chest wall invasion Increased size of R pleural effusion Small-moderate ascites   EKG: Independently reviewed.  Sinus tachycardia with rate 124; nonspecific ST changes with no evidence of acute ischemia   Labs on Admission: I have personally reviewed the available labs and imaging studies at the time of the  admission.  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) Active Problems:   DVT of right axillary vein, acute (HCC)   Transaminitis   Hypercalcemia   Constipation   Metastatic axillary melanoma -This is a tragic patient who appears to have an aggressive recurrence of her melanoma -She was admitted to [facility] and discharged on 3/19 with plan for outpatient oncology f/u to start treatment but appears to have rapidly progressive disease -She was accepted in transfer for acute liver failure but this is not her greatest complaint; she complains of significant RUE pain and edema -She has R chest wall tumor invasion with lymph node invasion and her RUE edema appears to be due to lymphedema -She also had a recent RUE DVT which could be contributing - but she has been on Eliquis since d/c and was started on Heparin drip once her INR was corrected at [facility] and so this would not be expected to worsen if related to the DVT -She also has worsening LFTs with markedly elevated INR (corrected) and bilirubin steadily worsening (2.2 on 3/16, 2.4 on 3/17, 5.3 on 3/18, 5.6 on 3/19, 7.8 on 3/22...) -Unfortunately, this appears to be a terminal condition at this time and hospice seems the most likely outcome for this young woman; palliative care consultation has been requested -Her mother and the patient are in agreement with DNR at this time -Will give IVF, allow her to eat if she is able, and call GI and oncology to see -I have spoken with Dr. Fuller Plan and he will plan to see her tomorrow -I have also spoken with Dr. Benay Spice; he provided me with the pager number for Dr. Bobby Rumpf at [facility] but I did not receive a call back and Dr. Benay Spice called back to report that Dr. Burr Medico saw  the patient here during her last hospitalization and will see her again tomorrow -Elevated lactate and marked leukocytosis is not thought to be related to sepsis but will continue Cefepime for possible RLL infiltrate - although this  much more likely related to a malignant effusion; she did receive 7 days of IV Cefepime during her last hospitalization as well   DVT, R axillary vein -Hold Eliquis -Will treat with Heparin for now -On MSIR for pain control but will change to IV morphine and would not hesitate to escalate the dose  Apparent liver failure -Patient was accepted in transfer due to liver failure, for GI consultation -Patient d/w Dr. Fuller Plan, who will see her in the AM -Unfortunately, her liver failure does not appear to be obstructive in nature and instead appears likely related to her overall tumor burden -Will treat with Lactulose for possible hepatic encephalopathy  -INR was 8.3 at [facility] and was corrected with vitamin K; recheck was 2.5  Hypercalcemia -Normal calcium but corrected with albumin is high -Will hydrate and recheck in AM -She did receive Zometa during prior hospitalization  Constipation -Continue bowel regimen in addition to lactulose    Note: This patient has been tested and is negative for the novel coronavirus COVID-19.  DVT prophylaxis: Heparin Code Status:  DNR - confirmed with patient/family Family Communication: Her mother was present throughout evaluation. Disposition Plan: She is anticipated to d/c to Hospice. Consults called: GI; Oncology; Palliative Care Admission status: Admit - It is my clinical opinion that admission to INPATIENT is reasonable and necessary because of the expectation that this patient will require hospital care that crosses at least 2 midnights to treat this condition based on the medical complexity of the problems presented.  Given the aforementioned information, the predictability of an adverse outcome is felt to be significant.      Karmen Bongo MD [facility]   How to contact the [facility] Attending or Consulting provider [place] or covering provider during after hours [place], for this patient?  1. Check the care team in [facility] and look for a) attending/consulting [facility] provider listed and b) the [facility] team listed 2. Log into [email or web address] and use 's universal password to access. If you do not have the password, please contact the hospital operator. 3. Locate the [facility] provider you are looking for under [facility] and page to a number that you can be directly reached. 4. If you still have difficulty reaching the provider, please page the [facility] (Director on Call) for the Hospitalists listed on amion for assistance.   05/18/2019, 6:08 PM

## 2019-05-19 DIAGNOSIS — C779 Secondary and unspecified malignant neoplasm of lymph node, unspecified: Secondary | ICD-10-CM

## 2019-05-19 DIAGNOSIS — R7401 Elevation of levels of liver transaminase levels: Secondary | ICD-10-CM

## 2019-05-19 DIAGNOSIS — C439 Malignant melanoma of skin, unspecified: Secondary | ICD-10-CM

## 2019-05-19 DIAGNOSIS — K5900 Constipation, unspecified: Secondary | ICD-10-CM

## 2019-05-19 DIAGNOSIS — R7989 Other specified abnormal findings of blood chemistry: Secondary | ICD-10-CM

## 2019-05-19 LAB — CBC
HCT: 32.7 % — ABNORMAL LOW (ref 36.0–46.0)
Hemoglobin: 10.6 g/dL — ABNORMAL LOW (ref 12.0–15.0)
MCH: 30.3 pg (ref 26.0–34.0)
MCHC: 32.4 g/dL (ref 30.0–36.0)
MCV: 93.4 fL (ref 80.0–100.0)
Platelets: 124 K/uL — ABNORMAL LOW (ref 150–400)
RBC: 3.5 MIL/uL — ABNORMAL LOW (ref 3.87–5.11)
RDW: 19.8 % — ABNORMAL HIGH (ref 11.5–15.5)
WBC: 60.9 K/uL (ref 4.0–10.5)
nRBC: 1.7 % — ABNORMAL HIGH (ref 0.0–0.2)

## 2019-05-19 LAB — COMPREHENSIVE METABOLIC PANEL
ALT: 39 U/L (ref 0–44)
AST: 72 U/L — ABNORMAL HIGH (ref 15–41)
Albumin: 1.4 g/dL — ABNORMAL LOW (ref 3.5–5.0)
Alkaline Phosphatase: 260 U/L — ABNORMAL HIGH (ref 38–126)
Anion gap: 13 (ref 5–15)
BUN: 17 mg/dL (ref 6–20)
CO2: 12 mmol/L — ABNORMAL LOW (ref 22–32)
Calcium: 10.6 mg/dL — ABNORMAL HIGH (ref 8.9–10.3)
Chloride: 116 mmol/L — ABNORMAL HIGH (ref 98–111)
Creatinine, Ser: 1.65 mg/dL — ABNORMAL HIGH (ref 0.44–1.00)
GFR calc Af Amer: 42 mL/min — ABNORMAL LOW (ref >60)
GFR calc non Af Amer: 36 mL/min — ABNORMAL LOW (ref >60)
Glucose, Bld: 78 mg/dL (ref 70–99)
Potassium: 4.4 mmol/L (ref 3.5–5.1)
Sodium: 141 mmol/L (ref 135–145)
Total Bilirubin: 11.8 mg/dL — ABNORMAL HIGH (ref 0.3–1.2)
Total Protein: 5.7 g/dL — ABNORMAL LOW (ref 6.5–8.1)

## 2019-05-19 LAB — PROTIME-INR
INR: 3.1 — ABNORMAL HIGH (ref 0.8–1.2)
Prothrombin Time: 31.5 seconds — ABNORMAL HIGH (ref 11.4–15.2)

## 2019-05-19 LAB — APTT
aPTT: 130 seconds — ABNORMAL HIGH (ref 24–36)
aPTT: 135 seconds — ABNORMAL HIGH (ref 24–36)

## 2019-05-19 MED ORDER — PHYTONADIONE 5 MG PO TABS
5.0000 mg | ORAL_TABLET | Freq: Every day | ORAL | Status: DC
  Administered 2019-05-19: 10:00:00 5 mg via ORAL
  Filled 2019-05-19: qty 1

## 2019-05-19 MED ORDER — VITAMIN K1 10 MG/ML IJ SOLN
5.0000 mg | Freq: Every day | INTRAVENOUS | Status: DC
  Administered 2019-05-20: 11:00:00 5 mg via INTRAVENOUS
  Filled 2019-05-19 (×2): qty 0.5

## 2019-05-19 MED ORDER — SODIUM CHLORIDE 0.9 % IV SOLN
30.0000 mg | Freq: Once | INTRAVENOUS | Status: AC
  Administered 2019-05-20: 30 mg via INTRAVENOUS
  Filled 2019-05-19: qty 10

## 2019-05-19 MED ORDER — CHLORHEXIDINE GLUCONATE CLOTH 2 % EX PADS
6.0000 | MEDICATED_PAD | Freq: Every day | CUTANEOUS | Status: DC
  Administered 2019-05-19 – 2019-05-20 (×2): 6 via TOPICAL

## 2019-05-19 MED ORDER — IBUPROFEN 600 MG PO TABS
600.0000 mg | ORAL_TABLET | Freq: Four times a day (QID) | ORAL | Status: DC | PRN
  Administered 2019-05-19: 600 mg via ORAL
  Filled 2019-05-19: qty 1

## 2019-05-19 NOTE — Progress Notes (Signed)
ANTICOAGULATION CONSULT NOTE - Follow Up Consult  Pharmacy Consult for heparin Indication: atrial fibrillation  Labs: Recent Labs    05/18/19 1840 05/19/19 0537 05/19/19 0601  HGB 11.9*  --   --   HCT 36.5  --   --   PLT 177  --   --   APTT 54*  --  130*  LABPROT 29.0*  --  31.5*  INR 2.7*  --  3.1*  HEPARINUNFRC >2.20*  --   --   CREATININE 1.59* 1.65*  --     Assessment: 48yo female supratherapeutic on heparin after rate change; no gtt issues or signs of bleeding per RN.  Goal of Therapy:  aPTT 66-102 seconds   Plan:  Will decrease heparin gtt by 2-3 units/kg/hr to 800 units/hr and check PTT in 8 hours.    Wynona Neat, PharmD, BCPS  05/19/2019,7:02 AM

## 2019-05-19 NOTE — Progress Notes (Signed)
Spoke with and updated pt mother on plan of care. All questions answered.  Arletta Bale, RN

## 2019-05-19 NOTE — Progress Notes (Signed)
Pt refused labs and stated that she did not want to be stuck. Informed pt that I could not draw labs off of her port due to Heparin infusing. Pt verbalized understanding. Will inform pharmacy.   Arletta Bale, RN

## 2019-05-19 NOTE — Progress Notes (Signed)
ANTICOAGULATION CONSULT NOTE - Follow Up Consult  Pharmacy Consult for heparin Indication: atrial fibrillation  Labs: Recent Labs    05/18/19 1840 05/19/19 0537 05/19/19 0601 05/19/19 1400  HGB 11.9*  --  10.6*  --   HCT 36.5  --  32.7*  --   PLT 177  --  124*  --   APTT 54*  --  130* 135*  LABPROT 29.0*  --  31.5*  --   INR 2.7*  --  3.1*  --   HEPARINUNFRC >2.20*  --   --   --   CREATININE 1.59* 1.65*  --   --     Assessment: 49yo female on heparin for history of DVT, previously on apixaban but this was stopped due to elevated LFTs.   Aptt still above goal at 135s, the accuracy is questionable as lab was drawn from a port after flushing x 2. She had much higher requirements previously but she is also having significant liver dysfunction. Will lower heparin rate and hope to recheck with am labs.   No bleeding issues noted.   Goal of Therapy:  aPTT 66-102 seconds   Plan:  Decrease heparin to 600 units/hr Recheck aptt with am labs  Erin Hearing PharmD., BCPS Clinical Pharmacist 05/19/2019 4:57 PM

## 2019-05-19 NOTE — Progress Notes (Signed)
Spoke with pharmacy and was notified to stop the Heparin, flush the line, and draw labs from the central line. This RN was able to pull PTT from pt portacath after stopping Heparin infusion. Pt tolerated well. Will continue to monitor.  Arletta Bale, RN

## 2019-05-19 NOTE — Progress Notes (Signed)
PROGRESS NOTE    Angelica Dunn  [id number] DOB: 04/08/1970 DOA: 05/18/2019 PCP: Renaldo Reel, PA   Brief Narrative:  HPI per Dr. Karmen Bongo on 05/18/19 Angelica Dunn is a 49 y.o. 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  She underwent R axillary lymph node dissection on 03/31/19 with port placement.  Post-operatively, she has had significantly elevated LFTs and also had an incidental R-sided pleural effusion and DVT, on Eliquis.  She was hospitalized at [facility] from 3/11-19 with plan for outpatient oncology f/u in 3-4 days and BP reassessment.  She returned to [facility] yesterday (3/22) with jaundice, increased fatigue, R breast swelling, and persistent upper and lower extremity edema.  She was found to have tachycardia to the 120s with WBC 48.5, increased from 34.  LFTs were elevated (Bili 8, INR 8.3 and previously 1.3).  CT C/A/P with worsening cancer, pleural effusion, ascites, no apparent source of infection.  Her mother reports that she has been primarily failing to thrive with decreased PO intake, increased somnolence, jaundice.  The patient complains of RUE pain and swelling.  She has intermittent confusion.  ED Course:  Given IVF, Cefepime/Vanc.  COVID negative.  Accepted in transfer to SDU for worsening liver failure.  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 Burr Medico discussed the potential benefit and side effect from immunotherapy with the patient's mother and the patient and the patient declined any cancer therapy and patient's mother is very reluctant to take any cancer treatment decisions and feels that the patient is too weak to take any type of treatment and feels that she would benefit from comfort care and possible residential hospice after discharge; palliative is involved and patient mother is to think about the decision of immunotherapy given the patient has some encephalopathy and confusion -Dr. Bobby Rumpf the patient's primary oncologist feels that the patient should start immunotherapy soon as possible -Palliative care has been consulted for goals of care discussion -She was accepted in transfer for acute liver failure but this is not her greatest complaint; she complains of significant RUE pain and edema again -She has R chest wall tumor invasion with lymph node invasion and her RUE edema appears to be due to lymphedema -She also had a recent RUE DVT which could be contributing - but she has been on Eliquis since d/c and was started on Heparin drip once her INR was corrected at [facility] and so this would not be expected to worsen if related to the DVT -She also hds worsening LFTs with markedly elevated INR (corrected) and bilirubin steadily worsening (2.2 on 3/16, 2.4 on 3/17, 5.3 on 3/18, 5.6 on 3/19, 7.8 on 3/22...) -Unfortunately, this appears to be a terminal condition at this time and hospice seems the most likely outcome for this young woman; palliative care consultation has been requested and still pending -Her  mother and the patient are in agreement with DNR at this time -Will give IVF, allow her to eat if she is able, and call GI and oncology to see -Gastroenterology Dr. Henrene Pastor has been evaluating her significant GI work-up earlier this month as below and Dr. Henrene Pastor feels  that this is most consistent with intrahepatic cholestasis and is not convinced that the patient is in true liver failure and thinks this is drug-related -Oncology recommends considering a liver biopsy to rule out diffuse infiltrative liver metastasis from her melanoma though this is uncommon in patients mother refuses a biopsy at this time and also refused the possibility of transferring to [facility] -Elevated lactate and marked leukocytosis is not thought to be related to sepsis but will continue Cefepime for possible RLL infiltrate - although this much more likely related to a malignant effusion; she did receive 7 days of IV Cefepime during her last hospitalization as well will continue for now   DVT, R axillary vein -Hold Eliquis -Will treat with Heparin for now -On MSIR for pain control but will change to IV morphine and would not hesitate to escalate the dose  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 Henrene Pastor saw the patient -Will treat with Lactulose for possible hepatic encephalopathy  -INR was 8.3 at [facility] and was corrected with vitamin K; recheck was 2.5 -Continuing vitamin K -Gastroenterology recommending continuing to correct her coagulopathy with IV vitamin K for several days and trend her INR; she will receive IV vitamin K 5 mg daily for 5 days -she is hypocalcemic -Oncology recommended liver biopsy to see if this melanoma has metastasized to the liver however likely would not change management and only would be done if her INR can normalize -Continue lactulose  Hypercalcemia -Normal calcium but corrected with albumin is high -Will hydrate and recheck in AM -She did receive Zometa during prior hospitalization -Oncology recommending 1 dose of pamidronate given her low GFR and IV fluids -Continue with LR at 100 mL's per hour  Constipation -Continue bowel regimen in addition  to lactulose  Significant metabolic acidosis -Patient CO2 is 12, chloride level is 116, anion gap is 13 -Continue with IV fluid hydration with LR at 100 mL's per hour -Continue to monitor and trend -Repeat CBC in a.m.  Leukocytosis/SIRS -Likely in the setting of malignancy -Patient's WBC is 60.9 and she is continue to remain tachycardic -Continue monitor and trend and will continue empiric IV antibiotics with cefepime  -repeat in trend CBC in the a.m. -Follow-up on cultures  Thrombocytopenia -Patient platelet count is now 124,000 We will continue to monitor for signs and symptoms of bleeding; currently no overt bleeding noted -Repeat CBC in a.m.  Normocytic anemia -Patient's hemoglobin/hematocrit is now 10.6/32.7 and expected to drop further in the setting of IV fluid hydration -Continue to monitor for signs and symptoms of bleeding; currently no overt bleeding noted For repeat CBC in a.m.   DVT prophylaxis: Anticoagulated with a heparin drip Code Status: DO NOT RESUSCITATE  Family Communication: Discussed with Mother at bedside  Disposition Plan: (specify when and where you expect patient to be discharged). Include barriers to DC in this tab.   Consultants:   Medical Oncology  Gastroenterology  Palliative Care Medicine    Procedures: None  Antimicrobials:  Anti-infectives (From admission, onward)   Start     Dose/Rate Route Frequency Ordered Stop   05/18/19 2030  ceFEPIme (MAXIPIME) 2  g in sodium chloride 0.9 % 100 mL IVPB     2 g 200 mL/hr over 30 Minutes Intravenous Every 12 hours 05/18/19 1833       Subjective: Seen and examined at bedside and she was little encephalopathic and resting.  Her mentation has been fluctuating and mother states that she has been depressed.  No nausea or vomiting.  Still complaining of significant right upper forearm pain.  No other concerns or complaints at this time.  Objective: Vitals:   05/19/19 0754 05/19/19 1120 05/19/19  1603 05/19/19 1947  BP: 105/79 (!) 95/59 117/66 108/65  Pulse: (!) 121 (!) 120 (!) 126 (!) 127  Resp: 18 18 20  (!) 22  Temp: 97.7 F (36.5 C) 97.8 F (36.6 C) 97.7 F (36.5 C) (!) 97.5 F (36.4 C)  TempSrc: Oral Oral Oral Oral  SpO2: 100% 100% 100% 100%  Weight:      Height:        Intake/Output Summary (Last 24 hours) at 05/19/2019 2150 Last data filed at 05/19/2019 1817 Gross per 24 hour  Intake 2405.35 ml  Output 1700 ml  Net 705.35 ml   Filed Weights   05/18/19 1800  Weight: 66.2 kg   Examination: Physical Exam:  Constitutional: Chronically ill-appearing Caucasian female who appears in no acute distress but is appearing uncomfortable and very fatigued Eyes: Patient is icteric sclera and lids are normal ENMT: External Ears, Nose appear normal. Grossly normal hearing. Neck: Appears normal, supple, no cervical masses, normal ROM, no appreciable thyromegaly; no JVD Respiratory: Diminished to auscultation bilaterally, no wheezing, rales, rhonchi or crackles.  Mildly tachypneic Cardiovascular: Tachycardic rate but regular rhythm, no murmurs / rubs / gallops. Abdomen: Soft, non-tender, non-distended. Bowel sounds positive.  GU: Deferred. Musculoskeletal: No clubbing / cyanosis of digits/nails. No joint deformity upper and lower extremities.  Skin: Right axilla is swollen has induration from her melanoma. No induration; Warm and dry.  Neurologic: CN 2-12 grossly intact with no focal deficits.  Romberg sign cerebellar reflexes not assessed.  Psychiatric: Impaired judgment and insight. Alert and oriented x 2. Depressed appear mood and appropriate affect.   Data Reviewed: I have personally reviewed following labs and imaging studies  CBC: Recent Labs  Lab 05/13/19 0145 05/14/19 0916 05/18/19 1840 05/19/19 0601  WBC 39.1* 44.2* 61.8* 60.9*  NEUTROABS 29.8*  --  53.1*  --   HGB 11.5* 12.1 11.9* 10.6*  HCT 35.1* 37.4 36.5 32.7*  MCV 89.1 90.6 91.5 93.4  PLT 203 222 177  [id number]*   Basic Metabolic Panel: Recent Labs  Lab 05/13/19 0145 05/14/19 0916 05/18/19 1840 05/19/19 0537  NA 139 140 139 141  K 3.6 4.0 2.6* 4.4  CL 111 112* 114* 116*  CO2 20* 18* 14* 12*  GLUCOSE 152* 99 105* 78  BUN 8 9 19 17   CREATININE 1.25* 1.28* 1.59* 1.65*  CALCIUM 8.6* 8.9 10.6* 10.6*  MG 2.4  --   --   --   PHOS 2.6  --   --   --    GFR: Estimated Creatinine Clearance: 38.1 mL/min (A) (by C-G formula based on SCr of 1.65 mg/dL (H)). Liver Function Tests: Recent Labs  Lab 05/13/19 0145 05/14/19 0916 05/18/19 1840 05/19/19 0537  AST 91* 64* 52* 72*  ALT 176* 119* 40 39  ALKPHOS 208* 218* 291* 260*  BILITOT 2.9* 5.2* 11.7* 11.8*  PROT 5.3* 5.6* 6.2* 5.7*  ALBUMIN 1.6* 1.6* 1.5* 1.4*   No results for input(s): LIPASE, AMYLASE in  the last 168 hours. Recent Labs  Lab 05/18/19 1827  AMMONIA 62*   Coagulation Profile: Recent Labs  Lab 05/18/19 1840 05/19/19 0601  INR 2.7* 3.1*   Cardiac Enzymes: No results for input(s): CKTOTAL, CKMB, CKMBINDEX, TROPONINI in the last 168 hours. BNP (last 3 results) No results for input(s): PROBNP in the last 8760 hours. HbA1C: No results for input(s): HGBA1C in the last 72 hours. CBG: No results for input(s): GLUCAP in the last 168 hours. Lipid Profile: No results for input(s): CHOL, HDL, LDLCALC, TRIG, CHOLHDL, LDLDIRECT in the last 72 hours. Thyroid Function Tests: No results for input(s): TSH, T4TOTAL, FREET4, T3FREE, THYROIDAB in the last 72 hours. Anemia Panel: No results for input(s): VITAMINB12, FOLATE, FERRITIN, TIBC, IRON, RETICCTPCT in the last 72 hours. Sepsis Labs: Recent Labs  Lab 05/18/19 1827 05/18/19 2109  LATICACIDVEN 4.6* 5.1*    No results found for this or any previous visit (from the past 240 hour(s)).   RN Pressure Injury Documentation:     Estimated body mass index is 25.85 kg/m as calculated from the following:   Height as of this encounter: 5\' 3"  (1.6 m).   Weight as of this  encounter: 66.2 kg.  Malnutrition Type:      Malnutrition Characteristics:      Nutrition Interventions:    Radiology Studies: No results found.  Scheduled Meds: . Chlorhexidine Gluconate Cloth  6 each Topical Daily  . senna-docusate  1 tablet Oral BID  . sodium chloride flush  3 mL Intravenous Q12H   Continuous Infusions: . ceFEPime (MAXIPIME) IV 2 g (05/19/19 1957)  . heparin 600 Units/hr (05/19/19 1716)  . lactated ringers 100 mL/hr at 05/19/19 1224  . [START ON 05/20/2019] phytonadione (VITAMIN K) IV      LOS: 1 day   Kerney Elbe, DO [facility] PAGER is on AMION  If 7PM-7AM, please contact night-coverage [email or web address]

## 2019-05-19 NOTE — Consult Note (Addendum)
[facility] Consult: 8:28 AM 05/19/2019  LOS: 1 day    Referring Provider: Dr Alfredia Ferguson  Primary Care Physician:  Renaldo Reel, PA Primary Gastroenterologist:  Dr. Havery Moros Primary oncologist: Dr. Bobby Rumpf at [facility].   Reason for Consultation:  Jaundice   HPI: Angelica Dunn is a 49 y.o. female.  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  02/16/2019 right breast, axillary node biopsy showing sclerosing breast lesion, ductal hyperplasia, nodes positive for melanoma.  03/31/2019 resection melanoma from right arm, 7 of 19 nodes positive for melanoma.  Immunotherapy delayed due to unexplained elevated LFTs.  Outpt GI eval 3/11 and inpt eval 3/12 for elevated LFTs. T bili  2.5, alk phos 176, AST/ALT 219/489.  INR 1.3.  Negative labs included: Acute hepatitis panel, HCV quant, IgG, CMV IgM, Ceruloplasmin, A1AT, smooth muscle AB, mitochondrial Ab Monospot, HSV II IgM, EBV IgM ANA positive w ANA titer 1:80,cytroplasmic pattern.  GI imaging includes 05/06/2019 ultrasound: CBD 10 mm which is new compared with CT 1 week prior.  Mild gallbladder distention, otherwise normal GB.  New right pleural effusion 05/06/2019 MRCP: 8 mm CBD.  GB, pancreas, liver normal.  Small right pleural effusion.  Subcutaneous right chest and abdominal wall edema.  Small right breast fluid collection, likely postop seroma. 05/07/2019 CT angio of chest: Progressive right axillary infiltrating tumor invading chest wall and musculature.  Small right pleural effusion with ATX.  No definitive pulmonary nodules/mets. GI, Dr. Havery Moros and Dr. Silverio Decamp felt LFT elevation was likely drug-induced liver injury.  However culprit med not determined.  Signed off as LFTs were trending down.  No indications for ERCP. During  05/06/2019-05/14/2019 hospitalization she had WBCs to 39 K w/o fever, felt likely secondary to malignancy, no source of infection uncovered.  Recieved 7-day course empiric cefepime.  Doppler studies confirmed RUE DVT, which caused signif  painand requiring narcotic pain control.  Other issues included electrolyte abnormalities with hypokalemia, hypophosphatemia, hypercalcemia, dyspnea, normocytic anemia.  ++++++++++++++++++++++++++++++++++++++++++++++++++++++++  Now transferred from [facility] yesterday with jaundice, fatigue, FTT, anorexia, increased somnolence, intermittent confusion, persistent upper/lower extremity edema, right breast swelling.  Tachy to 120s.  Hypokalemic at 2.8.  proBNP 812.  APAP <10.   WBCs 61.8. Hgb 10.6.  Platelets 124 (new).   T bili 11.8.  Alk phos 291.  AST 72, ALT 39.  Ammonia 62. AKI 17/1.617/1.6 17/1.6 INR 8.3 >> (Vitamin K IV???) >> 2.5 >> 3.1 ([facility] this AM).  PT/PTT 74/61 in setting of Heparin at [facility] Lactate 5.1. CXR: Increased right base infiltrate and effusion. CT chest, ab, pelvis: Increased size in right axillary mass invading chest wall.  Increased right pleural effusion.  New small to moderate abdominopelvic ascites.  GB wall edema, nonspecific.  Stable right breast seroma.  Oncology, Dr Burr Medico, wondering if liver biopsy indicated despite no evidence of liver mets on recent scan.  Liver involvement with melanoma would determine her candidacy for local therapy to axillary mass.   Past Medical History:  Diagnosis Date  . No pertinent past medical history  Past Surgical History:  Procedure Laterality Date  . BLADDER SUSPENSION  10/09/2011   Procedure: TRANSVAGINAL TAPE (TVT) PROCEDURE;  Surgeon: Daria Pastures, MD;  Location: [facility];  Service: Gynecology;  Laterality: N/A;  . BREAST EXCISIONAL BIOPSY Right   . CYSTOCELE REPAIR  10/09/2011   Procedure: ANTERIOR REPAIR (CYSTOCELE);  Surgeon: Daria Pastures, MD;  Location: [facility];  Service:  Gynecology;  Laterality: N/A;  . CYSTOSCOPY  10/09/2011   Procedure: CYSTOSCOPY;  Surgeon: Daria Pastures, MD;  Location: [facility];  Service: Gynecology;  Laterality: N/A;  . VAGINAL HYSTERECTOMY  10/09/2011   Procedure: HYSTERECTOMY VAGINAL;  Surgeon: Daria Pastures, MD;  Location: [facility];  Service: Gynecology;  Laterality: N/A;    Prior to Admission medications   Medication Sig Start Date End Date Taking? Authorizing Provider  acetaminophen (TYLENOL) 325 MG tablet Take 325-650 mg by mouth every 6 (six) hours as needed for mild pain or headache.    [provider]  amitriptyline (ELAVIL) 10 MG tablet Take 10-20 mg by mouth at bedtime as needed for sleep.     [provider]  apixaban (ELIQUIS) 5 MG TABS tablet Take 2 tablets (10 mg total) by mouth 2 (two) times daily for 7 days. 05/14/19 05/21/19  Eugenie Filler, MD  apixaban (ELIQUIS) 5 MG TABS tablet Take 1 tablet (5 mg total) by mouth 2 (two) times daily. 05/20/19   Eugenie Filler, MD  ezetimibe (ZETIA) 10 MG tablet Take 1 tablet (10 mg total) by mouth daily. 05/21/19   Eugenie Filler, MD  feeding supplement, ENSURE ENLIVE, (ENSURE ENLIVE) LIQD Take 237 mLs by mouth 2 (two) times daily between meals. 05/15/19   Eugenie Filler, MD  fexofenadine (ALLEGRA) 180 MG tablet Take 180 mg by mouth daily as needed for rhinitis (or seasonal allergies).    [provider]  magnesium hydroxide (MILK OF MAGNESIA) 400 MG/5ML suspension Take 15-30 mLs by mouth 2 (two) times daily as needed for mild constipation or moderate constipation.     [provider]  morphine (MSIR) 15 MG tablet Take 0.5-1 tablets (7.5-15 mg total) by mouth every 4 (four) hours as needed for severe pain. 05/14/19   Eugenie Filler, MD  Multiple Vitamin (MULTIVITAMIN WITH MINERALS) TABS tablet Take 1 tablet by mouth daily. 05/15/19   Eugenie Filler, MD  ondansetron (ZOFRAN-ODT) 4 MG disintegrating tablet Take 4 mg by mouth every 8  (eight) hours as needed for nausea or vomiting (DISSOLVE ORALLY).    [provider]  polyethylene glycol (MIRALAX / GLYCOLAX) 17 g packet Take 17 g by mouth daily as needed for mild constipation. 05/14/19   Eugenie Filler, MD  senna-docusate (SENOKOT-S) 8.6-50 MG tablet Take 1 tablet by mouth 2 (two) times daily. 05/14/19   Eugenie Filler, MD  traMADol (ULTRAM) 50 MG tablet Take 50 mg by mouth every 6 (six) hours as needed (for pain).     [provider]  triamcinolone (NASACORT ALLERGY 24HR) 55 MCG/ACT AERO nasal inhaler Place 2 sprays into the nose daily as needed (for seasonal allergies).    [provider]    Scheduled Meds: . Chlorhexidine Gluconate Cloth  6 each Topical Daily  . senna-docusate  1 tablet Oral BID  . sodium chloride flush  3 mL Intravenous Q12H   Infusions: . ceFEPime (MAXIPIME) IV 2 g (05/19/19 0800)  . heparin 800 Units/hr (05/19/19 0715)  . lactated ringers 100 mL/hr at 05/18/19 2013  PRN Meds: amitriptyline, lactulose, magnesium hydroxide, morphine injection, ondansetron **OR** ondansetron (ZOFRAN) IV, polyethylene glycol   Allergies as of 05/17/2019 - Review Complete 05/07/2019  Allergen Reaction Noted  . Gabapentin Other (See Comments) 05/06/2019    Family History  Problem Relation Age of Onset  . Breast cancer Maternal Aunt   . Breast cancer Cousin     Social History   Socioeconomic History  . Marital status: Divorced    Spouse name: Not on file  . Number of children: Not on file  . Years of education: Not on file  . Highest education level: Not on file  Occupational History  . Not on file  Tobacco Use  . Smoking status: Former Research scientist (life sciences)  . Smokeless tobacco: Never Used  Substance and Sexual Activity  . Alcohol use: Not Currently  . Drug use: No  . Sexual activity: Not on file  Other Topics Concern  . Not on file  Social History Narrative  . Not on file   Social Determinants of Health   Financial  Resource Strain:   . Difficulty of Paying Living Expenses:   Food Insecurity:   . Worried About Charity fundraiser in the Last Year:   . Arboriculturist in the Last Year:   Transportation Needs:   . Film/video editor (Medical):   Marland Kitchen Lack of Transportation (Non-Medical):   Physical Activity:   . Days of Exercise per Week:   . Minutes of Exercise per Session:   Stress:   . Feeling of Stress :   Social Connections:   . Frequency of Communication with Friends and Family:   . Frequency of Social Gatherings with Friends and Family:   . Attends Religious Services:   . Active Member of Clubs or Organizations:   . Attends Archivist Meetings:   Marland Kitchen Marital Status:   Intimate Partner Violence:   . Fear of Current or Ex-Partner:   . Emotionally Abused:   Marland Kitchen Physically Abused:   . Sexually Abused:     REVIEW OF SYSTEMS: Constitutional:  Weakness, almost no yesterday. ENT:  No nose bleeds Pulm: No cough or labored breathing. CV:   no CP.  Tachycardia.  Persistent bilateral lower extremity edema.  Left >> right upper extremity edema GU: Urine dark like tea.  Good urine output per her mom.  No hematuria, no frequency GI: No nausea, vomiting.  Only wants to drink liquids only eats Jell-O, popsicles, fruit. Heme: No unusual bleeding or bruising. Transfusions: None. Neuro: No seizures, no syncope.  Unsteady on her feet.  No headaches, no peripheral tingling or numbness Derm:  No itching, no rash or sores.  Endocrine:  No sweats or chills.  Mother reports patient is thirsty. Immunization: Not inquired. Travel:  None beyond local counties in last few months.    PHYSICAL EXAM: Vital signs in last 24 hours: Vitals:   05/19/19 0408 05/19/19 0754  BP: 101/64 105/79  Pulse: (!) 123 (!) 121  Resp: 17 18  Temp: (!) 96.7 F (35.9 C) 97.7 F (36.5 C)  SpO2: 100% 100%   Wt Readings from Last 3 Encounters:  05/18/19 66.2 kg  05/14/19 66.2 kg  05/06/19 57.2 kg    General:  Ill, jaundiced appearing, exhausted, mostly sleeping, audible. Head: No signs of head trauma or asymmetry. Eyes: Scleral icterus.  No conjunctival pallor. Ears: Not hard of hearing. Nose: No discharge or congestion. Mouth: Mucosa moist, pink, clear.  Tongue midline. Neck: No JVD, masses, thyromegaly.  Lungs: Clear bilaterally.  No labored breathing.  No cough. Heart: Tachycardic, regular.  Rate in the 120s.  No MRG. Abdomen: Soft.  Bowel sounds hypoactive.  No HSM.  Slight tenderness in the epigastric/mid upper abdomen.  No hernias, no bruits. Rectal: Deferred. Musc/Skeltl: No joint redness or gross deformity. Extremities: Painful, tight edema in the right shoulder and upper extremity.  Lesser, painless edema in LUE.  Painless, pitting edema in both feet/ankles. Neurologic: No tremor, no atelectasis.  Follows commands but drifts to "sleep" quickly.  Oriented to self, place, time. Skin: Slight jaundice.  No telangiectasia, open sores, rash. Tattoos: None.   Psych:  Flat affect.    Intake/Output from previous day: 03/23 0701 - 03/24 0700 In: 1325 [P.O.:222; I.V.:745.1; IV Piggyback:357.9] Out: -  Intake/Output this shift: No intake/output data recorded.  LAB RESULTS: Recent Labs    05/18/19 1840 05/19/19 0601  WBC 61.8* PENDING  HGB 11.9* 10.6*  HCT 36.5 32.7*  PLT 177 124*   BMET Lab Results  Component Value Date   NA 141 05/19/2019   NA 139 05/18/2019   NA 140 05/14/2019   K 4.4 05/19/2019   K 2.6 (LL) 05/18/2019   K 4.0 05/14/2019   CL 116 (H) 05/19/2019   CL 114 (H) 05/18/2019   CL 112 (H) 05/14/2019   CO2 12 (L) 05/19/2019   CO2 14 (L) 05/18/2019   CO2 18 (L) 05/14/2019   GLUCOSE 78 05/19/2019   GLUCOSE 105 (H) 05/18/2019   GLUCOSE 99 05/14/2019   BUN 17 05/19/2019   BUN 19 05/18/2019   BUN 9 05/14/2019   CREATININE 1.65 (H) 05/19/2019   CREATININE 1.59 (H) 05/18/2019   CREATININE 1.28 (H) 05/14/2019   CALCIUM 10.6 (H) 05/19/2019   CALCIUM 10.6 (H)  05/18/2019   CALCIUM 8.9 05/14/2019   LFT Recent Labs    05/18/19 1840 05/19/19 0537  PROT 6.2* 5.7*  ALBUMIN 1.5* 1.4*  AST 52* 72*  ALT 40 39  ALKPHOS 291* 260*  BILITOT 11.7* 11.8*   PT/INR Lab Results  Component Value Date   INR 3.1 (H) 05/19/2019   INR 2.7 (H) 05/18/2019   INR 1.3 (H) 05/06/2019   Hepatitis Panel No results for input(s): HEPBSAG, HCVAB, HEPAIGM, HEPBIGM in the last 72 hours.   RADIOLOGY STUDIES: No results found.    IMPRESSION:   *   Jaundice, (acute liver failure?).  Attributed 2 weeks ago to drug-induced liver injury (DILI) though actual med never determined. T bili and alkaline phosphatase are rising while transaminases have improved. 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  *   RUE DVT, initiated Eliquis 2 weeks ago.  Now on Heparin gtt.    *    Leukocytosis, worsened. No fever.  Oncology attributes to malignancy. But pt also receiving abx for PNA.    *     Coagulopathy, can be seen with Eliquis but concern for hepatic synthetic dysfunction. Improved (after IV vitamin K at [facility], ?) but now rising.    *     New thrombocytopenia.  *     FTT, somnolence, periodic confusion with mild ammonia elevation at 62. Staging MRI brain 05/11/2019 showed no intracranial metastatic disease   PLAN:      *   ? Benefit of liver biopsy which carries risk in setting of coagulopathy.    *   ? paracentesis  and studies for SBP, cytology?  ? Thoracentesis to r/o malignant effusion?   *   Start  vitamin K.     Azucena Freed  05/19/2019, 8:28 AM Phone [phone]  GI ATTENDING  History, laboratories, x-rays, outpatient evaluation, past inpatient evaluation, and pathology reviewed.  Patient seen and examined.  Agree with comprehensive consultation note as outlined above.  IMPRESSION: 1.   Significantly abnormal liver tests in a patient with progressive recurrent metastatic melanoma.  Previously she underwent initial GI evaluation for this problem May 06, 2019.  Negative extensive work-up for multiple entities with laboratories, serologies, and imaging.  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  No specific agent ascertained, but suspected exposure with her recent surgery few weeks earlier.  Was trending toward improvement, thus no biopsy.  Currently with minimal transaminase elevation but significant alkaline phosphatase and bilirubin elevation.  This is most consistent with intrahepatic cholestasis.  I am not convinced, at this point, that she is truly in "liver failure".  When related to drug exposure, intrahepatic cholestasis may take months to resolve.  An additional possibility, however, would be diffuse infiltrative disease from her tumor which has eluded multiple imaging studies, including MRI.  I am not certain if this is characteristic of a malignancy like melanoma, as it is with, say, lymphoma.  Her elevated prothrombin time is likely related to her exposure to Eliquis.  She did get significant correction (though not normal) with vitamin K.  Eliquis is washing out as she has renal insufficiency.  I would recommend additional IV vitamin K for several days and trend her INR.  As for her decreased sensorium, this may be multifactorial.  Mildly elevated venous ammonia is neither sensitive nor specific.  When correcting for hypoalbuminemia she is significantly hypercalcemic.  This may be contributing to altered mental status as well as renal insufficiency.  This needs to be addressed by primary service and/or oncology.  The next issue relates to the need for liver biopsy.  I would advocate only if this would change management and be reasonably safe (would like to see normal INR).  If oncology feels that this would help her management, then I would leave that decision  to them. 2.  Hypercalcemia.  Discussed above 3.  Marked leukocytosis.  I cannot relate directly liver disease.  Oncology feels may be related to malignancy state 4.  Aggressive progressive recurrent metastatic melanoma.  Ongoing need to be treated.  Options, as I understand them, include immunotherapy and/or radiation.  We will follow.  Discussed with Dr. Burr Medico of oncology this morning.  Docia Chuck. Geri Seminole., M.D. [facility] Division of Gastroenterology

## 2019-05-19 NOTE — Progress Notes (Signed)
Lindee Leason   DOB:December 07, 1970   MI#:[id number]   MGN#:[id number]  Oncology follow up   Subjective: Pt was discharged home from [facility] 5 days ago, and presented to local hospital for worsening fatigue, intermittent confusion, failure to thrive at home.  Due to the severe jaundice and the liver failure, he was transferred to [facility] yesterday.  I spoke with patient and his mother at the bedside.  Patient is drowsy, easily arousable, able to answer most questions.  Objective:  Vitals:   05/19/19 1120 05/19/19 1603  BP: (!) 95/59 117/66  Pulse: (!) 120 (!) 126  Resp: 18 20  Temp: 97.8 F (36.6 C) 97.7 F (36.5 C)  SpO2: 100% 100%    Body mass index is 25.85 kg/m.  Intake/Output Summary (Last 24 hours) at 05/19/2019 1635 Last data filed at 05/19/2019 1500 Gross per 24 hour  Intake 2627.35 ml  Output --  Net 2627.35 ml     (+) jaundice   Abdomen soft, non tender, no organmegaly   (+) Right upper extremity edema, including hand, no edema in left arm or LEs  Large palpable mass in right axilla and UOQ of right breast with lymphedema   CBG (last 3)  No results for input(s): GLUCAP in the last 72 hours.   Labs:  Urine Studies No results for input(s): UHGB, CRYS in the last 72 hours.  Invalid input(s): UACOL, UAPR, USPG, UPH, UTP, UGL, [place], UBIL, UNIT, UROB, [place], UEPI, UWBC, [place], [place], [place], [place], [place]  Basic Metabolic Panel: Recent Labs  Lab 05/13/19 0145 05/13/19 0145 05/14/19 0916 05/14/19 0916 05/18/19 1840 05/19/19 0537  NA 139  --  140  --  139 141  K 3.6   < > 4.0   < > 2.6* 4.4  CL 111  --  112*  --  114* 116*  CO2 20*  --  18*  --  14* 12*  GLUCOSE 152*  --  99  --  105* 78  BUN 8  --  9  --  19 17  CREATININE 1.25*  --  1.28*  --  1.59* 1.65*  CALCIUM 8.6*  --  8.9  --  10.6* 10.6*  MG 2.4  --   --   --   --   --   PHOS 2.6  --   --   --   --   --    < > = values in this interval not displayed.   GFR Estimated Creatinine Clearance: 38.1  mL/min (A) (by C-G formula based on SCr of 1.65 mg/dL (H)). Liver Function Tests: Recent Labs  Lab 05/13/19 0145 05/14/19 0916 05/18/19 1840 05/19/19 0537  AST 91* 64* 52* 72*  ALT 176* 119* 40 39  ALKPHOS 208* 218* 291* 260*  BILITOT 2.9* 5.2* 11.7* 11.8*  PROT 5.3* 5.6* 6.2* 5.7*  ALBUMIN 1.6* 1.6* 1.5* 1.4*   No results for input(s): LIPASE, AMYLASE in the last 168 hours. Recent Labs  Lab 05/18/19 1827  AMMONIA 62*   Coagulation profile Recent Labs  Lab 05/18/19 1840 05/19/19 0601  INR 2.7* 3.1*    CBC: Recent Labs  Lab 05/13/19 0145 05/14/19 0916 05/18/19 1840 05/19/19 0601  WBC 39.1* 44.2* 61.8* 60.9*  NEUTROABS 29.8*  --  53.1*  --   HGB 11.5* 12.1 11.9* 10.6*  HCT 35.1* 37.4 36.5 32.7*  MCV 89.1 90.6 91.5 93.4  PLT 203 222 177 124*   Cardiac Enzymes: No results for input(s): CKTOTAL, CKMB, CKMBINDEX, TROPONINI  in the last 168 hours. BNP: Invalid input(s): POCBNP CBG: No results for input(s): GLUCAP in the last 168 hours. D-Dimer No results for input(s): DDIMER in the last 72 hours. Hgb A1c No results for input(s): HGBA1C in the last 72 hours. Lipid Profile No results for input(s): CHOL, HDL, LDLCALC, TRIG, CHOLHDL, LDLDIRECT in the last 72 hours. Thyroid function studies No results for input(s): TSH, T4TOTAL, T3FREE, THYROIDAB in the last 72 hours.  Invalid input(s): FREET3 Anemia work up No results for input(s): VITAMINB12, FOLATE, FERRITIN, TIBC, IRON, RETICCTPCT in the last 72 hours. Microbiology No results found for this or any previous visit (from the past 240 hour(s)).    Studies:  No results found.  Assessment: 49 y.o.   1.  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  Severe hyperbilirubinemia, mild transaminitis 2.  Recent RUE DVT, on heparin now  3.  Leukocytosis 4.  Elevated LFTs and total bilirubin 5.  Hypercalcemia, secondary to melanoma  6.  failure to thrive 7.  Severe protein and  calorie malnutrition 8.  Metabolic encephalopathy  Plan:  -I have reviewed her recent scan and lab results, she unfortunately developed severe hyperbilirubinemia in the past 5 days, although transaminitis is actually very mild now.  I spoke with GI Dr. Henrene Pastor this morning, he thinks her intrahepatic cholestasis is related to medicines.  -I would consider liver biopsy to rule out diffuse infiltrative liver metastasis from her melanoma, although this is very uncommon given negative CT and MRI  -I recommend pt to start Keytruda in the hospital, possible tomorrow, and consider palliative radiation to right axilla and chest for symptom relief, and reduce her tumor burden.  However given her the unresectable recurrent melanoma, her disease is unlikely curable.  Immunotherapy usually does not work rapidly.  Her tumor was tested for BRAF, which came back negative, so she is not a candidate for targeted therapy.  -I explained the benefit and potential side effect from immunotherapy, especially autoimmune related hepatitis, pneumonitis, colitis, and other endocrine disorders.  I explained this to patient and her mother that those are rare side effects, but it could be severe if it happens.   Due to severe hyperbilirubinemia, and poor performance status, the risk of side effects is higher than usual.  However given her her young age, untreated melanoma, I strongly encouraged her to consider treatment, which is likely the only chance to improve her overall condition. I believe her condition will continue to deteriorate and she would likely die in the next few weeks to a few months, if we do not treat her melanoma.  -After a lengthy discussion with patient and her mother, patient declined any cancer therapy, but does acknowledge that she is depressed.  Given her metabolic encephalopathy and depression, I asked her mother to make a decision for her.  Her mother is very reluctant to let her take any cancer treatment, and  repeatedly said "she is too weak to take treatment". She would like her to have comfort care, and possible residential hospice after discharge.  -I also offered her the possibility of transferring her to [facility] inpatient service for management of her very challenge and complex medical issues. She declined.  -I encourage pt and her mother to think through the options again, and no rash to make hospice decision today. I will stop by again tomorrow. -I have spoken with her primary oncologist Dr. Bobby Rumpf extensively about above, he also strongly feel pt should  try immunotherapy as soon as possible. -please consult palliative care -her corrected calcium is 12.7, would consider one dose of pamidronate give her low GFR, and IVF.  -I will f/u tomorrow     Truitt Merle, MD 05/19/2019  4:35 PM

## 2019-05-19 NOTE — Progress Notes (Signed)
CRITICAL VALUE ALERT  Critical Value:  WBC 60.9  Date & Time Notied:  05/19/2019 1143  Provider Notified: Alfredia Ferguson  Orders Received/Actions taken: No new orders at this time.

## 2019-05-20 DIAGNOSIS — Z7189 Other specified counseling: Secondary | ICD-10-CM

## 2019-05-20 DIAGNOSIS — Z66 Do not resuscitate: Secondary | ICD-10-CM

## 2019-05-20 DIAGNOSIS — D689 Coagulation defect, unspecified: Secondary | ICD-10-CM

## 2019-05-20 DIAGNOSIS — I82A11 Acute embolism and thrombosis of right axillary vein: Secondary | ICD-10-CM

## 2019-05-20 DIAGNOSIS — Z515 Encounter for palliative care: Secondary | ICD-10-CM

## 2019-05-20 LAB — CBC WITH DIFFERENTIAL/PLATELET
Abs Immature Granulocytes: 0 10*3/uL (ref 0.00–0.07)
Basophils Absolute: 0 10*3/uL (ref 0.0–0.1)
Basophils Relative: 0 %
Eosinophils Absolute: 0 10*3/uL (ref 0.0–0.5)
Eosinophils Relative: 0 %
HCT: 31.5 % — ABNORMAL LOW (ref 36.0–46.0)
Hemoglobin: 9.9 g/dL — ABNORMAL LOW (ref 12.0–15.0)
Lymphocytes Relative: 3 %
Lymphs Abs: 1.8 10*3/uL (ref 0.7–4.0)
MCH: 29.8 pg (ref 26.0–34.0)
MCHC: 31.4 g/dL (ref 30.0–36.0)
MCV: 94.9 fL (ref 80.0–100.0)
Monocytes Absolute: 1.8 10*3/uL — ABNORMAL HIGH (ref 0.1–1.0)
Monocytes Relative: 3 %
Neutro Abs: 56.7 10*3/uL — ABNORMAL HIGH (ref 1.7–7.7)
Neutrophils Relative %: 94 %
Platelets: 130 10*3/uL — ABNORMAL LOW (ref 150–400)
RBC: 3.32 MIL/uL — ABNORMAL LOW (ref 3.87–5.11)
RDW: 20.8 % — ABNORMAL HIGH (ref 11.5–15.5)
WBC: 60.3 10*3/uL (ref 4.0–10.5)
nRBC: 2 % — ABNORMAL HIGH (ref 0.0–0.2)
nRBC: 4 /100 WBC — ABNORMAL HIGH

## 2019-05-20 LAB — COMPREHENSIVE METABOLIC PANEL
ALT: 24 U/L (ref 0–44)
AST: 35 U/L (ref 15–41)
Albumin: 1.1 g/dL — ABNORMAL LOW (ref 3.5–5.0)
Alkaline Phosphatase: 231 U/L — ABNORMAL HIGH (ref 38–126)
Anion gap: 7 (ref 5–15)
BUN: 18 mg/dL (ref 6–20)
CO2: 15 mmol/L — ABNORMAL LOW (ref 22–32)
Calcium: 11.6 mg/dL — ABNORMAL HIGH (ref 8.9–10.3)
Chloride: 124 mmol/L — ABNORMAL HIGH (ref 98–111)
Creatinine, Ser: 1.44 mg/dL — ABNORMAL HIGH (ref 0.44–1.00)
GFR calc Af Amer: 50 mL/min — ABNORMAL LOW (ref >60)
GFR calc non Af Amer: 43 mL/min — ABNORMAL LOW (ref >60)
Glucose, Bld: 91 mg/dL (ref 70–99)
Potassium: 2.2 mmol/L — CL (ref 3.5–5.1)
Sodium: 146 mmol/L — ABNORMAL HIGH (ref 135–145)
Total Bilirubin: 9.9 mg/dL — ABNORMAL HIGH (ref 0.3–1.2)
Total Protein: 4.7 g/dL — ABNORMAL LOW (ref 6.5–8.1)

## 2019-05-20 LAB — BASIC METABOLIC PANEL
Anion gap: 8 (ref 5–15)
BUN: 18 mg/dL (ref 6–20)
CO2: 13 mmol/L — ABNORMAL LOW (ref 22–32)
Calcium: 12 mg/dL — ABNORMAL HIGH (ref 8.9–10.3)
Chloride: 124 mmol/L — ABNORMAL HIGH (ref 98–111)
Creatinine, Ser: 1.45 mg/dL — ABNORMAL HIGH (ref 0.44–1.00)
GFR calc Af Amer: 49 mL/min — ABNORMAL LOW (ref >60)
GFR calc non Af Amer: 42 mL/min — ABNORMAL LOW (ref >60)
Glucose, Bld: 90 mg/dL (ref 70–99)
Potassium: 2.5 mmol/L — CL (ref 3.5–5.1)
Sodium: 145 mmol/L (ref 135–145)

## 2019-05-20 LAB — PROTIME-INR
INR: 2.5 — ABNORMAL HIGH (ref 0.8–1.2)
Prothrombin Time: 26.9 seconds — ABNORMAL HIGH (ref 11.4–15.2)

## 2019-05-20 LAB — HEPARIN LEVEL (UNFRACTIONATED)
Heparin Unfractionated: 0.72 IU/mL — ABNORMAL HIGH (ref 0.30–0.70)
Heparin Unfractionated: 0.66 IU/mL (ref 0.30–0.70)
Heparin Unfractionated: 0.62 IU/mL (ref 0.30–0.70)

## 2019-05-20 LAB — APTT
aPTT: 101 seconds — ABNORMAL HIGH (ref 24–36)
aPTT: 69 seconds — ABNORMAL HIGH (ref 24–36)
aPTT: 92 seconds — ABNORMAL HIGH (ref 24–36)

## 2019-05-20 LAB — PHOSPHORUS: Phosphorus: 4.3 mg/dL (ref 2.5–4.6)

## 2019-05-20 LAB — MAGNESIUM: Magnesium: 2 mg/dL (ref 1.7–2.4)

## 2019-05-20 MED ORDER — SODIUM BICARBONATE 8.4 % IV SOLN
50.0000 meq | Freq: Once | INTRAVENOUS | Status: AC
  Administered 2019-05-20: 20:00:00 50 meq via INTRAVENOUS
  Filled 2019-05-20: qty 50

## 2019-05-20 MED ORDER — PANTOPRAZOLE SODIUM 40 MG PO TBEC
40.0000 mg | DELAYED_RELEASE_TABLET | Freq: Every day | ORAL | Status: DC
  Administered 2019-05-20: 10:00:00 40 mg via ORAL
  Filled 2019-05-20: qty 1

## 2019-05-20 MED ORDER — SODIUM CHLORIDE 0.9% FLUSH
10.0000 mL | INTRAVENOUS | Status: DC | PRN
  Administered 2019-05-21: 10 mL

## 2019-05-20 MED ORDER — POTASSIUM CL IN DEXTROSE 5% 20 MEQ/L IV SOLN
20.0000 meq | INTRAVENOUS | Status: DC
  Administered 2019-05-20 (×2): 20 meq via INTRAVENOUS
  Filled 2019-05-20 (×4): qty 1000

## 2019-05-20 MED ORDER — SODIUM CHLORIDE 0.9% FLUSH
10.0000 mL | Freq: Two times a day (BID) | INTRAVENOUS | Status: DC
  Administered 2019-05-20 (×3): 10 mL

## 2019-05-20 MED ORDER — POTASSIUM CHLORIDE 10 MEQ/100ML IV SOLN
10.0000 meq | INTRAVENOUS | Status: AC
  Administered 2019-05-20 – 2019-05-21 (×6): 10 meq via INTRAVENOUS
  Filled 2019-05-20 (×6): qty 100

## 2019-05-20 MED ORDER — POTASSIUM CHLORIDE 10 MEQ/100ML IV SOLN
INTRAVENOUS | Status: AC
  Administered 2019-05-20: 07:00:00 10 meq via INTRAVENOUS
  Filled 2019-05-20: qty 100

## 2019-05-20 MED ORDER — POTASSIUM CHLORIDE 10 MEQ/100ML IV SOLN
10.0000 meq | INTRAVENOUS | Status: DC
  Administered 2019-05-20: 10 meq via INTRAVENOUS
  Filled 2019-05-20: qty 100

## 2019-05-20 MED ORDER — POTASSIUM CHLORIDE 10 MEQ/100ML IV SOLN
10.0000 meq | INTRAVENOUS | Status: AC
  Administered 2019-05-20 (×6): 10 meq via INTRAVENOUS
  Filled 2019-05-20 (×6): qty 100

## 2019-05-20 NOTE — Progress Notes (Signed)
[facility] to the pt's mother Pati Darrah. She confirmed she is interested in her daughter going to the [facility]. Will follow up tomorrow. The pt's mother was wanting to take the afternoon to discuss and speak with the pt's 49 year old daughter.  Cheir Merrilyn Puma RN [phone]

## 2019-05-20 NOTE — Progress Notes (Signed)
Palliative Medicine RN Note: Rec'd request from PMT NP Osborne Oman to expedite referral to [facility]. Called referral in to liaison Cheri.  Marjie Skiff Tyreke Kaeser, RN, BSN, [facility] Palliative Medicine Team 05/20/2019 2:01 PM Office [phone]

## 2019-05-20 NOTE — Progress Notes (Addendum)
Daily Rounding Note  05/20/2019, 8:40 AM  LOS: 2 days   SUBJECTIVE:   Chief complaint:  Jaundice.   Pt feels weak.  Right arm, shoulder, chest wall pain persists.     IV potassium ordered for K of 2.2, infusion running when brown emesis noted by staff ~630 AM today.  No nausea or vomiting since. Tolerating clears this AM, not interested in more substantial foods.    Large brown stool yesterday.     OBJECTIVE:         Vital signs in last 24 hours:    Temp:  [97.5 F (36.4 C)-99.7 F (37.6 C)] 97.6 F (36.4 C) (03/25 0819) Pulse Rate:  [71-132] 126 (03/25 0819) Resp:  [16-24] 16 (03/25 0819) BP: (95-121)/(59-74) 117/65 (03/25 0819) SpO2:  [97 %-100 %] 100 % (03/25 0819) Last BM Date: 05/18/19 Filed Weights   05/18/19 1800  Weight: 66.2 kg   General: Jaundiced, comfortable, exhausted, sleepy but alert.    Heart: tachy to 120s, regular Chest: clear bil in front, slight increased WOB w speaking Abdomen: soft, normal but hypoactive BS.    Extremities: edema in both feet and LUE Neuro/Psych:  Oriented x 3.  No tremor.  Speech delayed but precise.     Intake/Output from previous day: 03/24 0701 - 03/25 0700 In: 4281.7 [P.O.:720; I.V.:2848.3; IV Piggyback:713.4] Out: 3200 [Urine:3200]  Intake/Output this shift: No intake/output data recorded.  Lab Results: Recent Labs    05/18/19 1840 05/19/19 0601 05/20/19 0459  WBC 61.8* 60.9* 60.3*  HGB 11.9* 10.6* 9.9*  HCT 36.5 32.7* 31.5*  PLT 177 124* 130*   BMET Recent Labs    05/18/19 1840 05/19/19 0537 05/20/19 0459  NA 139 141 146*  K 2.6* 4.4 2.2*  CL 114* 116* 124*  CO2 14* 12* 15*  GLUCOSE 105* 78 91  BUN 19 17 18   CREATININE 1.59* 1.65* 1.44*  CALCIUM 10.6* 10.6* 11.6*   LFT Recent Labs    05/18/19 1840 05/19/19 0537 05/20/19 0459  PROT 6.2* 5.7* 4.7*  ALBUMIN 1.5* 1.4* 1.1*  AST 52* 72* 35  ALT 40 39 24  ALKPHOS 291* 260* 231*  BILITOT  11.7* 11.8* 9.9*   PT/INR Recent Labs    05/19/19 0601 05/20/19 0459  LABPROT 31.5* 26.9*  INR 3.1* 2.5*   Scheduled Meds: . Chlorhexidine Gluconate Cloth  6 each Topical Daily  . senna-docusate  1 tablet Oral BID  . sodium chloride flush  10-40 mL Intracatheter Q12H  . sodium chloride flush  3 mL Intravenous Q12H   Continuous Infusions: . ceFEPime (MAXIPIME) IV Stopped (05/19/19 2032)  . dextrose 5 % with KCl 20 mEq / L    . heparin 600 Units/hr (05/20/19 0700)  . phytonadione (VITAMIN K) IV    . potassium chloride     PRN Meds:.amitriptyline, ibuprofen, lactulose, magnesium hydroxide, morphine injection, ondansetron **OR** ondansetron (ZOFRAN) IV, polyethylene glycol, sodium chloride flush   ASSESMENT:   *   Jaundice.  Felt to be DILI induced, agent not ascertained.  Multi-modal imaging negative for liver biliary system disease.  T bili, AST, alk phos  improving.    *   Malignant melenoma, involving R chest wall. Original resection from R abd wall 09/2017, recurrence on nodal biopsy 01/2019 with surgical resection 03/31/19.  Due to abnml LFTs has not been able to start immunotherapy.  Dz unlikely curable.   At present Oncologist rec is to start [place].  Pt and her mom bending towards palliative care.  Pt declined therapy and her mom feels pt is too weak to undergo.   *   Brown emesis w/o gross blood.  Not sent for gastroccult.  No acid suppression in place  *   RUE DVT, Eliquis started 2 plus weeks ago.  Now on hold, IV Heparin in place.     *    Elevated pt/INR.  Incomplete beneficial response to IV Vit K at [facility] before transfer.  Day 2/5 IV Vit K here w INR 3.1 >> 2.5.    *    Thrombocytopenia. Platelets 124 >> 130 over 24 hours.     *   Leukocytosis.  Persists with WBCs steady ~ 60K.    *   Normocytic anemia.  Hgb 12.1 >> 9.9 over past 7 d.    *   Hypokalemia.  *   Hypercalcemia. Pamidronate given      *    AKI, improved.         PLAN   *    Supportive care as doing.    *  Adding Protonix po.  Unless ongoing/recurrent vomiting does not need to be long term.     Azucena Freed  05/20/2019, 8:40 AM Phone [phone]  GI ATTENDING  Interval history data reviewed.  Agree with interval progress note as outlined above.  Please see my impression and recommendations from yesterday.  Slight improvement of INR with vitamin K.  Liver tests somewhat better.  Multiple electrolyte abnormalities and ongoing hypercalcemia being addressed by primary service.  At this point, from GI perspective, treatment is purely supportive.  Plans for cancer treatment per oncology and the patient.  Unfortunate and difficult case.  We are available for questions.  Thank you.  Docia Chuck. Geri Seminole., M.D.  [facility] Division of Gastroenterology

## 2019-05-20 NOTE — Progress Notes (Signed)
Called back to pt room for midline not flushing. Assessed again, midline occluded. Pulled back 1cm, labs drawn. Line flushed.

## 2019-05-20 NOTE — Progress Notes (Signed)
Pt axillary temp 99.7. Unable to obtain oral temp due to pt's mouth breathing and PR temp refused. Pt feels febrile to this RN with very warm skin and endorses feeling "cold" further evidenced by being covered in blankets. Provider on-call, M. Sharlet Salina paged, and order received for PRN ibuprofen. Provider also verbally affirmed LR IVF could be held while 4 hour infusion of incompatible Aredia is infused. Will continue to monitor.

## 2019-05-20 NOTE — Consult Note (Signed)
Consultation Note Date: 05/20/2019   Patient Name: Angelica Dunn  DOB: [birth year]  MRN: [id number]  Age / Sex: 49 y.o., female   PCP: Renaldo Reel, PA Referring Physician: Kerney Elbe, DO   REASON FOR CONSULTATION:Establishing goals of care  Palliative Care consult requested for goals of care discussion in this 49 y.o. 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 She was transferred to [facility] after presenting to [facility] ED with complaints of increased fatigue, right breast swelling, generalized edema, and jaundice. During work-up she was found to be tachycardic, elevated LFTs, WBC 48.5. Chest, abdomen, and pelvis CT showed worsening cancer, pleural effusion, and ascites. increased size of R axillary mass with R chest wall invasion. Since admission she has been evaluated by Oncology with recommendations to consider immunotherapy Beryle Flock) immediately. Patient also given option to transfer to [facility] inpatient service for further management, however patient and mother declined.   Clinical Assessment and Goals of Care: I have reviewed medical records including lab results, imaging, Epic notes, and MAR, received report from the bedside RN, and assessed the patient. I met at the bedside with patient and her mother, Angelica Dunn to discuss diagnosis prognosis, [place], EOL wishes, disposition and options.   Patient is somewhat drowsy with noticeable confusion. She is able to very name, dob, correctly. She complains or right arm pain which she recently received PRN medication for. When I attempt to engage her somewhat in discussion she states "you can speak to my mom she knows what I want and can speak for me!"   I introduced Palliative Medicine as specialized medical care for people living with serious illness. It  focuses on providing relief from the symptoms and stress of a serious illness. The goal is to improve quality of life for both the patient and the family. Mother verbalizes understanding sharing previous experiences with her husband (patient's dad). She reports he passed away at a residential hospice facility after transitioning care to comfort while hospitalized here at [facility].   We discussed a brief life review of the patient, along with her functional and nutritional status. Angelica Dunn reports she has a 55 year old daughter at home. Mother reports she is currently staying at home and her father is there with her for support and care. Patient is divorced but remains good friends with her ex-husband. Mother reports patient worked for the [employer] in [place]. She has 2 dogs and a cat in the home also who she loves dearly. She has 1 brother who is also very supportive.   Angelica Dunn reports prior to admission patient was extremely week which has worsened over the past month but not to this extent. She reports patient has been suffering for the past 3 months and continues to go "downhill rapidly". Sharing patient's brother had to come assist with transferring and assisting with care because she and patient's daughter was unable to perform themselves. Patient has had little to no appetite. Mother endorses weight loss but uncertain of the amount. She reports patient is unable to perform ADLs independently.   We discussed Her current illness and what it means in the larger context of Her on-going co-morbidities. Natural disease trajectory and expectations at EOL were discussed.  Mother expresses she has a clear understanding of patient's current illness and prognosis. She states patient was alert and fully oriented yesterday  when updates where provided by medical team. Patient's mother reports she, patient, and daughter had a long discussion. She is tearful sharing that patient's daughter is very  mature and understands that her mother is dying. Angelica Dunn states "we are all at peace and do not wish to continue watching her suffer the way that she has been." Mother states they are aware there are other options however, they are not going to cure her and will only lead them back to the same place they are now if not worst or sooner. Angelica Dunn reports patient made her decisions yesterday and have decided not to pursue any further treatment options and wishes to proceed with hospice care for EOL. Therapeutic listening and support given.   Patient does open eyes as if listening stating "I am tired of suffering!"   I attempted to elicit values and goals of care important to the patient.    The difference between aggressive medical intervention and comfort care was considered in light of the patient's goals of care. I educated patient/family on what comfort care measures would look like. Mother shares her husband (patient's father) was transitioned to comfort care and later transferred to residential hospice where he later passed away within hr of his arrival to hospice. She is requesting patient continues with current plan of care, no escalation until she is ready to be transferred to the facility. She expresses wishes for the [place] residential hospice. Angelica Dunn reports she would love for patient to be home, however she do not want to put her granddaughter through that emotionally. Support given.   I reviewed current medications and labs etc. With mother. She request to discontinue labs, however she is aware that CBC/hep level may be continued as long as she continues on heparin drip. Discussed discontinuing telemetry. Mother states she wishes to continue but would not want anything done in the setting of changes.   Mother confirms patient's wishes for DNR.   Hospice services outpatient were explained and offered. Mother verbalized her understanding and awareness of hospice's goals and  philosophy of care.   Questions and concerns were addressed. The family was encouraged to call with questions or concerns.  PMT will continue to support holistically.   SOCIAL HISTORY:     reports that she has quit smoking. She has never used smokeless tobacco. She reports previous alcohol use. She reports that she does not use drugs.  CODE STATUS: DNR  ADVANCE DIRECTIVES: Shaune Spittle (mom/decision maker)   SYMPTOM MANAGEMENT: per attending   Palliative Prophylaxis:   Frequent Pain Assessment  PSYCHO-SOCIAL/SPIRITUAL:  Support System: Family  Desire for further Chaplaincy support:NO   Additional Recommendations (Limitations, Scope, Preferences):  Continue current treatment with goal of transferring to hospice for EOL care. No escalation/aggressive care or interventions   PAST MEDICAL HISTORY: Past Medical History:  Diagnosis Date  . No pertinent past medical history     PAST SURGICAL HISTORY:  Past Surgical History:  Procedure Laterality Date  . BLADDER SUSPENSION  10/09/2011   Procedure: TRANSVAGINAL TAPE (TVT) PROCEDURE;  Surgeon: Daria Pastures, MD;  Location: [facility];  Service: Gynecology;  Laterality: N/A;  . BREAST EXCISIONAL BIOPSY Right   . CYSTOCELE REPAIR  10/09/2011   Procedure: ANTERIOR REPAIR (CYSTOCELE);  Surgeon: Daria Pastures, MD;  Location: [place] [facility];  Service: Gynecology;  Laterality: N/A;  . CYSTOSCOPY  10/09/2011   Procedure: CYSTOSCOPY;  Surgeon: Daria Pastures, MD;  Location: [place] [facility];  Service: Gynecology;  Laterality: N/A;  .  VAGINAL HYSTERECTOMY  10/09/2011   Procedure: HYSTERECTOMY VAGINAL;  Surgeon: Daria Pastures, MD;  Location: [facility];  Service: Gynecology;  Laterality: N/A;    ALLERGIES:  is allergic to gabapentin.   MEDICATIONS:  Current Facility-Administered Medications  Medication Dose Route Frequency Provider Last Rate Last Admin  . amitriptyline (ELAVIL) tablet 20 mg  20 mg Oral QHS PRN Karmen Bongo, MD   20 mg at  05/19/19 2359  . ceFEPIme (MAXIPIME) 2 g in sodium chloride 0.9 % 100 mL IVPB  2 g Intravenous Q12H Duanne Limerick, RPH 200 mL/hr at 05/20/19 0908 2 g at 05/20/19 0908  . Chlorhexidine Gluconate Cloth 2 % PADS 6 each  6 each Topical Daily Raiford Noble [place], [place]   6 each at 05/20/19 1123  . dextrose 5 % with KCl 20 mEq / L  infusion  20 mEq Intravenous Continuous Raiford Noble [place], DO 75 mL/hr at 05/20/19 0925 20 mEq at 05/20/19 0925  . heparin ADULT infusion 100 units/mL (25000 units/286m sodium chloride 0.45%)  600 Units/hr Intravenous Continuous WLyndee Leo RPH 6 mL/hr at 05/20/19 0700 600 Units/hr at 05/20/19 0700  . ibuprofen (ADVIL) tablet 600 mg  600 mg Oral Q6H PRN DLang Snow FNP   600 mg at 05/19/19 2359  . lactulose (CHRONULAC) 10 GM/15ML solution 20 g  20 g Oral BID PRN YKarmen Bongo MD      . magnesium hydroxide (MILK OF MAGNESIA) suspension 15-30 mL  15-30 mL Oral BID PRN YKarmen Bongo MD      . morphine 4 MG/ML injection 4 mg  4 mg Intravenous Q1H PRN YKarmen Bongo MD   4 mg at 05/20/19 1249  . ondansetron (ZOFRAN) tablet 4 mg  4 mg Oral Q6H PRN YKarmen Bongo MD       Or  . ondansetron ([facility] injection 4 mg  4 mg Intravenous Q6H PRN YKarmen Bongo MD   4 mg at 05/20/19 0641  . pantoprazole (PROTONIX) EC tablet 40 mg  40 mg Oral Q0600 GVena Rua PA-C   40 mg at 05/20/19 1016  . phytonadione (VITAMIN K) 5 mg in dextrose 5 % 50 mL IVPB  5 mg Intravenous Daily GVena Rua PA-C 50 mL/hr at 05/20/19 1123 5 mg at 05/20/19 1123  . polyethylene glycol (MIRALAX / GLYCOLAX) packet 17 g  17 g Oral Daily PRN YKarmen Bongo MD      . potassium chloride 10 mEq in 100 mL IVPB  10 mEq Intravenous Q1 Hr x 6 SRaiford NobleLAredale DO 100 mL/hr at 05/20/19 1228 10 mEq at 05/20/19 1228  . senna-docusate (Senokot-S) tablet 1 tablet  1 tablet Oral BID YKarmen Bongo MD      . sodium chloride flush (NS) 0.9 % injection 10-40 mL  10-40 mL Intracatheter Q12H FTruitt Merle  MD   10 mL at 05/20/19 0912  . sodium chloride flush (NS) 0.9 % injection 10-40 mL  10-40 mL Intracatheter PRN FTruitt Merle MD      . sodium chloride flush (NS) 0.9 % injection 3 mL  3 mL Intravenous Q12H YKarmen Bongo MD   3 mL at 05/20/19 0926    VITAL SIGNS: BP 111/68 (BP Location: Left Leg)   Pulse (!) 124   Temp 98.2 F (36.8 C) (Oral)   Resp 17   Ht '5\' 3"'$  (1.6 m)   Wt 66.2 kg   LMP 09/29/2011   SpO2 100%   BMI 25.85 kg/m  FAutoliv  05/18/19  1800  Weight: 66.2 kg    Estimated body mass index is 25.85 kg/m as calculated from the following:   Height as of this encounter: _0  (1.6 m).   Weight as of this encounter: 66.2 kg.  LABS: CBC:    Component Value Date/Time   WBC 60.3 (HH) 05/20/2019 0459   HGB 9.9 (L) 05/20/2019 0459   HCT 31.5 (L) 05/20/2019 0459   PLT 130 (L) 05/20/2019 0459   Comprehensive Metabolic Panel:    Component Value Date/Time   NA 145 05/20/2019 1246   K 2.5 (LL) 05/20/2019 1246   CO2 13 (L) 05/20/2019 1246   BUN 18 05/20/2019 1246   CREATININE 1.45 (H) 05/20/2019 1246   ALBUMIN 1.1 (L) 05/20/2019 0459     Review of Systems  Unable to perform ROS: Acuity of condition  Unless otherwise noted, a complete review of systems is negative.  Physical Exam General: NAD, frail chronically-ill appearing, cachectic Cardiovascular: regular rate and rhythm Pulmonary: clear ant fields Extremities: no edema, no joint deformities Neurological: drowsy, easily awaken, intermittent confusion   Prognosis: < 2 weeks in the setting of recurrent metastatic melanoma, patient and mother declining further treatment or interventions, RUE DVT, leukocytosis, elevated LFTs, severe protein calorie malnutrition, failure to thrive, hypokalemia, hypercalcemia, metabolic encephalopathy.   Discharge Planning:  Hospice facility mother requesting [place] location.   Recommendations:  DNR/DNI-as confirmed by patient and mother  Continue current plan of care,  no escalation or aggressive interventions with a goal of comfort.   Mother confirms request to transfer to residential hospice ([place] facility) for comfort/EOL care. (referral placed)  Although goal of care as expressed by patient and mother is comfort, mother does not wish to discontinue current treatments until patient is scheduled to discharge to hospice facility with goal of allowing patient ability to have visitation with family with hopefully no further decline.   PMT will continue to support and follow.    Palliative Performance Scale: PPS 10-20%              Mother expressed understanding and was in agreement with this plan.   Thank you for allowing the Palliative Medicine Team to assist in the care of this patient.  Time In: 1315 Time Out: 1420 Time Total: 65 min.   Visit consisted of counseling and education dealing with the complex and emotionally intense issues of symptom management and palliative care in the setting of serious and potentially life-threatening illness.Greater than 50%  of this time was spent counseling and coordinating care related to the above assessment and plan.  Signed by:  Alda Lea, AGPCNP-BC Palliative Medicine Team  Phone: [phone] Fax: [phone] Pager: [phone] Amion: Bjorn Pippin

## 2019-05-20 NOTE — Progress Notes (Addendum)
Angelica Dunn   DOB:1970-05-27   [id number]   RA:[id number]  Oncology follow up   Subjective: The patient is more drowsy and confused today.  Her mother is at the bedside.  Palliative care NP in the room at the time my visit discussing goals of care.  The patient and her mother have opted against any form of treatment for her cancer and plan to discharge to residential hospice in [facility].  Objective:  Vitals:   05/20/19 0819 05/20/19 1214  BP: 117/65 111/68  Pulse: (!) 126 (!) 124  Resp: 16 17  Temp: 97.6 F (36.4 C) 98.2 F (36.8 C)  SpO2: 100% 100%    Body mass index is 25.85 kg/m.  Intake/Output Summary (Last 24 hours) at 05/20/2019 1325 Last data filed at 05/20/2019 0700 Gross per 24 hour  Intake 4281.67 ml  Output 2400 ml  Net 1881.67 ml     (+) jaundice   Abdomen soft, non tender, no organmegaly   (+) Right upper extremity edema, including hand, no edema in left arm or LEs  Large palpable mass in right axilla and UOQ of right breast with lymphedema   CBG (last 3)  No results for input(s): GLUCAP in the last 72 hours.   Labs:  Urine Studies No results for input(s): UHGB, CRYS in the last 72 hours.  Invalid input(s): UACOL, UAPR, USPG, UPH, UTP, UGL, [place], UBIL, UNIT, Lacinda Axon [place], [place]  Basic Metabolic Panel: Recent Labs  Lab 05/14/19 0916 05/14/19 0916 05/18/19 1840 05/18/19 1840 05/19/19 0537 05/20/19 0459  NA 140  --  139  --  141 146*  K 4.0   < > 2.6*   < > 4.4 2.2*  CL 112*  --  114*  --  116* 124*  CO2 18*  --  14*  --  12* 15*  GLUCOSE 99  --  105*  --  78 91  BUN 9  --  19  --  17 18  CREATININE 1.28*  --  1.59*  --  1.65* 1.44*  CALCIUM 8.9  --  10.6*  --  10.6* 11.6*  MG  --   --   --   --   --  2.0  PHOS  --   --   --   --   --  4.3   < > = values in this interval not displayed.   GFR Estimated Creatinine Clearance: 43.7 mL/min (A) (by C-G formula based on SCr of 1.44 mg/dL  (H)). Liver Function Tests: Recent Labs  Lab 05/14/19 0916 05/18/19 1840 05/19/19 0537 05/20/19 0459  AST 64* 52* 72* 35  ALT 119* 40 39 24  ALKPHOS 218* 291* 260* 231*  BILITOT 5.2* 11.7* 11.8* 9.9*  PROT 5.6* 6.2* 5.7* 4.7*  ALBUMIN 1.6* 1.5* 1.4* 1.1*   No results for input(s): LIPASE, AMYLASE in the last 168 hours. Recent Labs  Lab 05/18/19 1827  AMMONIA 62*   Coagulation profile Recent Labs  Lab 05/18/19 1840 05/19/19 0601 05/20/19 0459  INR 2.7* 3.1* 2.5*    CBC: Recent Labs  Lab 05/14/19 0916 05/18/19 1840 05/19/19 0601 05/20/19 0459  WBC 44.2* 61.8* 60.9* 60.3*  NEUTROABS  --  53.1*  --  56.7*  HGB 12.1 11.9* 10.6* 9.9*  HCT 37.4 36.5 32.7* 31.5*  MCV 90.6 91.5 93.4 94.9  PLT 222 177 124* 130*   Cardiac Enzymes: No results for input(s): CKTOTAL, CKMB, CKMBINDEX, TROPONINI in the last  168 hours. BNP: Invalid input(s): POCBNP CBG: No results for input(s): GLUCAP in the last 168 hours. D-Dimer No results for input(s): DDIMER in the last 72 hours. Hgb A1c No results for input(s): HGBA1C in the last 72 hours. Lipid Profile No results for input(s): CHOL, HDL, LDLCALC, TRIG, CHOLHDL, LDLDIRECT in the last 72 hours. Thyroid function studies No results for input(s): TSH, T4TOTAL, T3FREE, THYROIDAB in the last 72 hours.  Invalid input(s): FREET3 Anemia work up No results for input(s): VITAMINB12, FOLATE, FERRITIN, TIBC, IRON, RETICCTPCT in the last 72 hours. Microbiology No results found for this or any previous visit (from the past 240 hour(s)).    Studies:  No results found.  Assessment: 49 y.o.   1.  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  Severe hyperbilirubinemia, mild transaminitis 2.  Recent RUE DVT, on heparin now  3.  Leukocytosis 4.  Elevated LFTs and total bilirubin 5.  Hypercalcemia, secondary to melanoma  6.  failure to thrive 7.  Severe protein and calorie malnutrition 8.  Metabolic  encephalopathy  Plan:  -I reviewed the discussion yesterday per Dr. Burr Medico regarding Beryle Flock.  The patient's mother indicates that she has not seen any benefit to proceeding with any treatment at this point in time and feels as though treatment may make her feel worse.  She would not like to focus on comfort and would like the patient to be discharged to residential hospice in [facility].  Palliative care is involved and is in the process of transitioning her to comfort measures and arranging for discharge to residential hospice. -The patient has received 1 dose of pamidronate.  Her corrected calcium remains elevated at 13.5.  Labs are being discontinued due to her transition to comfort measures.  We will continue supportive care.  Mikey Bussing, NP 05/20/2019  1:25 PM  Addendum  I have seen the patient, examined her. I agree with the assessment and and plan and have edited the notes.   Pt appears more confused today, disoriented, not able to answer questions clearly. Her mother was not at bedside. Chart reviewed, pt's mother has decided on residential hospice, which she qualifies. Pt does not appear to be in distress. Continue supportive care.   Truitt Merle  05/20/2019

## 2019-05-20 NOTE — Progress Notes (Signed)
@  0619 paged M. Sharlet Salina of [facility] about pt's critical K of 2.2.  Page promptly returned and IV replacement ordered.  Upon entering pt room at approx. 0630 to administer first K run,  pt found with brown watery emesis on abdomen and chin. Pt airway clear and pt able to verbalize that she did vomit. Zofran administered and oral care performed. Suction setup at bedside.  Concurrently, Midline found to be occluded with multiple attempts at flushing unsuccessful. Heparin that was running in midline was rerouted back to [place] and LR held. Will page IV Team to assess midline before removal and to determine if additional access is possible.  Will convey all of the above to Day RN.

## 2019-05-20 NOTE — Progress Notes (Signed)
Midline assessment consult: LUE midline in place. Flushed, brisk blood return noted. OK to use.

## 2019-05-20 NOTE — Progress Notes (Signed)
[place] for Heparin Indication: atrial fibrillation  Allergies  Allergen Reactions  . Gabapentin Other (See Comments)    "Made me feel odd, so I stopped taking it"    Patient Measurements: Height: 5\' 3"  (160 cm) Weight: 145 lb 15.1 oz (66.2 kg) IBW/kg (Calculated) : 52.4 Heparin Dosing Weight: 65.7 kg  Vital Signs: Temp: 98.2 F (36.8 C) (03/25 1214) Temp Source: Oral (03/25 1214) BP: 111/68 (03/25 1214) Pulse Rate: 124 (03/25 1214)  Labs: Recent Labs    05/18/19 1840 05/18/19 1840 05/19/19 0537 05/19/19 0601 05/19/19 0601 05/19/19 1400 05/20/19 0459 05/20/19 0500 05/20/19 0840 05/20/19 1245  HGB 11.9*   < >  --  10.6*  --   --  9.9*  --   --   --   HCT 36.5  --   --  32.7*  --   --  31.5*  --   --   --   PLT 177  --   --  124*  --   --  130*  --   --   --   APTT 54*   < >  --  130*   < > 135* 101*  --  69*  --   LABPROT 29.0*  --   --  31.5*  --   --  26.9*  --   --   --   INR 2.7*  --   --  3.1*  --   --  2.5*  --   --   --   HEPARINUNFRC >2.20*   < >  --   --   --   --   --  0.72* 0.66 0.62  CREATININE 1.59*  --  1.65*  --   --   --  1.44*  --   --   --    < > = values in this interval not displayed.    Estimated Creatinine Clearance: 43.7 mL/min (A) (by C-G formula based on SCr of 1.44 mg/dL (H)).   Medical History: Past Medical History:  Diagnosis Date  . No pertinent past medical history     Medications:  Scheduled:  . Chlorhexidine Gluconate Cloth  6 each Topical Daily  . pantoprazole  40 mg Oral Q0600  . senna-docusate  1 tablet Oral BID  . sodium chloride flush  10-40 mL Intracatheter Q12H  . sodium chloride flush  3 mL Intravenous Q12H    Assessment: Patient is a 5 yof that is being admitted for metastatic axillary melanoma, hx of DVT, and liver failure. The patient was on apixaban at home however was started on a heparin drip at the transferring hospital.   Heparin level therapeutic, aPTT remains  therapeutic, will follow heparin levels alone at this time as now correlated with aPTT.    Goal of Therapy:  Heparin level 0.3-0.7 units/ml aPTT 66-102 seconds Monitor platelets by anticoagulation protocol: Yes   Plan:  Continue heparin gtt at 600 units/hr Daily heparin level, CBC, s/s bleeding  Bertis Ruddy, PharmD Clinical Pharmacist Please check AMION for all [place] numbers 05/20/2019 1:18 PM

## 2019-05-20 NOTE — Progress Notes (Signed)
PROGRESS NOTE    Angelica Dunn  [id number] DOB: 06/26/1970 DOA: 05/18/2019 PCP: Renaldo Reel, PA   Brief Narrative:  HPI per Dr. Karmen Bongo on 05/18/19 Angelica Dunn is a 49 y.o. 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  She underwent R axillary lymph node dissection on 03/31/19 with port placement.  Post-operatively, she has had significantly elevated LFTs and also had an incidental R-sided pleural effusion and DVT, on Eliquis.  She was hospitalized at [facility] from 3/11-19 with plan for outpatient oncology f/u in 3-4 days and BP reassessment.  She returned to [facility] yesterday (3/22) with jaundice, increased fatigue, R breast swelling, and persistent upper and lower extremity edema.  She was found to have tachycardia to the 120s with WBC 48.5, increased from 34.  LFTs were elevated (Bili 8, INR 8.3 and previously 1.3).  CT C/A/P with worsening cancer, pleural effusion, ascites, no apparent source of infection.  Her mother reports that she has been primarily failing to thrive with decreased PO intake, increased somnolence, jaundice.  The patient complains of RUE pain and swelling.  She has intermittent confusion.  ED Course:  Given IVF, Cefepime/Vanc.  COVID negative.  Accepted in transfer to SDU for worsening liver failure.  **Interim History  His mentation has been waxing and waning and continued to worsen today.  She remains sinus tachycardic and she had significant electrolyte abnormalities as she started vomiting today.  Palliative care was consulted as well gastroenterology and medical oncology.  After patient's mother discussion with all the specialists she decided that the best option would be for the patient to go to residential hospice in [facility].  Patient mother does not want the interventions to be stopped completely today and they want patient to be stable enough to be transferred to  [facility] likely tomorrow.  Referral was made to hospice and they do have beds.  Anticipating discharge Residential Hospice in a.m. and will not escalate any care.  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 and discharged on 3/19 with plan for outpatient oncology f/u to start treatment but appears to have rapidly progressive disease; now oncology has reevaluated and recommends the patient to start Keytruda in the hospital but given that the unresectable recurrent melanoma her disease likely incurable; Dr. Burr Medico discussed the potential benefit and side effect from immunotherapy with the patient's mother and the patient and the patient declined any cancer therapy and patient's mother is very reluctant to take any cancer treatment decisions and feels that the patient is too weak to take any type of treatment and feels that she would benefit from comfort care and possible residential hospice after discharge; palliative was consulted and after lengthy discussion with the oncology team and the palliative team the patient's mother like to focus on comfort would like the patient to be discharged to residential hospice unit of [place] as the patient is in the process of transitioning to comfort measures.  Labs have been discontinued due to her transition to comfort measures however she is significantly hypokalemic and remains on a heparin drip.  Because the patient's mother does not want to stop  all interventions currently we will continue these for now -Dr. Bobby Rumpf the patient's primary oncologist feels that the patient should start immunotherapy soon as possible -Palliative care has been consulted for goals of  care discussion -She was accepted in transfer for acute liver failure but this is not her greatest complaint; she complains of significant RUE pain and edema again -She has R chest wall tumor invasion with lymph node invasion and her RUE edema appears to be due to lymphedema -She also had a recent RUE DVT which could be contributing - but she has been on Eliquis since d/c and was started on Heparin drip once her INR was corrected at [facility] and so this would not be expected to worsen if related to the DVT -She also hds worsening LFTs with markedly elevated INR (corrected) and bilirubin steadily worsening (2.2 on 3/16, 2.4 on 3/17, 5.3 on 3/18, 5.6 on 3/19, 7.8 on 3/22...); After vitamin K the liver tests are somewhat better with improvement of her INR slightly  -Unfortunately, this appears to be a terminal condition at this time and hospice seems the most likely outcome for this young woman; palliative care consultation has been requested and occurred today continue treatment with the goal of transferring to hospice for end-of-life care and next 24 to 48 hours; we will not be doing any escalation of care and hospice has been consulted and likely will have a bed tomorrow so likely can be discharged tomorrow as patient is transitioning towards comfort measures; patient's father had also transition to comfort care and patient's mother requests that the patient continue with current plan of care with no escalation until she is ready to be transferred to the facility -Her mother and the patient are in agreement with DNR at this time -Will give IVF, allow her to eat if she is able, and call GI and oncology to see -Gastroenterology Dr. Henrene Pastor has been evaluating her significant GI work-up earlier this month as below and Dr. Henrene Pastor feels that this is most consistent with intrahepatic cholestasis and is not convinced that the patient is in true liver failure and thinks this is drug-related -Oncology recommends considering  a liver biopsy to rule out diffuse infiltrative liver metastasis from her melanoma though this is uncommon in patients mother refuses a biopsy at this time and also refused the possibility of transferring to [facility] -Elevated lactate and marked leukocytosis is not thought to be related to sepsis but will continue Cefepime for possible RLL infiltrate - although this much more likely related to a malignant effusion; she did receive 7 days of IV Cefepime during her last hospitalization as well will continue for now ready to transition to full comfort measures and end-of-life care and hospice   DVT, R axillary vein -Hold Eliquis -Will treat with Heparin for now and continue for now as above -On MSIR for pain control but will change to IV morphine and would not hesitate to escalate the dose  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 Henrene Pastor saw the patient -Will treat with Lactulose for possible hepatic encephalopathy  -INR was 8.3 at [facility] and was corrected with vitamin K; recheck was 2.5 -Continuing vitamin K for now until she is ready to transition to hospice -Gastroenterology recommending continuing to correct her coagulopathy with IV vitamin K for several days and trend her INR; she will receive IV vitamin K 5 mg daily for 5 days -she is  hypocalcemic -Oncology recommended liver biopsy to see if this melanoma has metastasized to the liver however likely would not change management and only would be done if her INR can normalize -Continue lactulose  Hypercalcemia -Initially normal calcium but corrected with albumin is high; now calcium is 12.0 and corrected Ca2+ is 13.9 this AM  -Will hydrate and recheck in AM -She did receive Zometa during prior hospitalization and I did give her pamidronate yesterday -Oncology recommending 1 dose of pamidronate given her low GFR and IV fluids given this was  done yesterday -Continued with LR at 100 mL's per hour but this was changed to D5 W given her hyperchloremia as well as hypernatremia  Constipation -Continue bowel regimen in addition to lactulose  Significant metabolic acidosis -Patient CO2 was 12, chloride level is 116, anion gap is 13; now anion gap is 8, chloride level is 124, and CO2 is 13 -Continue with IV fluid hydration but changed to D5W as above -We will give sodium bicarbonate 1 amp IV  -Continue to monitor and trend -Repeat CBC in a.m.  Leukocytosis/SIRS -Likely in the setting of malignancy -Patient's WBC is now 60.3 and she continues to remain tachycardic -Continue monitor and trend and will continue empiric IV antibiotics with cefepime  -repeat in trend CBC in the a.m. -Follow-up on cultures  Thrombocytopenia -Patient platelet count is now slightly improved and is now 130,000 We will continue to monitor for signs and symptoms of bleeding; currently no overt bleeding noted -Repeat CBC in a.m.  Normocytic Anemia -Patient's hemoglobin/hematocrit is now 9.9/31.5 and expected to drop further in the setting of IV fluid hydration -Continue to monitor for signs and symptoms of bleeding; currently no overt bleeding noted For repeat CBC in a.m.  Hypokalemia -In the setting of Vomiting.  -Patient's potassium was 2.2 this morning next-replete with IV KCl 60 mEq and then replete again with another 60 mEq; interval check was 2.5 -Continue monitor and replete as necessary -We will repeat in the a.m. as patient is being transitioned to hospice   Hypernatremia/Hyperchloremia -Patient's sodium was 146 and chloride level was 124 -IV fluid changes as above -Repeat CMP in a.m.  Goals of Care: Do Not resuscitate present on admission -Patient is extremely high risk for decompensation given that she is actively dying.  Family has elected to transition to comfort care and transition to residential hospice for end-of-life care however  until bed is secure the patient's mother wants [place] to continue current therapies.  Will not be escalating care.  Is a high risk of possible in-hospital death if she does not get transferred to a residential hospice in the next 24 to 48 hours given her extremely poor prognosis and aggressive metastatic melanoma.  Patient has had a coagulopathy secondary to a drug induced liver injury and has had significant tachycardia since being admitted which is not improved.  DVT prophylaxis: Anticoagulated with a heparin drip Code Status: DO NOT RESUSCITATE  Family Communication: Discussed with Mother at bedside  Disposition Plan: (specify when and where you expect patient to be discharged). Include barriers to DC in this tab.   Consultants:   Medical Oncology  Gastroenterology  Palliative Care Medicine    Procedures: None  Antimicrobials:  Anti-infectives (From admission, onward)   Start     Dose/Rate Route Frequency Ordered Stop   05/18/19 2030  ceFEPIme (MAXIPIME) 2 g in sodium chloride 0.9 % 100 mL IVPB     2 g 200 mL/hr over 30 Minutes Intravenous Every 12  hours 05/18/19 1833       Subjective: Seen and examined at bedside she was encephalopathic and remains confused.  She was able to tell me her name but she started perseverating on several topics and is kept stating the same questions over and over again.  Mother states that she has been confused.  Mother has had a lot of time thinking and she wanted to speak with palliative in the goal is now to transition the patient to residential hospice for end-of-life care.  Mother was appreciated of care and had no other concerns.  Objective: Vitals:   05/20/19 1400 05/20/19 1600 05/20/19 1720 05/20/19 1800  BP:   (!) 99/58   Pulse: (!) 120 (!) 117 (!) 118 (!) 120  Resp: 19 15 17 17   Temp:   98.7 F (37.1 C)   TempSrc:   Oral   SpO2: 100% 100% 100% 100%  Weight:      Height:        Intake/Output Summary (Last 24 hours) at 05/20/2019  1906 Last data filed at 05/20/2019 1733 Gross per 24 hour  Intake 4303.15 ml  Output 2100 ml  Net 2203.15 ml   Filed Weights   05/18/19 1800  Weight: 66.2 kg   Examination: Physical Exam:  Constitutional: Chronically ill-appearing Caucasian female who is extremely jaundiced and appears uncomfortable and very fatigued Eyes: Lids and conjunctivae normal, sclerae is icteric  ENMT: External Ears, Nose appear normal. Grossly normal hearing.  Neck: Appears normal, supple, no cervical masses, normal ROM, no appreciable thyromegaly, no JVD Respiratory: Diminished to auscultation bilaterally, no wheezing, rales, rhonchi or crackles. Normal respiratory effort and patient is not tachypenic.  Has some tachypnea but no accessory muscle use Cardiovascular: Tachycardic rate but regular rhythm, no murmurs / rubs / gallops.  Right arm is extremely swollen and she does have some 1+ lower extremity pitting edema Abdomen: Soft, non-tender, non-distended.Bowel sounds positive.  GU: Deferred. Musculoskeletal: No clubbing / cyanosis of digits/nails. No joint deformity upper and lower extremities.  Skin: Right axilla swollen and indurated from her melanoma. Neurologic: Appears somnolent and will arouse but falls back to sleep easily.  Does not really follow commands but does answer questions somewhat. Psychiatric: Impaired judgment and insight. Alert and oriented x 1.  Depressed appearing mood and appropriate affect.   Data Reviewed: I have personally reviewed following labs and imaging studies  CBC: Recent Labs  Lab 05/14/19 0916 05/18/19 1840 05/19/19 0601 05/20/19 0459  WBC 44.2* 61.8* 60.9* 60.3*  NEUTROABS  --  53.1*  --  56.7*  HGB 12.1 11.9* 10.6* 9.9*  HCT 37.4 36.5 32.7* 31.5*  MCV 90.6 91.5 93.4 94.9  PLT 222 177 124* [id number]*   Basic Metabolic Panel: Recent Labs  Lab 05/14/19 0916 05/18/19 1840 05/19/19 0537 05/20/19 0459 05/20/19 1246  NA 140 139 141 146* 145  K 4.0 2.6* 4.4 2.2*  2.5*  CL 112* 114* 116* 124* 124*  CO2 18* 14* 12* 15* 13*  GLUCOSE 99 105* 78 91 90  BUN 9 19 17 18 18   CREATININE 1.28* 1.59* 1.65* 1.44* 1.45*  CALCIUM 8.9 10.6* 10.6* 11.6* 12.0*  MG  --   --   --  2.0  --   PHOS  --   --   --  4.3  --    GFR: Estimated Creatinine Clearance: 43.4 mL/min (A) (by C-G formula based on SCr of 1.45 mg/dL (H)). Liver Function Tests: Recent Labs  Lab 05/14/19 0916 05/18/19 1840 05/19/19  [id number] 05/20/19 0459  AST 64* 52* 72* 35  ALT 119* 40 39 24  ALKPHOS 218* 291* 260* 231*  BILITOT 5.2* 11.7* 11.8* 9.9*  PROT 5.6* 6.2* 5.7* 4.7*  ALBUMIN 1.6* 1.5* 1.4* 1.1*   No results for input(s): LIPASE, AMYLASE in the last 168 hours. Recent Labs  Lab 05/18/19 1827  AMMONIA 62*   Coagulation Profile: Recent Labs  Lab 05/18/19 1840 05/19/19 0601 05/20/19 0459  INR 2.7* 3.1* 2.5*   Cardiac Enzymes: No results for input(s): CKTOTAL, CKMB, CKMBINDEX, TROPONINI in the last 168 hours. BNP (last 3 results) No results for input(s): PROBNP in the last 8760 hours. HbA1C: No results for input(s): HGBA1C in the last 72 hours. CBG: No results for input(s): GLUCAP in the last 168 hours. Lipid Profile: No results for input(s): CHOL, HDL, LDLCALC, TRIG, CHOLHDL, LDLDIRECT in the last 72 hours. Thyroid Function Tests: No results for input(s): TSH, T4TOTAL, FREET4, T3FREE, THYROIDAB in the last 72 hours. Anemia Panel: No results for input(s): VITAMINB12, FOLATE, FERRITIN, TIBC, IRON, RETICCTPCT in the last 72 hours. Sepsis Labs: Recent Labs  Lab 05/18/19 1827 05/18/19 2109  LATICACIDVEN 4.6* 5.1*    No results found for this or any previous visit (from the past 240 hour(s)).   RN Pressure Injury Documentation:     Estimated body mass index is 25.85 kg/m as calculated from the following:   Height as of this encounter: 5\' 3"  (1.6 m).   Weight as of this encounter: 66.2 kg.  Malnutrition Type:      Malnutrition Characteristics:       Nutrition Interventions:    Radiology Studies: No results found.  Scheduled Meds: . Chlorhexidine Gluconate Cloth  6 each Topical Daily  . pantoprazole  40 mg Oral Q0600  . senna-docusate  1 tablet Oral BID  . sodium chloride flush  10-40 mL Intracatheter Q12H  . sodium chloride flush  3 mL Intravenous Q12H   Continuous Infusions: . ceFEPime (MAXIPIME) IV 2 g (05/20/19 0908)  . dextrose 5 % with KCl 20 mEq / L 20 mEq (05/20/19 0925)  . heparin 600 Units/hr (05/20/19 0700)  . phytonadione (VITAMIN K) IV 5 mg (05/20/19 1123)  . potassium chloride      LOS: 2 days   Kerney Elbe, DO [facility] PAGER is on [place]  If 7PM-7AM, please contact night-coverage [email or web address]

## 2019-05-20 NOTE — Progress Notes (Signed)
[place] for Heparin Indication: atrial fibrillation  Allergies  Allergen Reactions  . Gabapentin Other (See Comments)    "Made me feel odd, so I stopped taking it"    Patient Measurements: Height: 5\' 3"  (160 cm) Weight: 145 lb 15.1 oz (66.2 kg) IBW/kg (Calculated) : 52.4 Heparin Dosing Weight: 65.7 kg  Vital Signs: Temp: 98.4 F (36.9 C) (03/25 0400) Temp Source: Oral (03/25 0400) BP: 114/61 (03/25 0400) Pulse Rate: 117 (03/25 0400)  Labs: Recent Labs    05/18/19 1840 05/18/19 1840 05/19/19 0537 05/19/19 0601 05/19/19 1400 05/20/19 0459 05/20/19 0500  HGB 11.9*  --   --  10.6*  --   --   --   HCT 36.5  --   --  32.7*  --   --   --   PLT 177  --   --  124*  --   --   --   APTT 54*   < >  --  130* 135* 101*  --   LABPROT 29.0*  --   --  31.5*  --  26.9*  --   INR 2.7*  --   --  3.1*  --  2.5*  --   HEPARINUNFRC >2.20*  --   --   --   --   --  0.72*  CREATININE 1.59*  --  1.65*  --   --   --   --    < > = values in this interval not displayed.    Estimated Creatinine Clearance: 38.1 mL/min (A) (by C-G formula based on SCr of 1.65 mg/dL (H)).   Medical History: Past Medical History:  Diagnosis Date  . No pertinent past medical history     Medications:  Scheduled:  . Chlorhexidine Gluconate Cloth  6 each Topical Daily  . senna-docusate  1 tablet Oral BID  . sodium chloride flush  10-40 mL Intracatheter Q12H  . sodium chloride flush  3 mL Intravenous Q12H    Assessment: Patient is a 8 yof that is being admitted for metastatic axillary melanoma, hx of DVT, and liver failure. The patient was on apixaban at home however was started on a heparin drip at the transferring hospital. Transferring hospital started Heparin @ 400 units/hr at ~1000.  3/25 AM update:  APTT is therapeutic this AM  Goal of Therapy:  Heparin level 0.3-0.7 units/ml aPTT 66-102 seconds Monitor platelets by anticoagulation protocol: Yes   Plan:   Cont heparin at 600 units/hr 1400 aPTT/HL Should be able to use heparin level only starting 3/26 Monitor for bleeding  Narda Bonds, PharmD, BCPS Clinical Pharmacist Phone: [phone]

## 2019-05-21 DIAGNOSIS — I959 Hypotension, unspecified: Secondary | ICD-10-CM

## 2019-05-21 LAB — PHOSPHORUS: Phosphorus: 6.4 mg/dL — ABNORMAL HIGH (ref 2.5–4.6)

## 2019-05-21 LAB — CBC WITH DIFFERENTIAL/PLATELET
Abs Immature Granulocytes: 2.24 10*3/uL — ABNORMAL HIGH (ref 0.00–0.07)
Basophils Absolute: 0.3 10*3/uL — ABNORMAL HIGH (ref 0.0–0.1)
Basophils Relative: 1 %
Eosinophils Absolute: 0.1 10*3/uL (ref 0.0–0.5)
Eosinophils Relative: 0 %
HCT: 30.9 % — ABNORMAL LOW (ref 36.0–46.0)
Hemoglobin: 9.7 g/dL — ABNORMAL LOW (ref 12.0–15.0)
Immature Granulocytes: 4 %
Lymphocytes Relative: 3 %
Lymphs Abs: 1.9 10*3/uL (ref 0.7–4.0)
MCH: 30.3 pg (ref 26.0–34.0)
MCHC: 31.4 g/dL (ref 30.0–36.0)
MCV: 96.6 fL (ref 80.0–100.0)
Monocytes Absolute: 3.3 10*3/uL — ABNORMAL HIGH (ref 0.1–1.0)
Monocytes Relative: 6 %
Neutro Abs: 49.3 10*3/uL — ABNORMAL HIGH (ref 1.7–7.7)
Neutrophils Relative %: 86 %
Platelets: 108 10*3/uL — ABNORMAL LOW (ref 150–400)
RBC: 3.2 MIL/uL — ABNORMAL LOW (ref 3.87–5.11)
RDW: 22.4 % — ABNORMAL HIGH (ref 11.5–15.5)
WBC: 57.1 10*3/uL (ref 4.0–10.5)
nRBC: 3 % — ABNORMAL HIGH (ref 0.0–0.2)

## 2019-05-21 LAB — MAGNESIUM: Magnesium: 2.1 mg/dL (ref 1.7–2.4)

## 2019-05-21 LAB — COMPREHENSIVE METABOLIC PANEL
ALT: 24 U/L (ref 0–44)
AST: 37 U/L (ref 15–41)
Albumin: 1.1 g/dL — ABNORMAL LOW (ref 3.5–5.0)
Alkaline Phosphatase: 224 U/L — ABNORMAL HIGH (ref 38–126)
Anion gap: 8 (ref 5–15)
BUN: 25 mg/dL — ABNORMAL HIGH (ref 6–20)
CO2: 14 mmol/L — ABNORMAL LOW (ref 22–32)
Calcium: 12.6 mg/dL — ABNORMAL HIGH (ref 8.9–10.3)
Chloride: 122 mmol/L — ABNORMAL HIGH (ref 98–111)
Creatinine, Ser: 1.63 mg/dL — ABNORMAL HIGH (ref 0.44–1.00)
GFR calc Af Amer: 43 mL/min — ABNORMAL LOW (ref >60)
GFR calc non Af Amer: 37 mL/min — ABNORMAL LOW (ref >60)
Glucose, Bld: 78 mg/dL (ref 70–99)
Potassium: 3.6 mmol/L (ref 3.5–5.1)
Sodium: 144 mmol/L (ref 135–145)
Total Bilirubin: 10.3 mg/dL — ABNORMAL HIGH (ref 0.3–1.2)
Total Protein: 4.6 g/dL — ABNORMAL LOW (ref 6.5–8.1)

## 2019-05-21 LAB — HEPARIN LEVEL (UNFRACTIONATED): Heparin Unfractionated: 0.47 IU/mL (ref 0.30–0.70)

## 2019-05-21 MED ORDER — LACTATED RINGERS IV BOLUS
500.0000 mL | Freq: Once | INTRAVENOUS | Status: AC
  Administered 2019-05-21: 500 mL via INTRAVENOUS

## 2019-05-21 MED ORDER — ALBUMIN HUMAN 25 % IV SOLN
12.5000 g | Freq: Once | INTRAVENOUS | Status: AC
  Administered 2019-05-21: 10:00:00 12.5 g via INTRAVENOUS
  Filled 2019-05-21: qty 50

## 2019-05-21 MED ORDER — SODIUM CHLORIDE 0.9 % IV BOLUS
1000.0000 mL | Freq: Once | INTRAVENOUS | Status: AC
  Administered 2019-05-21: 09:00:00 1000 mL via INTRAVENOUS

## 2019-05-21 MED ORDER — HEPARIN SOD (PORK) LOCK FLUSH 100 UNIT/ML IV SOLN
500.0000 [IU] | INTRAVENOUS | Status: AC | PRN
  Administered 2019-05-21: 500 [IU]
  Filled 2019-05-21: qty 5

## 2019-05-21 NOTE — TOC Transition Note (Signed)
Transition of Care (TOC) - CM/SW Discharge Note 05/21/19 - Discharged to [facility] via non-emergency ambulance   Patient Details  Name: Angelica Dunn MRN: [id number] Date of Birth: [birth year]  Transition of Care [facility]) CM/SW Contact:  Sable Feil, LCSW Phone Number: 05/21/2019, 11:02 AM   Clinical Narrative:       Final next level of care: Hospice Medical Facility([facility], [place]) Barriers to Discharge: No Barriers Identified   Patient Goals and CMS Choice Patient states their goals for this hospitalization and ongoing recovery are:: Goal changed from home to hospice facility CMS Medicare.gov Compare Post Acute Care list provided to:: Other (Comment Required)(Patient and family live in [place] and chose [facility]) Choice offered to / list presented to : NA(CSW advised by Hospice [facility] that their facility chosen and mother of patient doing paperwork at 10:30 am)  Discharge Placement              Patient chooses bed at: [facility]) Patient to be transferred to facility by: Non-emergency ambulance - [place] Name of family member notified: Patient's mother aware of discharge and completing admissions paperwork at hospice facility Patient and family notified of of transfer: 05/21/19  Discharge Plan and Services                                   Social Determinants of Health (SDOH) Interventions  No SDOH interventions needed or requested prior to discharge   Readmission Risk Interventions No flowsheet data found.

## 2019-05-21 NOTE — Discharge Summary (Signed)
Physician Discharge Summary  Angelica Dunn [id number] DOB: Oct 25, 1970 DOA: 05/18/2019  PCP: Renaldo Reel, PA  Admit date: 05/18/2019 Discharge date: 05/21/2019  Admitted From: Home Disposition: Residential Hospice   Recommendations for Outpatient Follow-up:  1. Further Care per Hospice Protocol   Home Health: No  Equipment/Devices: None  Discharge Condition: Guarded/Poor  CODE STATUS: DO NOT RESUSCITATE Diet recommendation:   Brief/Interim Summary: HPI per Dr. 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 She underwent R axillary lymph node dissection on 03/31/19 with port placement. Post-operatively, she has had significantly elevated LFTs and also had an incidental R-sided pleural effusion and DVT, on Eliquis. She was hospitalized at [facility] from 3/11-19 with plan for outpatient oncology f/u in 3-4 days and BP reassessment. She returned to [facility] yesterday (3/22) with jaundice, increased fatigue, R breast swelling, and persistent upper and lower extremity edema. She was found to have tachycardia to the 120s with WBC 48.5, increased from 34. LFTs were elevated (Bili 8, INR 8.3 and previously 1.3). CT C/A/P with worsening cancer, pleural effusion, ascites, no apparent source of infection. Her mother reports that she has been primarily failing to thrive with decreased PO intake, increased somnolence, jaundice. The patient complains of RUE pain and swelling. She has intermittent confusion.  ED Course:Given IVF, Cefepime/Vanc. COVID negative. Accepted in transfer to SDU for worsening liver failure.  **Interim History  His mentation has been waxing and waning and continued to worsen today.  She remains sinus tachycardic and she had significant electrolyte abnormalities as she started vomiting today.  Palliative care was consulted as well  gastroenterology and medical oncology.  After patient's mother discussion with all the specialists she decided that the best option would be for the patient to go to residential hospice in [facility].  Patient mother does not want the interventions to be stopped completely today and they want patient to be stable enough to be transferred to [facility] likely tomorrow.  Referral was made to hospice and they do have beds.  Will stop all aggressive care and transfer to Residential Hospice.  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 Burr Medico discussed the potential benefit and side effect from immunotherapy with the patient's mother and the patient and the patient declined any cancer therapy and patient's mother is very reluctant to take any cancer treatment decisions and feels that the patient is too weak to take any type of treatment and feels that she would benefit from comfort care and possible residential hospice after discharge; palliative was consulted and after lengthy discussion with the oncology team and the palliative team the patient's mother like to focus on comfort would like the patient to be discharged to residential hospice unit of [place] as the patient is in the process of transitioning to comfort measures.  Labs have been discontinued due to her transition to comfort measures  however she is  significantly hypokalemic and remains on a heparin drip.  Because the patient's mother does not want to stop all interventions currently we will continue these for now -Dr. Bobby Rumpf the patient's primary oncologist feels that the patient should start immunotherapy soon as possible -Palliative care has been consulted for goals of care discussion -She was accepted in transfer for acute liver failure but this is not her greatest complaint; she complains of significant RUE pain and edema again -She has R chest wall tumor invasion with lymph node invasion and her RUE edema appears to be due to lymphedema -She also had a recent RUE DVT which could be contributing - but she has been on Eliquis since d/c and was started on Heparin drip once her INR was corrected at [facility] and so this would not be expected to worsen if related to the DVT -She also hds worsening LFTs with markedly elevated INR (corrected) and bilirubin steadily worsening (2.2 on 3/16, 2.4 on 3/17, 5.3 on 3/18, 5.6 on 3/19, 7.8 on 3/22...); After vitamin K the liver tests are somewhat better with improvement of her INR slightly  -Unfortunately, this appears to be a terminal condition at this time and hospice seems the most likely outcome for this young woman; palliative care consultation has been requested and occurred today continue treatment with the goal of transferring to hospice for end-of-life care today; Will stop all measures prior to D/C  -Her mother and the patient are in agreement with DNR at this time -Will give IVF, allow her to eat if she is able, and call GI and oncology to see -Gastroenterology Dr. Henrene Pastor has been evaluating her significant GI work-up earlier this month as below and Dr. Henrene Pastor feels that this is most consistent with intrahepatic cholestasis and is not convinced that the patient is in true liver failure and thinks this is drug-related -Oncology recommends considering a liver biopsy to rule out diffuse infiltrative liver  metastasis from her melanoma though this is uncommon in patients mother refuses a biopsy at this time and also refused the possibility of transferring to [facility] -Elevated lactate and marked leukocytosis is not thought to be related to sepsis but will continue Cefepime for possible RLL infiltrate while hospitalized - although this much more likely related to a malignant effusion; she did receive 7 days of IV Cefepime during her last hospitalization as well will continue for now ready to transition to full comfort measures and end-of-life care and hospice -Further Care per Hospice Protocol   Hypotension -Related to worsening condition and patient actively dying -Received and LR bolus and 1 Liter of NS bolus -Will give Albumin prior to D/C  -BP remain Low 70's/40's -Further care per Hospice Protocol   DVT, R axillary vein -Hold Eliquis and Discontinue at D/C -Will treat with Heparin for now and continue for now as above -On MSIR for pain control but will change to IV morphine and would not hesitate to escalate the dose  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 Henrene Pastor saw the patient -Will treat with Lactulose for possible hepatic encephalopathy  -INR was 8.3 at [facility] and was corrected with vitamin K; recheck was 2.5 -Continuing vitamin K for now until she is ready to transition to hospice -Gastroenterology recommending continuing to correct her coagulopathy with IV vitamin K for several days and trend her INR; she will receive IV vitamin K 5 mg daily for  5 days but will stop now  -Oncology recommended liver biopsy to see if this melanoma has metastasized to the liver however likely would not change management and only would be done if her INR can normalize -Continue lactulose while hospitalized   Hypercalcemia -Initially normal calcium but corrected with albumin is high; now calcium  is 12.6 and corrected Ca2+ is 14.9 this AM  -Was getting IV Hydration but is so edematous now -She did receive Zometa during prior hospitalization and I did give her pamidronate the day before yesterday -Oncology recommending 1 dose of pamidronate given her low GFR and IV fluids given this was done yesterday -IVF to stop prior to Transition to Hospice  Constipation -Continue bowel regimen in addition to lactulose  Significant metabolic acidosis -Patient CO2 was 12, chloride level is 116, anion gap is 13; now anion gap is 8, chloride level is 122, and CO2 is 14 -Continue with IV fluid hydration but changed to D5W as above -We will give sodium bicarbonate 1 amp IV  -Continue to monitor and trend -Will not repeat CBC as she is going to Hospice   Leukocytosis/SIRS -Likely in the setting of malignancy -Patient's WBC is now 57.1 and she continues to remain tachycardic -Continue monitor and trend and will continue empiric IV antibiotics with cefepime while hospitalized but will discontinue prior to Transfer to Hospice  -Will not repeat CBC as she is going to Hospice  -Follow-up on cultures  Thrombocytopenia -Patient platelet count is now slightly improved and is now 108,000 We will continue to monitor for signs and symptoms of bleeding; currently no overt bleeding noted -Will not repeat CBC as she is going to Hospice   Normocytic Anemia -Patient's hemoglobin/hematocrit is now 9.7/30.9 -Continue to monitor for signs and symptoms of bleeding; currently no overt bleeding noted -Will not repeat as she is being transitioned to Hospice  Hypokalemia -In the setting of Vomiting.  -Patient's potassium was 2.2 this morning next-replete with IV KCl 60 mEq and then replete again with another 60 mEq; interval check was 2.5 -Continue monitor and replete as necessary -Repeat K+ this AM was 3.6 -Will not repeat CMP as patient is actively dying and transitioning to Hospice for End of Life  Care  Hypernatremia/Hyperchloremia -Patient's sodium was 146 and is now 144 and chloride level was 124 and is now 122 -IV fluid changes as above -Will discontinue Labwork as she is going to Hospice   Goals of Care: Do Not resuscitate present on admission -Patient is extremely high risk for decompensation given that she is actively dying.  Family has elected to transition to comfort care and transition to residential hospice for end-of-life care and has bed available today. Will not be escalating care. Is a high risk of possible in-hospital death if she does not get transferred to a residential hospice in the next 24 to 48 hours given her extremely poor prognosis and aggressive metastatic melanoma.  Patient has had a coagulopathy secondary to a drug induced liver injury and has had significant tachycardia since being admitted which is not improved. Patient to be transferred to Residential Hospice today.   Discharge Instructions  Discharge Instructions    Call MD for:  difficulty breathing, headache or visual disturbances   Complete by: As directed    Call MD for:  extreme fatigue   Complete by: As directed    Call MD for:  hives   Complete by: As directed    Call MD for:  persistant dizziness or light-headedness  Complete by: As directed    Call MD for:  persistant nausea and vomiting   Complete by: As directed    Call MD for:  redness, tenderness, or signs of infection (pain, swelling, redness, odor or green/yellow discharge around incision site)   Complete by: As directed    Call MD for:  severe uncontrolled pain   Complete by: As directed    Call MD for:  temperature >100.4   Complete by: As directed    Diet general   Complete by: As directed    Discharge instructions   Complete by: As directed    Further Care Per Hospice Protocol   Increase activity slowly   Complete by: As directed      Allergies as of 05/21/2019      Reactions   Gabapentin Other (See Comments)   "Made  me feel odd, so I stopped taking it"      Medication List    STOP taking these medications   amitriptyline 10 MG tablet Commonly known as: ELAVIL   Eliquis DVT/PE Starter Pack 5 MG Tbpk Generic drug: Apixaban Starter Pack   ezetimibe 10 MG tablet Commonly known as: ZETIA   feeding supplement (ENSURE ENLIVE) Liqd   fexofenadine 180 MG tablet Commonly known as: ALLEGRA   lisinopril 20 MG tablet Commonly known as: ZESTRIL   magnesium hydroxide 400 MG/5ML suspension Commonly known as: MILK OF MAGNESIA   morphine 15 MG tablet Commonly known as: MSIR   multivitamin with minerals Tabs tablet   Nasacort Allergy 24HR 55 MCG/ACT Aero nasal inhaler Generic drug: triamcinolone   ondansetron 4 MG disintegrating tablet Commonly known as: ZOFRAN-ODT   polyethylene glycol 17 g packet Commonly known as: MIRALAX / GLYCOLAX   senna-docusate 8.6-50 MG tablet Commonly known as: Senokot-S       Allergies  Allergen Reactions  . Gabapentin Other (See Comments)    "Made me feel odd, so I stopped taking it"   Consultations:  Hospice  Gastroenterology  Palliative Care Medicine  Medical Oncology  Procedures/Studies: CT Angio Chest PE W and/or Wo Contrast  Result Date: 05/07/2019 CLINICAL DATA:  History of melanoma. Axillary biopsy 5 weeks ago. Pain, swelling and inability to raise the right arm. No chest pain or shortness of breath. EXAM: CT ANGIOGRAPHY CHEST WITH CONTRAST TECHNIQUE: Multidetector CT imaging of the chest was performed using the standard protocol during bolus administration of intravenous contrast. Multiplanar CT image reconstructions and MIPs were obtained to evaluate the vascular anatomy. CONTRAST:  60 cc OMNIPAQUE IOHEXOL 350 MG/ML SOLN COMPARISON:  Chest CT 04/28/2019 FINDINGS: Cardiovascular: The heart is normal in size. No pericardial effusion. The aorta is normal in caliber. No dissection. The branch vessels are patent. No coronary artery calcifications. The  pulmonary arterial tree is well opacified. No filling defects to suggest pulmonary embolism. Mediastinum/Nodes: Small scattered mediastinal and hilar lymph nodes are stable. No mass or overt adenopathy. The esophagus is grossly normal. The thyroid gland is normal. Lungs/Pleura: New small right pleural effusion with overlying atelectasis. No focal infiltrates or worrisome pulmonary nodules. Upper Abdomen: No significant upper abdominal findings. Chest wall/musculoskeletal: Extensive and progressive infiltrating tumor in the right axilla and invading the right chest wall. This measures a maximum of 10.2 x 6.2 cm on image 47/6. This previously measured 8.1 x 3.4 cm. Tumor is now bulging the pleura. Do not see any definite direct bony invasion or destruction. Suspect direct invasion of the latissimus dorsi, serratus anterior and subscapularis muscles. Review of the  MIP images confirms the above findings. IMPRESSION: 1. No CT findings for pulmonary embolism. 2. Extensive, aggressive and progressive infiltrating tumor in the right axilla. This is invading the right chest wall and involving the right chest wall musculature as above. 3. No mediastinal or hilar mass or adenopathy. 4. New small right pleural effusion with overlying atelectasis. No definite metastatic pulmonary nodules. Electronically Signed   By: Marijo Sanes M.D.   On: 05/07/2019 07:55   MR BRAIN W WO CONTRAST  Result Date: 05/11/2019 CLINICAL DATA:  Melanoma, staging EXAM: MRI HEAD WITHOUT AND WITH CONTRAST TECHNIQUE: Multiplanar, multiecho pulse sequences of the brain and surrounding structures were obtained without and with intravenous contrast. CONTRAST:  42mL GADAVIST GADOBUTROL 1 MMOL/ML IV SOLN COMPARISON:  None. FINDINGS: Brain: There is no acute infarction or intracranial hemorrhage. There is no intracranial mass, mass effect, or edema. There is no hydrocephalus or extra-axial fluid collection. No abnormal enhancement. Vascular: Major vessel  flow voids at the skull base are preserved. Skull and upper cervical spine: No focal suspicious osseous lesion. Sinuses/Orbits: Paranasal sinuses are aerated. Orbits are unremarkable. Other: Sella is unremarkable. Minor left mastoid fluid opacification. IMPRESSION: No evidence of intracranial metastatic disease. Electronically Signed   By: Macy Mis M.D.   On: 05/11/2019 12:13   DG CHEST PORT 1 VIEW  Result Date: 05/13/2019 CLINICAL DATA:  Shortness of breath. EXAM: PORTABLE CHEST 1 VIEW COMPARISON:  March 31, 2019. FINDINGS: The heart size and mediastinal contours are within normal limits. No pneumothorax. Left lung is clear. Left subclavian Port-A-Cath is noted. Increased right basilar atelectasis or infiltrate is noted with possible small pleural effusion. The visualized skeletal structures are unremarkable. IMPRESSION: Increased right basilar atelectasis or infiltrate is noted with possible small pleural effusion. Electronically Signed   By: Marijo Conception M.D.   On: 05/13/2019 08:20   MR ABDOMEN MRCP W WO CONTAST  Result Date: 05/07/2019 CLINICAL DATA:  Acute cholangitis. Mild biliary ductal dilatation. Leukocytosis. Melanoma and right breast carcinoma. EXAM: MRI ABDOMEN WITHOUT AND WITH CONTRAST (INCLUDING MRCP) TECHNIQUE: Multiplanar multisequence MR imaging of the abdomen was performed both before and after the administration of intravenous contrast. Heavily T2-weighted images of the biliary and pancreatic ducts were obtained, and three-dimensional MRCP images were rendered by post processing. CONTRAST:  5.29mL GADAVIST GADOBUTROL 1 MMOL/ML IV SOLN COMPARISON:  Ultrasound on 05/06/2019 and CT on 04/28/2019 FINDINGS: Lower chest: Small right pleural effusion. Subcutaneous edema in right chest and abdominal wall. Small fluid collection in right breast, likely representing a postop seroma. Hepatobiliary: No evidence of hepatic mass or abscess. Unremarkable appearance of gallbladder. Common bile  duct measures 8 mm in diameter. No evidence of common bile duct wall thickening or enhancement. No evidence of choledocholithiasis or biliary stricture. Pancreas: No mass or inflammatory changes. No evidence of pancreatic ductal dilatation or pancreas divisum. Spleen:  Within normal limits in size and appearance. Adrenals/Urinary Tract: No masses identified. No evidence of hydronephrosis. Stomach/Bowel: Visualized portion unremarkable. Vascular/Lymphatic: No pathologically enlarged lymph nodes identified. No abdominal aortic aneurysm. Other:  None. Musculoskeletal:  No suspicious bone lesions identified. IMPRESSION: 1. No evidence of biliary obstruction or choledocholithiasis. 2. No evidence of hepatic abscess or mass. 3. Small right pleural effusion, and right chest and abdominal wall edema. Probable postop seroma in the right breast. Electronically Signed   By: Marlaine Hind M.D.   On: 05/07/2019 08:05   VAS [place] LOWER EXTREMITY VENOUS (DVT)  Result Date: 05/10/2019  Lower Venous DVTStudy Indications: Swelling.  Risk Factors: None identified. Comparison Study: No prior studies. Performing Technologist: Oliver Hum RVT  Examination Guidelines: A complete evaluation includes B-mode imaging, spectral Doppler, color Doppler, and power Doppler as needed of all accessible portions of each vessel. Bilateral testing is considered an integral part of a complete examination. Limited examinations for reoccurring indications may be performed as noted. The reflux portion of the exam is performed with the patient in reverse Trendelenburg.  +---------+---------------+---------+-----------+----------+--------------+ RIGHT    CompressibilityPhasicitySpontaneityPropertiesThrombus Aging +---------+---------------+---------+-----------+----------+--------------+ CFV      Full           Yes      Yes                                 +---------+---------------+---------+-----------+----------+--------------+ SFJ       Full                                                        +---------+---------------+---------+-----------+----------+--------------+ FV Prox  Full                                                        +---------+---------------+---------+-----------+----------+--------------+ FV Mid   Full                                                        +---------+---------------+---------+-----------+----------+--------------+ FV DistalFull                                                        +---------+---------------+---------+-----------+----------+--------------+ PFV      Full                                                        +---------+---------------+---------+-----------+----------+--------------+ POP      Full           Yes      Yes                                 +---------+---------------+---------+-----------+----------+--------------+ PTV      Full                                                        +---------+---------------+---------+-----------+----------+--------------+ PERO     Full                                                        +---------+---------------+---------+-----------+----------+--------------+   +---------+---------------+---------+-----------+----------+--------------+  LEFT     CompressibilityPhasicitySpontaneityPropertiesThrombus Aging +---------+---------------+---------+-----------+----------+--------------+ CFV      Full           Yes      Yes                                 +---------+---------------+---------+-----------+----------+--------------+ SFJ      Full                                                        +---------+---------------+---------+-----------+----------+--------------+ FV Prox  Full                                                        +---------+---------------+---------+-----------+----------+--------------+ FV Mid   Full                                                         +---------+---------------+---------+-----------+----------+--------------+ FV DistalFull                                                        +---------+---------------+---------+-----------+----------+--------------+ PFV      Full                                                        +---------+---------------+---------+-----------+----------+--------------+ POP      Full           Yes      Yes                                 +---------+---------------+---------+-----------+----------+--------------+ PTV      Full                                                        +---------+---------------+---------+-----------+----------+--------------+ PERO     Full                                                        +---------+---------------+---------+-----------+----------+--------------+     Summary: RIGHT: - There is no evidence of deep vein thrombosis in the lower extremity.  - No cystic structure found in the popliteal fossa.  LEFT: - There is no evidence of deep vein thrombosis in the lower extremity.  - No  cystic structure found in the popliteal fossa.  *See table(s) above for measurements and observations. Electronically signed by Ruta Hinds MD on 05/10/2019 at 7:07:53 PM.    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 Extensive, aggressive and progressive infiltrating tumor in the right axilla. Limitations: Edema, pain with movement of arm. Comparison Study: No prior study on file Performing Technologist: Sharion Dove RVS  Examination Guidelines: A complete evaluation includes B-mode imaging, spectral Doppler, color Doppler, and power Doppler as needed of all accessible portions of each vessel. Bilateral testing is considered an integral part of a complete examination. Limited examinations for reoccurring indications may be performed as  noted.  Right Findings: +----------+------------+---------+-----------+----------+--------------+ RIGHT     CompressiblePhasicitySpontaneousProperties   Summary     +----------+------------+---------+-----------+----------+--------------+ IJV           Full       Yes       Yes                             +----------+------------+---------+-----------+----------+--------------+ Subclavian               Yes       Yes                             +----------+------------+---------+-----------+----------+--------------+ Axillary                 Yes       Yes                             +----------+------------+---------+-----------+----------+--------------+ Brachial    Partial                                     Acute      +----------+------------+---------+-----------+----------+--------------+ Radial        None                                      Acute      +----------+------------+---------+-----------+----------+--------------+ Ulnar         None                                      Acute      +----------+------------+---------+-----------+----------+--------------+ Cephalic                                            Not visualized +----------+------------+---------+-----------+----------+--------------+ Basilic                                             Not visualized +----------+------------+---------+-----------+----------+--------------+  Left Findings: +----------+------------+---------+-----------+----------+-------+ LEFT      CompressiblePhasicitySpontaneousPropertiesSummary +----------+------------+---------+-----------+----------+-------+ Subclavian               Yes       Yes                      +----------+------------+---------+-----------+----------+-------+  Summary:  Right: Findings consistent with acute deep vein thrombosis involving the right brachial veins, right radial veins and right ulnar veins. However, unable to  visualize the cephalic and basilic veins. Anechoic area in axillary region of chest. Possible lymph node.  Left: No evidence of thrombosis in the subclavian.  *See table(s) above for measurements and observations.  Diagnosing physician: Ruta Hinds MD Electronically signed by Ruta Hinds MD on 05/10/2019 at 7:16:31 PM.    Final    US Abdomen Limited RUQ  Result Date: 05/06/2019 CLINICAL DATA:  Right upper quadrant abdominal pain. EXAM: ULTRASOUND ABDOMEN LIMITED RIGHT UPPER QUADRANT COMPARISON:  CT 04/28/2019 at [place]: Gallbladder: Distended. No gallstones or wall thickening visualized. No sonographic Murphy sign noted by sonographer. Common bile duct: Diameter: Dilated measuring 10 mm at the porta hepatis, 8 mm distally. Liver: No focal lesion identified. Within normal limits in parenchymal echogenicity. Portal vein is patent on color Doppler imaging with normal direction of blood flow towards the liver. Other: Right pleural effusion, not seen on recent CT. IMPRESSION: 1. Biliary dilatation with common bile duct measuring 10 mm. This is new from CT 1 week ago. Recommend further evaluation with MRCP or ERCP. 2. Gallbladder is mildly distended, but otherwise normal. No gallstones. 3. Right pleural effusion incidentally noted, new from prior CT. Electronically Signed   By: Keith Rake M.D.   On: 05/06/2019 20:50    Subjective: Seen and examined at beside and is awake but confused significantly. BP was low overnight. She is transitioning [facility] and has a bed available today.   Discharge Exam: Vitals:   05/21/19 0800 05/21/19 0900  BP: (!) 73/46 (!) 70/42  Pulse: (!) 116 (!) 116  Resp: (!) 21 20  Temp:    SpO2: 95% 94%   Vitals:   05/21/19 0700 05/21/19 0741 05/21/19 0800 05/21/19 0900  BP: (!) 68/42 (!) 73/42 (!) 73/46 (!) 70/42  Pulse: (!) 113 (!) 115 (!) 116 (!) 116  Resp: (!) 23 (!) 21 (!) 21 20  Temp:  98.2 F (36.8 C)    TempSrc:   Axillary    SpO2: 94% 96% 95% 94%  Weight:      Height:       General: Pt is awake but not alert, not in acute distress but does appear uncomfortable Cardiovascular: Tachycardic Rate, S1/S2 +, no rubs, no gallops Respiratory: Diminished bilaterally, no wheezing, no rhonchi Abdominal: Soft, NT, Distended 2/2 body habitus, bowel sounds + Extremities: Extremely edematous   The results of significant diagnostics from this hospitalization (including imaging, microbiology, ancillary and laboratory) are listed below for reference.    Microbiology: No results found for this or any previous visit (from the past 240 hour(s)).   Labs: BNP (last 3 results) No results for input(s): BNP in the last 8760 hours. Basic Metabolic Panel: Recent Labs  Lab 05/18/19 1840 05/19/19 0537 05/20/19 0459 05/20/19 1246 05/21/19 0403  NA 139 141 146* 145 144  K 2.6* 4.4 2.2* 2.5* 3.6  CL 114* 116* 124* 124* 122*  CO2 14* 12* 15* 13* 14*  GLUCOSE 105* 78 91 90 78  BUN 19 17 18 18  25*  CREATININE 1.59* 1.65* 1.44* 1.45* 1.63*  CALCIUM 10.6* 10.6* 11.6* 12.0* 12.6*  MG  --   --  2.0  --  2.1  PHOS  --   --  4.3  --  6.4*   Liver Function Tests: Recent Labs  Lab 05/18/19 1840  05/19/19 0537 05/20/19 0459 05/21/19 0403  AST 52* 72* 35 37  ALT 40 39 24 24  ALKPHOS 291* 260* 231* 224*  BILITOT 11.7* 11.8* 9.9* 10.3*  PROT 6.2* 5.7* 4.7* 4.6*  ALBUMIN 1.5* 1.4* 1.1* 1.1*   No results for input(s): LIPASE, AMYLASE in the last 168 hours. Recent Labs  Lab 05/18/19 1827  AMMONIA 62*   CBC: Recent Labs  Lab 05/18/19 1840 05/19/19 0601 05/20/19 0459 05/21/19 0403  WBC 61.8* 60.9* 60.3* 57.1*  NEUTROABS 53.1*  --  56.7* 49.3*  HGB 11.9* 10.6* 9.9* 9.7*  HCT 36.5 32.7* 31.5* 30.9*  MCV 91.5 93.4 94.9 96.6  PLT 177 124* 130* 108*   Cardiac Enzymes: No results for input(s): CKTOTAL, CKMB, CKMBINDEX, TROPONINI in the last 168 hours. BNP: Invalid input(s): POCBNP CBG: No results for  input(s): GLUCAP in the last 168 hours. D-Dimer No results for input(s): DDIMER in the last 72 hours. Hgb A1c No results for input(s): HGBA1C in the last 72 hours. Lipid Profile No results for input(s): CHOL, HDL, LDLCALC, TRIG, CHOLHDL, LDLDIRECT in the last 72 hours. Thyroid function studies No results for input(s): TSH, T4TOTAL, T3FREE, THYROIDAB in the last 72 hours.  Invalid input(s): FREET3 Anemia work up No results for input(s): VITAMINB12, FOLATE, FERRITIN, TIBC, IRON, RETICCTPCT in the last 72 hours. Urinalysis    Component Value Date/Time   COLORURINE STRAW (A) 05/07/2019 0122   APPEARANCEUR CLEAR 05/07/2019 0122   LABSPEC 1.009 05/07/2019 0122   PHURINE 6.0 05/07/2019 0122   GLUCOSEU NEGATIVE 05/07/2019 0122   HGBUR NEGATIVE 05/07/2019 0122   BILIRUBINUR NEGATIVE 05/07/2019 0122   KETONESUR NEGATIVE 05/07/2019 0122   PROTEINUR NEGATIVE 05/07/2019 0122   NITRITE NEGATIVE 05/07/2019 0122   LEUKOCYTESUR NEGATIVE 05/07/2019 0122   Sepsis Labs Invalid input(s): PROCALCITONIN,  WBC,  LACTICIDVEN Microbiology No results found for this or any previous visit (from the past 240 hour(s)).  Time coordinating discharge: 35 minutes  SIGNED:  Kerney Elbe, DO [facility] 05/21/2019, 10:17 AM Pager is on [place]  If 7PM-7AM, please contact night-coverage [email or web address] Password [email or web address]

## 2019-05-21 NOTE — Progress Notes (Signed)
[place] for Heparin Indication: atrial fibrillation  Allergies  Allergen Reactions  . Gabapentin Other (See Comments)    "Made me feel odd, so I stopped taking it"    Patient Measurements: Height: 5\' 3"  (160 cm) Weight: 145 lb 15.1 oz (66.2 kg) IBW/kg (Calculated) : 52.4 Heparin Dosing Weight: 65.7 kg  Vital Signs: Temp: 97.7 F (36.5 C) (03/26 0342) Temp Source: Axillary (03/26 0342) BP: 74/41 (03/26 0541) Pulse Rate: 117 (03/26 0541)  Labs: Recent Labs    05/18/19 1840 05/19/19 0537 05/19/19 0601 05/19/19 1400 05/20/19 0459 05/20/19 0500 05/20/19 0840 05/20/19 1245 05/20/19 1246 05/21/19 0403  HGB 11.9*  --  10.6*  --  9.9*  --   --   --   --  9.7*  HCT 36.5  --  32.7*  --  31.5*  --   --   --   --  30.9*  PLT 177  --  124*  --  130*  --   --   --   --  108*  APTT 54*  --  130*   < > 101*  --  69* 92*  --   --   LABPROT 29.0*  --  31.5*  --  26.9*  --   --   --   --   --   INR 2.7*  --  3.1*  --  2.5*  --   --   --   --   --   HEPARINUNFRC >2.20*  --   --   --   --    < > 0.66 0.62  --  0.47  CREATININE 1.59*   < >  --   --  1.44*  --   --   --  1.45* 1.63*   < > = values in this interval not displayed.    Estimated Creatinine Clearance: 38.6 mL/min (A) (by C-G formula based on SCr of 1.63 mg/dL (H)).   Medical History: Past Medical History:  Diagnosis Date  . No pertinent past medical history     Medications:  Scheduled:  . Chlorhexidine Gluconate Cloth  6 each Topical Daily  . pantoprazole  40 mg Oral Q0600  . senna-docusate  1 tablet Oral BID  . sodium chloride flush  10-40 mL Intracatheter Q12H  . sodium chloride flush  3 mL Intravenous Q12H    Assessment: Patient is a 51 yof that is being admitted for metastatic axillary melanoma, hx of DVT, and liver failure. The patient was on apixaban at home however was started on a heparin drip at the transferring hospital.   Heparin level remains therapeutic this  AM, no bleeding reported.  Goal of Therapy:  Heparin level 0.3-0.7 units/ml aPTT 66-102 seconds Monitor platelets by anticoagulation protocol: Yes   Plan:  Continue heparin gtt at 600 units/hr Daily heparin level, CBC, s/s bleeding  Bertis Ruddy, PharmD Clinical Pharmacist Please check AMION for all [place] numbers 05/21/2019 6:59 AM

## 2019-05-21 NOTE — Progress Notes (Addendum)
Spoke with Pt's mother, Ms. Shaune Spittle by phone, updated given, all questions answered. She expressed appreciations and understood the plan to transfer Pt at am to [facility].  Pt appeared lethargic, drowsy, confused and no interactions with staff, non-verbal. Spontaneously opened her eyes when giving nursing care. Morphine 4 mg IV given for pain twice tonight. Bilateral arms 3+ pitting edema and completely immobile.  Potassium replacement total 120 mEq in the past 24 hours. Repeated BMP at 0403 am, K 3.6 (from 2.5)   EKG sinus tachycardia on monitor,  HR 110s-120s,  trending down BP 70/43- 99/ 61 mmHg.  RR 20-24,  SPO2 95- 100% on room air, Temp  97.7  Axillary,    Urine out put 1000 ml in 24 hours. I/O + 2130 ml  Notified on call provider, Lang Snow, NP. Order received for Lactated ringer 500 ml bolus. We will continue to monitor.   Kennyth Lose, RN

## 2019-05-27 DEATH — deceased

## 2020-05-01 ENCOUNTER — Other Ambulatory Visit: Payer: Self-pay | Admitting: Physician Assistant

## 2020-05-01 DIAGNOSIS — R7989 Other specified abnormal findings of blood chemistry: Secondary | ICD-10-CM

## 2020-08-21 IMAGING — MR MR HEAD WO/W CM
13 of 16 series · 34 of 48 positions shown · IV contrast (gadavist)
Comparison: None.

CLINICAL DATA: Melanoma, staging

EXAM:
MRI HEAD WITHOUT AND WITH CONTRAST
TECHNIQUE: Multiplanar, multiecho pulse sequences of the brain and surrounding
structures were obtained without and with intravenous contrast.
CONTRAST:  5mL GADAVIST GADOBUTROL 1 MMOL/ML IV SOLN

[Series 5: DWI · axial · 3.0mm · 0.88mm/px · z∈[-105,+28]mm · 6 of 92 slices shown (1 of 4)]
[im 1/92]
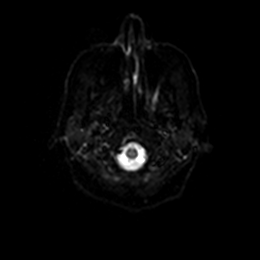
[im 19/92]
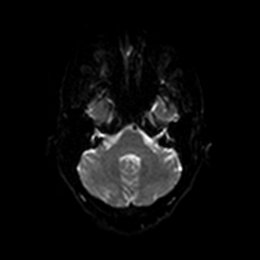
[im 37/92]
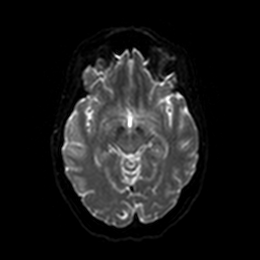
[im 55/92]
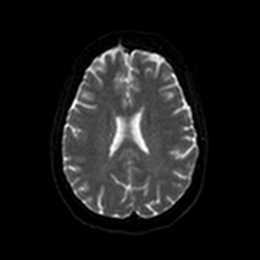
[im 73/92]
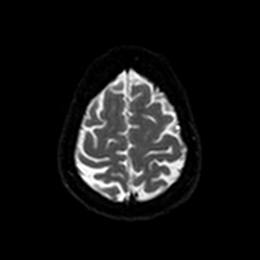
[im 92/92]
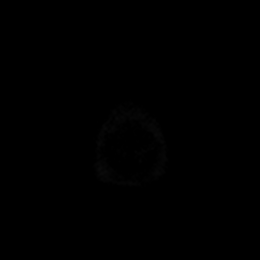

[Series 6: DWI · axial · 3.0mm · 0.88mm/px · z∈[-105,+28]mm · 3 of 46 slices shown (2 of 4)]
[im 1/46]
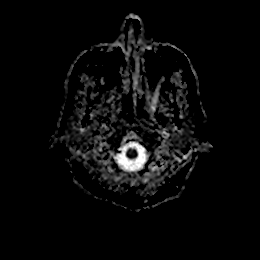
[im 23/46]
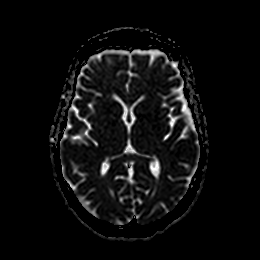
[im 46/46]
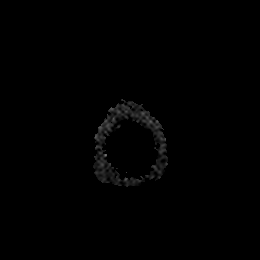

[Series 7: DWI · coronal · 4.0mm · 0.88mm/px · 4 of 68 slices shown (3 of 4)]
[im 1/68]
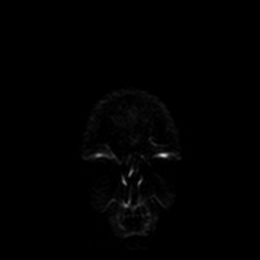
[im 23/68]
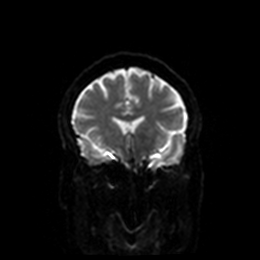
[im 45/68]
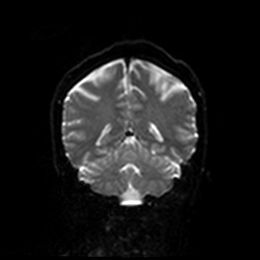
[im 68/68]
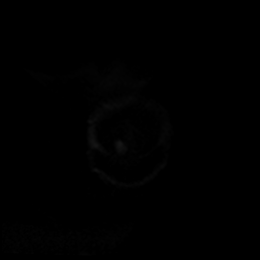

[Series 8: DWI · coronal · 4.0mm · 0.88mm/px · 2 of 34 slices shown (4 of 4)]
[im 1/34]
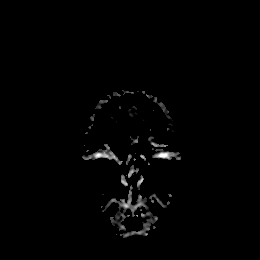
[im 34/34]
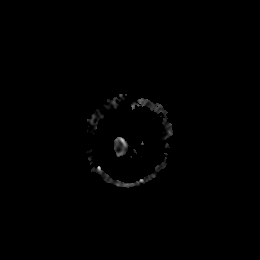

[Series 9: T1 · sagittal · 5.0mm · 0.75mm/px · 1 of 23 slices shown]
[im 1/23]
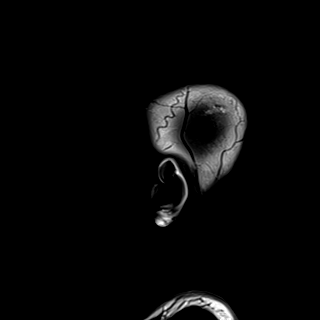

[Series 10: T2 · axial · 5.0mm · 0.72mm/px · z∈[-115,+26]mm · 2 of 25 slices shown]
[im 1/25]
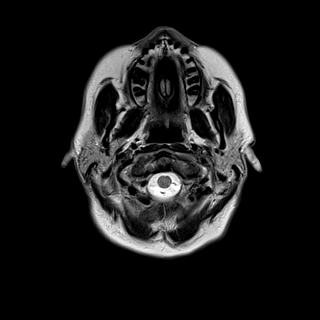
[im 25/25]
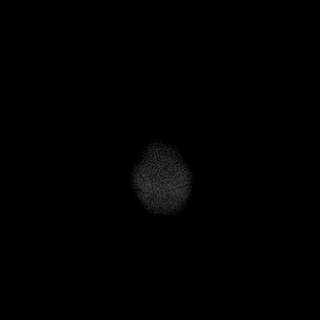

[Series 11: FLAIR · axial · 5.0mm · 0.45mm/px · z∈[-112,+28]mm · 2 of 25 slices shown]
[im 1/25]
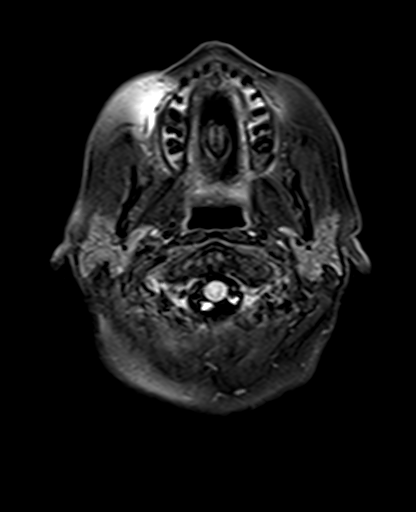
[im 25/25]
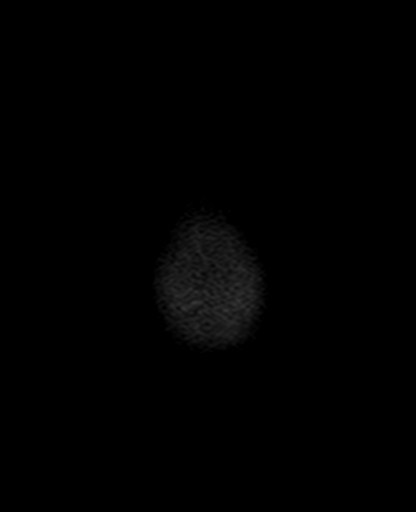

[Series 12: mag_images · axial · 3.0mm · 0.90mm/px · z∈[-112,+39]mm · 3 of 52 slices shown]
[im 1/52]
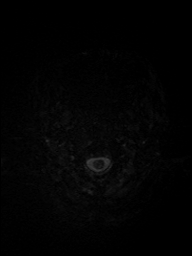
[im 26/52]
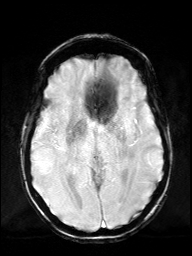
[im 52/52]
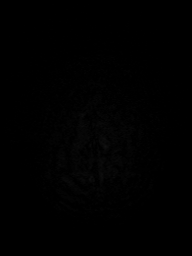

[Series 13: pha_images · axial · 3.0mm · 0.90mm/px · z∈[-112,+39]mm · 3 of 52 slices shown]
[im 1/52]
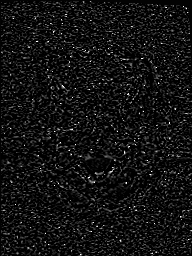
[im 26/52]
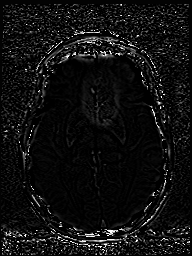
[im 52/52]
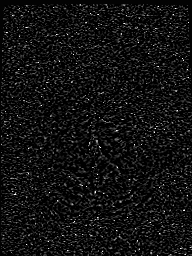

[Series 14: swi_images · axial · 3.0mm · 0.90mm/px · z∈[-112,+39]mm · 3 of 52 slices shown]
[im 1/52]
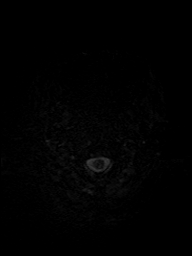
[im 26/52]
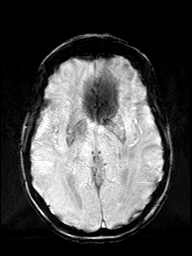
[im 52/52]
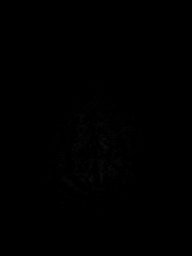

[Series 17: T2 post-contrast · coronal · 5.0mm · 0.72mm/px · 2 of 30 slices shown]
[im 1/30]
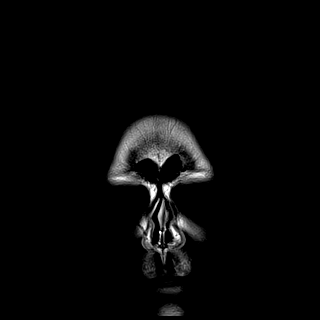
[im 30/30]
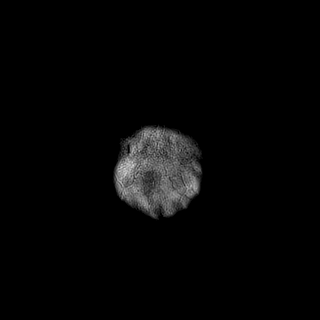

[Series 19: T1 post-contrast · coronal · 5.0mm · 0.34mm/px · 2 of 30 slices shown (1 of 2)]
[im 1/30]
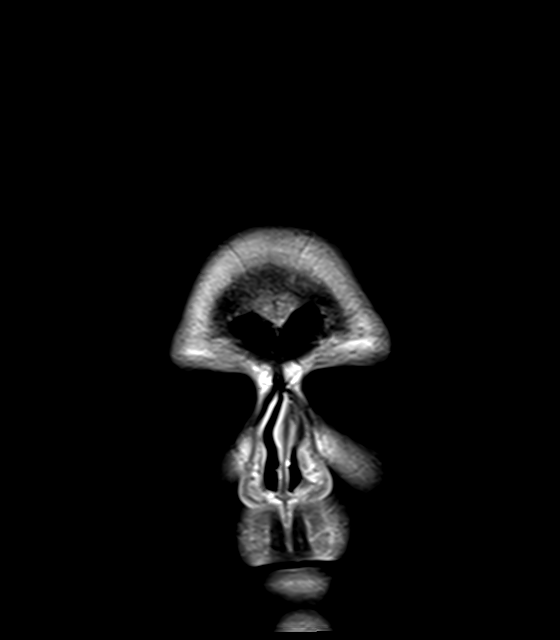
[im 30/30]
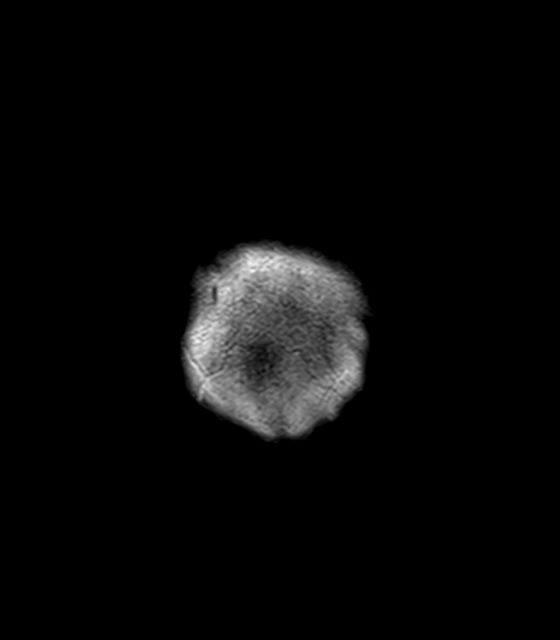

[Series 20: T1 post-contrast · sagittal · 5.0mm · 0.72mm/px · 1 of 23 slices shown (2 of 2)]
[im 1/23]
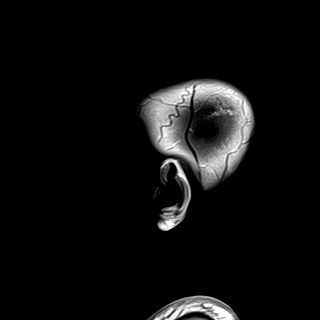

[34 of 48 positions shown; findings below may reference images not displayed]

FINDINGS: Brain: There is no acute infarction or intracranial hemorrhage.
There is no intracranial mass, mass effect, or edema. There is no
hydrocephalus or extra-axial fluid collection. No abnormal
enhancement.

Vascular: Major vessel flow voids at the skull base are preserved.

Skull and upper cervical spine: No focal suspicious osseous lesion.

Sinuses/Orbits: Paranasal sinuses are aerated. Orbits are
unremarkable.

Other: Sella is unremarkable. Minor left mastoid fluid
opacification.
IMPRESSION: No evidence of intracranial metastatic disease.
